# Patient Record
Sex: Female | Born: 1985 | Race: Black or African American | Hispanic: No | Marital: Single | Smoking: Current every day smoker
Health system: Southern US, Community
[De-identification: ages and names within clinical notes are randomized; demographics above are authoritative.]

## PROBLEM LIST (undated history)

## (undated) DIAGNOSIS — K8000 Calculus of gallbladder with acute cholecystitis without obstruction: Secondary | ICD-10-CM

## (undated) DIAGNOSIS — R002 Palpitations: Secondary | ICD-10-CM

## (undated) DIAGNOSIS — F329 Major depressive disorder, single episode, unspecified: Secondary | ICD-10-CM

## (undated) DIAGNOSIS — I10 Essential (primary) hypertension: Secondary | ICD-10-CM

## (undated) DIAGNOSIS — F32A Depression, unspecified: Secondary | ICD-10-CM

## (undated) HISTORY — DX: Calculus of gallbladder with acute cholecystitis without obstruction: K80.00

## (undated) HISTORY — DX: Depression, unspecified: F32.A

## (undated) HISTORY — DX: Major depressive disorder, single episode, unspecified: F32.9

## (undated) HISTORY — DX: Palpitations: R00.2

## (undated) HISTORY — PX: NO PAST SURGERIES: SHX2092

---
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
## 2006-01-29 ENCOUNTER — Ambulatory Visit (HOSPITAL_COMMUNITY): Admission: RE | Admit: 2006-01-29 | Discharge: 2006-01-29 | Payer: Self-pay | Admitting: Family Medicine

## 2006-05-04 ENCOUNTER — Emergency Department (HOSPITAL_COMMUNITY): Admission: EM | Admit: 2006-05-04 | Discharge: 2006-05-05 | Payer: Self-pay | Admitting: Emergency Medicine

## 2006-05-05 ENCOUNTER — Emergency Department (HOSPITAL_COMMUNITY): Admission: EM | Admit: 2006-05-05 | Discharge: 2006-05-06 | Payer: Self-pay | Admitting: Emergency Medicine

## 2006-08-26 ENCOUNTER — Inpatient Hospital Stay (HOSPITAL_COMMUNITY): Admission: AD | Admit: 2006-08-26 | Discharge: 2006-08-29 | Payer: Self-pay | Admitting: Obstetrics and Gynecology

## 2010-07-19 NOTE — H&P (Signed)
NAMEMORINE, KOHLMAN           ACCOUNT NO.:  [id number]   MEDICAL RECORD NO.:  [id number]          PATIENT TYPE:  INP   LOCATION:  A402                          FACILITY:  [facility]   PHYSICIAN:  Tilda Burrow, M.D. DATE OF BIRTH:  [birth year]   DATE OF ADMISSION:  08/26/2006  DATE OF DISCHARGE:  LH                              HISTORY & PHYSICAL   ADMITTING DIAGNOSES:  1. Pregnancy, 38 weeks 6 days.  2. Prodromal labor symptoms, admitted for induction of labor, elective      as per patient request.   HPI:  This is a 25 year old female gravida 1, para 0, LMP January 28, 2006, placing [facility] November 05, 2006, with corrected [facility] based on 9 week  ultrasound of September 03, 2006, at 9 weeks 2 days and confirmed with her 21  week ultrasound suggesting an [facility] of August 27, 2006.  She is 39 weeks by  best criteria, 40 weeks by secondary criteria.  She is admitted after  presenting complaining of contractions and pelvic pressure.  The cervix  has softened up over the past 3 days since last prenatal visit.  Patient  is desirous of delivering at this time and given the cervical changes  and the pressure symptoms that she is having we agreed to proceed with  induction of labor.  The patient is aware that the usual complications  of labor can occur with spontaneous or induced labors.   PAST MEDICAL HISTORY:  1. Positive for anemia.  2. History of abnormal Pap.   SURGICAL HISTORY:  Negative.   ALLERGIES:  None.   SOCIAL HISTORY:  1. Single.  2. Works at [employer] in [place].   Height 5 feet 7 inches, weight 308, which is a 10 pound weight gain  during the pregnancy.  Blood pressure 126/90, fundal height term sized  fetus, estimated fetal weight 7-1/2 to 8 pounds, cervix fingertip, long,  -2, vertex well applied and softer than last week.   PRENATAL LABS:  Blood type O positive, urine drug screen negative,  rubella immune.  Hemoglobin 12, hematocrit 37.  Hepatitis, HIV, RPR, GC  and  Chlamydia all negative.  Pap smear showed LSIL with negative  colposcopy.  She is Group B Strep positive.  Has a 130 mg/dL glucose  tolerance test and is Sickledex negative.   IMPRESSION:  1. Pregnancy, 38 weeks 6 days.  2. Elective induction of labor.   PLAN:  1. Foley bulb on the afternoon of August 26, 2006.  2. Will consider proceeding with Pitocin on the evening of June 22 or      early on August 27, 2006.   ADDENDUM:  Patient has a boy.  Plans to bottle feed.  Plans Depo-Provera  and __________ , her partner, is not currently with her.      Tilda Burrow, M.D.  Electronically Signed     JVF/MEDQ  D:  08/27/2006  T:  08/27/2006  Job:  [id number]   cc:   Francoise Schaumann. Milford Cage DO, FAAP  Fax: [phone]

## 2010-07-19 NOTE — Op Note (Signed)
Kimberly Patrick, SWIER           ACCOUNT NO.:  [id number]   MEDICAL RECORD NO.:  [id number]          PATIENT TYPE:  INP   LOCATION:  LDR4                          FACILITY:  [facility]   PHYSICIAN:  Tilda Burrow, M.D. DATE OF BIRTH:  17-Apr-1985   DATE OF PROCEDURE:  08/27/2006  DATE OF DISCHARGE:                               OPERATIVE REPORT   DELIVERY TIME:  1:15 p.m. approximately.   Iliani progressed through the labor without an epidural.  She had Foley  bulb through the night and Pitocin begun at 4 a.m.  The Foley bulb had  stretched the cervix 4 cm.  She made slow progressive.  At 7:30, she was  still 450, -2, mid position, vertex well applied, and the membranes were  ruptured.  She was beginning to give up on herself, but she progressed  nevertheless, made excellent progress after 8 cm, reached complete  dilated and coached for less than an hour, delivering over a small  second-degree episiotomy.  She had had a very firm posterior hymen  remnant that required transection.  She then was able to push the baby  out over a single contraction with a small second-degree extension of  the episiotomy.  There was a healthy female infant, Apgars 9 and 9.  The  placenta was delivered after cord blood samples obtained.  Three-vessel  cord was confirmed.  Delivery showed a Duncan presentation, but  membranes were intact.  The EBL was 450 mL.  The episiotomy and  extension were second-degree and were easily repaired under local  anesthesia using 2-0 Vicryl in a continuous running fashion with good  tissue at its approximation.  The patient tolerated the procedure well,  went to recovery room in good condition.  Sponge and needle counts  correct.      Tilda Burrow, M.D.  Electronically Signed     JVF/MEDQ  D:  08/27/2006  T:  08/27/2006  Job:  [id number]   cc:   Dr. Stephania Fragmin

## 2010-10-12 ENCOUNTER — Encounter: Payer: Self-pay | Admitting: *Deleted

## 2010-10-12 ENCOUNTER — Emergency Department (HOSPITAL_COMMUNITY)
Admission: EM | Admit: 2010-10-12 | Discharge: 2010-10-13 | Disposition: A | Discharge status: Home / Self Care | Payer: Medicaid Other | Attending: Emergency Medicine | Admitting: Emergency Medicine

## 2010-10-12 DIAGNOSIS — R110 Nausea: Secondary | ICD-10-CM | POA: Insufficient documentation

## 2010-10-12 DIAGNOSIS — F17200 Nicotine dependence, unspecified, uncomplicated: Secondary | ICD-10-CM | POA: Insufficient documentation

## 2010-10-12 DIAGNOSIS — R1084 Generalized abdominal pain: Secondary | ICD-10-CM | POA: Insufficient documentation

## 2010-10-12 DIAGNOSIS — M549 Dorsalgia, unspecified: Secondary | ICD-10-CM | POA: Insufficient documentation

## 2010-10-12 NOTE — ED Notes (Signed)
Patient with sudden lower abd. Pain "feels like knots" and c/o lower back pain, denies burning on urination, +vomiting x 5 in 2 hours

## 2010-10-13 ENCOUNTER — Emergency Department (HOSPITAL_COMMUNITY): Payer: Medicaid Other

## 2010-10-13 LAB — URINALYSIS, ROUTINE W REFLEX MICROSCOPIC
Nitrite: NEGATIVE
Protein, ur: NEGATIVE mg/dL
Urobilinogen, UA: 0.2 mg/dL (ref 0.0–1.0)
pH: 8.5 — ABNORMAL HIGH (ref 5.0–8.0)

## 2010-10-13 MED ORDER — ONDANSETRON HCL 4 MG/2ML IJ SOLN
4.0000 mg | Freq: Once | INTRAMUSCULAR | Status: AC
  Administered 2010-10-13: 4 mg via INTRAVENOUS
  Filled 2010-10-13: qty 2

## 2010-10-13 MED ORDER — SODIUM CHLORIDE 0.9 % IV BOLUS (SEPSIS)
1000.0000 mL | Freq: Once | INTRAVENOUS | Status: AC
  Administered 2010-10-13: 1000 mL via INTRAVENOUS

## 2010-10-13 NOTE — ED Provider Notes (Signed)
History     CSN: [id number] Arrival date & time: 10/12/2010 11:26 PM  Chief Complaint  Patient presents with  . Abdominal Pain  . Back Pain   Patient is a 25 y.o. female presenting with abdominal pain and back pain. The history is provided by the patient.  Abdominal Pain The primary symptoms of the illness include abdominal pain and nausea. The current episode started 3 to 5 hours ago. The onset of the illness was gradual.  The abdominal pain is located in the suprapubic region. The abdominal pain radiates to the back. The severity of the abdominal pain is 10/10 (currently painfree). The abdominal pain is relieved by nothing.  Nausea began today.  The patient states that she believes she is currently not pregnant. The patient has not had a change in bowel habit (goes every two days, last BM yesterday). Additional symptoms associated with the illness include back pain.  Back Pain  Associated symptoms include abdominal pain.    History reviewed. No pertinent past medical history.  History reviewed. No pertinent past surgical history.  History reviewed. No pertinent family history.  History  Substance Use Topics  . Smoking status: Current Everyday Smoker -- 0.5 packs/day    Types: Cigarettes  . Smokeless tobacco: Not on file  . Alcohol Use: Yes     occ. use    OB History    Grav Para Term Preterm Abortions TAB SAB Ect Mult Living                  Review of Systems  Gastrointestinal: Positive for nausea and abdominal pain.  Musculoskeletal: Positive for back pain.    Physical Exam  BP 134/84  Pulse 78  Temp(Src) 97.4 F (36.3 C) (Oral)  Resp 18  Ht 5' 6.5" (1.689 m)  Wt 240 lb (108.863 kg)  BMI 38.16 kg/m2  SpO2 100%  LMP 10/04/2010  Physical Exam  ED Course  Procedures  MDM Reviewed labs, xray results, nurse notes, vital signs. Reviewed results with patient. Gave her a paper copy of the films.      Nicoletta Dress. Colon Branch, MD 10/13/10 [phone]

## 2010-12-21 LAB — DIFFERENTIAL
Blasts: 0
Eosinophils Relative: 1
Promyelocytes Absolute: 0

## 2010-12-21 LAB — CORD BLOOD GAS (ARTERIAL)
Bicarbonate: 25.8 — ABNORMAL HIGH
TCO2: 24.2
pCO2 cord blood (arterial): 60.1 — ABNORMAL HIGH
pO2 cord blood: 13.4 — ABNORMAL LOW

## 2010-12-21 LAB — ABO/RH: ABO/RH(D): O POS

## 2010-12-21 LAB — CBC
HCT: 36.1
MCHC: 32.7
MCV: 85.1
RBC: 4.24
RDW: 15.1 — ABNORMAL HIGH
WBC: 12.1 — ABNORMAL HIGH

## 2011-11-08 ENCOUNTER — Encounter (HOSPITAL_COMMUNITY): Payer: Self-pay

## 2011-11-08 ENCOUNTER — Emergency Department (HOSPITAL_COMMUNITY)
Admission: EM | Admit: 2011-11-08 | Discharge: 2011-11-08 | Disposition: A | Discharge status: Home / Self Care | Payer: Self-pay | Attending: Emergency Medicine | Admitting: Emergency Medicine

## 2011-11-08 ENCOUNTER — Emergency Department (HOSPITAL_COMMUNITY): Payer: Self-pay

## 2011-11-08 DIAGNOSIS — R112 Nausea with vomiting, unspecified: Secondary | ICD-10-CM | POA: Insufficient documentation

## 2011-11-08 DIAGNOSIS — R197 Diarrhea, unspecified: Secondary | ICD-10-CM | POA: Insufficient documentation

## 2011-11-08 DIAGNOSIS — R109 Unspecified abdominal pain: Secondary | ICD-10-CM | POA: Insufficient documentation

## 2011-11-08 LAB — URINALYSIS, ROUTINE W REFLEX MICROSCOPIC
Leukocytes, UA: NEGATIVE
Nitrite: NEGATIVE
Specific Gravity, Urine: >1.03 — ABNORMAL HIGH (ref 1.005–1.030)

## 2011-11-08 LAB — COMPREHENSIVE METABOLIC PANEL
ALT: 10 U/L (ref 0–35)
Albumin: 3.4 g/dL — ABNORMAL LOW (ref 3.5–5.2)
Alkaline Phosphatase: 67 U/L (ref 39–117)
CO2: 29 mEq/L (ref 19–32)
Calcium: 9.5 mg/dL (ref 8.4–10.5)
Creatinine, Ser: 0.82 mg/dL (ref 0.50–1.10)
GFR calc Af Amer: >90 mL/min (ref >90)
Glucose, Bld: 94 mg/dL (ref 70–99)
Total Bilirubin: 0.2 mg/dL — ABNORMAL LOW (ref 0.3–1.2)

## 2011-11-08 LAB — URINE MICROSCOPIC-ADD ON

## 2011-11-08 LAB — CBC WITH DIFFERENTIAL/PLATELET
Basophils Relative: 0 % (ref 0–1)
Hemoglobin: 11.3 g/dL — ABNORMAL LOW (ref 12.0–15.0)
Lymphocytes Relative: 32 % (ref 12–46)
Lymphs Abs: 3.5 10*3/uL (ref 0.7–4.0)
MCHC: 31.9 g/dL (ref 30.0–36.0)
Monocytes Relative: 4 % (ref 3–12)
Neutrophils Relative %: 63 % (ref 43–77)
RBC: 4.26 MIL/uL (ref 3.87–5.11)
WBC: 10.9 10*3/uL — ABNORMAL HIGH (ref 4.0–10.5)

## 2011-11-08 LAB — PREGNANCY, URINE: Preg Test, Ur: NEGATIVE

## 2011-11-08 MED ORDER — SODIUM CHLORIDE 0.9 % IV SOLN
INTRAVENOUS | Status: DC
  Administered 2011-11-08: 150 mL/h via INTRAVENOUS

## 2011-11-08 MED ORDER — MORPHINE SULFATE 2 MG/ML IJ SOLN
2.0000 mg | INTRAMUSCULAR | Status: DC | PRN
  Administered 2011-11-08: 2 mg via INTRAVENOUS
  Filled 2011-11-08: qty 1

## 2011-11-08 MED ORDER — HYDROCODONE-ACETAMINOPHEN 5-325 MG PO TABS: ORAL_TABLET | ORAL | Status: AC

## 2011-11-08 MED ORDER — ONDANSETRON HCL 4 MG/2ML IJ SOLN
4.0000 mg | INTRAMUSCULAR | Status: DC | PRN
  Administered 2011-11-08: 4 mg via INTRAVENOUS
  Filled 2011-11-08: qty 2

## 2011-11-08 MED ORDER — IOHEXOL 300 MG/ML  SOLN
100.0000 mL | Freq: Once | INTRAMUSCULAR | Status: AC | PRN
  Administered 2011-11-08: 100 mL via INTRAVENOUS

## 2011-11-08 MED ORDER — ONDANSETRON HCL 4 MG PO TABS: 4.0000 mg | ORAL_TABLET | Freq: Three times a day (TID) | ORAL | Status: AC | PRN

## 2011-11-08 NOTE — ED Notes (Signed)
Has completed first bottle of contrast liquid - ready to start on second bottle

## 2011-11-08 NOTE — ED Notes (Signed)
C/o diffuse intermittent abd pain x 3 weeks; states pain is worse at night; reports her pain is presently 10/10; pt is alert, smiling, in no distress; states, "I haven't had pain this bad since I had my baby.".  Denies vomiting; c/o nausea and one episode of diarrhea today.  LMP 11/04/11.

## 2011-11-08 NOTE — ED Notes (Signed)
Pt report ab pain for 2 weeks,  +diarrhea today. Denies any urinary s/ s

## 2011-11-08 NOTE — ED Provider Notes (Signed)
History     CSN: [id number]  Arrival date & time 11/08/11  1801   First MD Initiated Contact with Patient 11/08/11 1809      Chief Complaint  Patient presents with  . Abdominal Pain    HPI Pt was seen at 1915.  Per pt, c/o gradual onset and persistence of constant generalized abd "pain" for the past 2 weeks.  Has been associated with multiple intermittent episodes of N/V/D.  Denies back pain, no CP/SOB, no cough, no black or blood in stools or emesis, no dysuria/hematuria, no vaginal bleeding/discharge.    History reviewed. No pertinent past medical history.  History reviewed. No pertinent past surgical history.   History  Substance Use Topics  . Smoking status: Current Everyday Smoker -- 0.5 packs/day    Types: Cigarettes  . Smokeless tobacco: Not on file  . Alcohol Use: Yes     occ. use      Review of Systems ROS: Statement: All systems negative except as marked or noted in the HPI; Constitutional: Negative for fever and chills. ; ; Eyes: Negative for eye pain, redness and discharge. ; ; ENMT: Negative for ear pain, hoarseness, nasal congestion, sinus pressure and sore throat. ; ; Cardiovascular: Negative for chest pain, palpitations, diaphoresis, dyspnea and peripheral edema. ; ; Respiratory: Negative for cough, wheezing and stridor. ; ; Gastrointestinal: +N/V/D, abd pain. Negative for blood in stool, hematemesis, jaundice and rectal bleeding. . ; ; Genitourinary: Negative for dysuria, flank pain and hematuria. ; ; Musculoskeletal: Negative for back pain and neck pain. Negative for swelling and trauma.; ; Skin: Negative for pruritus, rash, abrasions, blisters, bruising and skin lesion.; ; Neuro: Negative for headache, lightheadedness and neck stiffness. Negative for weakness, altered level of consciousness , altered mental status, extremity weakness, paresthesias, involuntary movement, seizure and syncope.       Allergies  Review of patient's allergies indicates no known  allergies.  Home Medications  No current outpatient prescriptions on file.  BP 141/100  Pulse 93  Temp 97.9 F (36.6 C) (Oral)  Resp 20  SpO2 100%  LMP 11/03/2011  Physical Exam 1920: Physical examination:  Nursing notes reviewed; Vital signs and O2 SAT reviewed;  Constitutional: Well developed, Well nourished, Well hydrated, In no acute distress; Head:  Normocephalic, atraumatic; Eyes: EOMI, PERRL, No scleral icterus; ENMT: Mouth and pharynx normal, Mucous membranes moist; Neck: Supple, Full range of motion, No lymphadenopathy; Cardiovascular: Regular rate and rhythm, No murmur, rub, or gallop; Respiratory: Breath sounds clear & equal bilaterally, No rales, rhonchi, wheezes.  Speaking full sentences with ease, Normal respiratory effort/excursion; Chest: Nontender, Movement normal; Abdomen: Soft, +mild diffuse tenderness to palp.  No rebound or guarding. Nondistended, Normal bowel sounds; Genitourinary: No CVA tenderness; Extremities: Pulses normal, No tenderness, No edema, No calf edema or asymmetry.; Neuro: AA&Ox3, Major CN grossly intact.  Speech clear. No gross focal motor or sensory deficits in extremities.; Skin: Color normal, Warm, Dry.   ED Course  Procedures   MDM  MDM Reviewed: nursing note and vitals Interpretation: labs and CT scan   Results for orders placed during the hospital encounter of 11/08/11  PREGNANCY, URINE      Component Value Range   Preg Test, Ur NEGATIVE  NEGATIVE  URINALYSIS, ROUTINE W REFLEX MICROSCOPIC      Component Value Range   Color, Urine YELLOW  YELLOW   APPearance CLEAR  CLEAR   Specific Gravity, Urine >1.030 (*) 1.005 - 1.030   pH 6.0  5.0 -  8.0   Glucose, UA NEGATIVE  NEGATIVE mg/dL   Hgb urine dipstick SMALL (*) NEGATIVE   Bilirubin Urine SMALL (*) NEGATIVE   Ketones, ur TRACE (*) NEGATIVE mg/dL   Protein, ur TRACE (*) NEGATIVE mg/dL   Urobilinogen, UA 0.2  0.0 - 1.0 mg/dL   Nitrite NEGATIVE  NEGATIVE   Leukocytes, UA NEGATIVE   NEGATIVE  URINE MICROSCOPIC-ADD ON      Component Value Range   Squamous Epithelial / LPF RARE  RARE   WBC, UA 0-2  <3 WBC/hpf   RBC / HPF 0-2  <3 RBC/hpf   Bacteria, UA FEW (*) RARE   Urine-Other MUCOUS PRESENT    CBC WITH DIFFERENTIAL      Component Value Range   WBC 10.9 (*) 4.0 - 10.5 K/uL   RBC 4.26  3.87 - 5.11 MIL/uL   Hemoglobin 11.3 (*) 12.0 - 15.0 g/dL   HCT 96.0 (*) 45.4 - 09.8 %   MCV 83.1  78.0 - 100.0 fL   MCH 26.5  26.0 - 34.0 pg   MCHC 31.9  30.0 - 36.0 g/dL   RDW 11.9 (*) 14.7 - 82.9 %   Platelets 292  150 - 400 K/uL   Neutrophils Relative 63  43 - 77 %   Neutro Abs 6.9  1.7 - 7.7 K/uL   Lymphocytes Relative 32  12 - 46 %   Lymphs Abs 3.5  0.7 - 4.0 K/uL   Monocytes Relative 4  3 - 12 %   Monocytes Absolute 0.4  0.1 - 1.0 K/uL   Eosinophils Relative 1  0 - 5 %   Eosinophils Absolute 0.1  0.0 - 0.7 K/uL   Basophils Relative 0  0 - 1 %   Basophils Absolute 0.0  0.0 - 0.1 K/uL  COMPREHENSIVE METABOLIC PANEL      Component Value Range   Sodium 138  135 - 145 mEq/L   Potassium 3.7  3.5 - 5.1 mEq/L   Chloride 100  96 - 112 mEq/L   CO2 29  19 - 32 mEq/L   Glucose, Bld 94  70 - 99 mg/dL   BUN 8  6 - 23 mg/dL   Creatinine, Ser 5.62  0.50 - 1.10 mg/dL   Calcium 9.5  8.4 - 13.0 mg/dL   Total Protein 7.5  6.0 - 8.3 g/dL   Albumin 3.4 (*) 3.5 - 5.2 g/dL   AST 14  0 - 37 U/L   ALT 10  0 - 35 U/L   Alkaline Phosphatase 67  39 - 117 U/L   Total Bilirubin 0.2 (*) 0.3 - 1.2 mg/dL   GFR calc non Af Amer >90  >90 mL/min   GFR calc Af Amer >90  >90 mL/min  LIPASE, BLOOD      Component Value Range   Lipase 24  11 - 59 U/L   Ct Abdomen Pelvis W Contrast 11/08/2011  *RADIOLOGY REPORT*  Clinical Data: All over abdominal pain especially periumbilical, nausea, vomiting, diarrhea  CT ABDOMEN AND PELVIS WITH CONTRAST  Technique:  Multidetector CT imaging of the abdomen and pelvis was performed following the standard protocol during bolus administration of intravenous contrast.  Sagittal and coronal MPR images reconstructed from axial data set.  Contrast: OMNIPAQUE IOHEXOL 300 MG/ML  SOLN Dilute oral contrast.  Comparison: None  Findings: Lung bases clear. Liver, spleen, pancreas, kidneys, and adrenal glands normal appearance. Normal appendix, uterus, adnexae, bladder. Questionable subtle wall thickening of the gallbladder versus underdistension.  Bilateral pelvic phleboliths. Stomach and bowel loops normal appearance. No mass, adenopathy, free fluid, or free air. No acute osseous findings.  IMPRESSION: Questionable subtle wall thickening of gallbladder versus a partially contracted state. If patient has symptoms suggesting gallbladder disease consider follow-up abdominal ultrasound imaging in A.M. No additional intra abdominal or intrapelvic abnormalities identified.   Original Report Authenticated By: Lollie Marrow, M.D.        2225:   VSS, NAD, resps easy.  No N/V or stooling while in the ED.  LFT's normal, afebrile.  No specific findings of acute cholecystitis at this time (or need for admission); will have pt return for US GB in the morning after remaining NPO after midnight.  Dx testing d/w pt and family.  Questions answered.  Verb understanding, agreeable to d/c home with outpt f/u tomorrow morning at 7:15am for a 7:30am appointment in Radiology to have an [place] to further evaluate her gallbladder.         Laray Anger, DO 11/10/11 Aldona Lento

## 2011-11-09 ENCOUNTER — Other Ambulatory Visit (HOSPITAL_COMMUNITY): Payer: Self-pay | Admitting: Emergency Medicine

## 2011-11-09 ENCOUNTER — Ambulatory Visit (HOSPITAL_COMMUNITY)
Admit: 2011-11-09 | Discharge: 2011-11-09 | Disposition: A | Discharge status: Home / Self Care | Payer: Self-pay | Source: Ambulatory Visit | Attending: Emergency Medicine | Admitting: Emergency Medicine

## 2011-11-09 DIAGNOSIS — R109 Unspecified abdominal pain: Secondary | ICD-10-CM | POA: Insufficient documentation

## 2011-11-09 DIAGNOSIS — R932 Abnormal findings on diagnostic imaging of liver and biliary tract: Secondary | ICD-10-CM | POA: Insufficient documentation

## 2011-11-09 NOTE — Progress Notes (Signed)
9:05 AM Pt had ultrasound of abdomen, which showed multiple gall stones.  Advised her she would need to see a general surgeon to discuss having cholecystectomy.  RN gave pt name of Dr. Franky Macho, surgeon on call today.

## 2012-03-06 NOTE — L&D Delivery Note (Signed)
Delivery Note Pt pushed well and at 11:41 PM a viable female was delivered via Vaginal, Spontaneous Delivery (Presentation: ;  ).  APGAR: 9, 9 ; weight: 6+12.6 .  Infant placed on pt's abd; dried. Cord clamped and cut by pt's mother. Hospital cord blood sample collected. Placenta status: Intact, Spontaneous.  Cord: 3 vessel.   Anesthesia: Epidural  Episiotomy: none Lacerations: none Est. Blood Loss (mL): 200  Mom to postpartum.  Baby to nursery-stable.  Nash Bolls 11/09/2012, 12:05 AM

## 2012-03-06 NOTE — L&D Delivery Note (Signed)
Attestation of Attending Supervision of Advanced Practitioner (PA/CNM/NP): Evaluation and management procedures were performed by the Advanced Practitioner under my supervision and collaboration.  I have reviewed the Advanced Practitioner's note and chart, and I agree with the management and plan.  Daulton Harbaugh, MD, FACOG Attending Obstetrician & Gynecologist Faculty Practice, [facility]  

## 2012-03-28 DIAGNOSIS — K8000 Calculus of gallbladder with acute cholecystitis without obstruction: Secondary | ICD-10-CM

## 2012-03-28 HISTORY — DX: Calculus of gallbladder with acute cholecystitis without obstruction: K80.00

## 2012-03-28 LAB — OB RESULTS CONSOLE HGB/HCT, BLOOD: Hemoglobin: 10.8 g/dL

## 2012-03-28 LAB — CYSTIC FIBROSIS DIAGNOSTIC STUDY: Interpretation-CFDNA:: NEGATIVE

## 2012-03-28 LAB — OB RESULTS CONSOLE VARICELLA ZOSTER ANTIBODY, IGG: Varicella: IMMUNE

## 2012-03-28 LAB — OB RESULTS CONSOLE HIV ANTIBODY (ROUTINE TESTING): HIV: NONREACTIVE

## 2012-03-28 LAB — OB RESULTS CONSOLE RUBELLA ANTIBODY, IGM: Rubella: IMMUNE

## 2012-04-23 ENCOUNTER — Encounter: Payer: Self-pay | Admitting: Advanced Practice Midwife

## 2012-04-23 DIAGNOSIS — K8000 Calculus of gallbladder with acute cholecystitis without obstruction: Secondary | ICD-10-CM | POA: Insufficient documentation

## 2012-05-23 ENCOUNTER — Encounter: Payer: Self-pay | Admitting: Advanced Practice Midwife

## 2012-05-23 ENCOUNTER — Other Ambulatory Visit: Payer: Self-pay | Admitting: Advanced Practice Midwife

## 2012-05-23 ENCOUNTER — Ambulatory Visit (INDEPENDENT_AMBULATORY_CARE_PROVIDER_SITE_OTHER): Payer: Medicaid Other | Admitting: Advanced Practice Midwife

## 2012-05-23 VITALS — BP 98/70 | Wt 267.0 lb

## 2012-05-23 DIAGNOSIS — O99340 Other mental disorders complicating pregnancy, unspecified trimester: Secondary | ICD-10-CM

## 2012-05-23 DIAGNOSIS — Z3482 Encounter for supervision of other normal pregnancy, second trimester: Secondary | ICD-10-CM

## 2012-05-23 DIAGNOSIS — Z331 Pregnant state, incidental: Secondary | ICD-10-CM

## 2012-05-23 DIAGNOSIS — O99019 Anemia complicating pregnancy, unspecified trimester: Secondary | ICD-10-CM

## 2012-05-23 DIAGNOSIS — Z1389 Encounter for screening for other disorder: Secondary | ICD-10-CM

## 2012-05-23 DIAGNOSIS — E669 Obesity, unspecified: Secondary | ICD-10-CM

## 2012-05-23 NOTE — Progress Notes (Signed)
No c/o at this time.  Routine questions about pregnancy andswered.  F/U in 4 weeks for anatomy scan.  2nd IT today.  No problem with gallstones.

## 2012-05-30 LAB — MATERNAL SCREEN, INTEGRATED #2
Age risk Down Syndrome: 1:940 {titer}
Crown Rump Length: 68.4 mm
Estriol Mom: 0.94
Estriol, Free: 0.36 ng/mL
Inhibin A Dimeric: 341 pg/mL
Inhibin A MoM: 2.61
MSS Down Syndrome: 1:970 {titer}
MSS Trisomy 18 Risk: <1:5000 {titer}
Maternal weight: 267 [lb_av]
Nuchal Translucency: 1.52 mm
Number of fetuses: 1
PAPP-A MoM: 1.25
PAPP-A: 1142 ng/mL
Referring Physician Phone: 3363426063
Rish for ONTD: <1:5000 {titer}

## 2012-06-20 ENCOUNTER — Ambulatory Visit (INDEPENDENT_AMBULATORY_CARE_PROVIDER_SITE_OTHER): Payer: Medicaid Other

## 2012-06-20 ENCOUNTER — Encounter: Payer: Self-pay | Admitting: Advanced Practice Midwife

## 2012-06-20 ENCOUNTER — Ambulatory Visit (INDEPENDENT_AMBULATORY_CARE_PROVIDER_SITE_OTHER): Payer: Medicaid Other | Admitting: Advanced Practice Midwife

## 2012-06-20 VITALS — BP 120/70 | Wt 263.0 lb

## 2012-06-20 DIAGNOSIS — Z3482 Encounter for supervision of other normal pregnancy, second trimester: Secondary | ICD-10-CM

## 2012-06-20 DIAGNOSIS — Z3480 Encounter for supervision of other normal pregnancy, unspecified trimester: Secondary | ICD-10-CM

## 2012-06-20 DIAGNOSIS — Z1389 Encounter for screening for other disorder: Secondary | ICD-10-CM

## 2012-06-20 DIAGNOSIS — Z331 Pregnant state, incidental: Secondary | ICD-10-CM

## 2012-06-20 DIAGNOSIS — O99340 Other mental disorders complicating pregnancy, unspecified trimester: Secondary | ICD-10-CM

## 2012-06-20 DIAGNOSIS — E669 Obesity, unspecified: Secondary | ICD-10-CM

## 2012-06-20 LAB — POCT URINALYSIS DIPSTICK
Blood, UA: NEGATIVE
Glucose, UA: NEGATIVE
Ketones, UA: 3
Leukocytes, UA: NEGATIVE
Nitrite, UA: NEGATIVE

## 2012-06-20 NOTE — Progress Notes (Signed)
U/S(19+4wks)-active fetus, meas c/w dates, cx long and closed, fluid wnl, post Gr 0 plac, no obvious abnl noted, female Fetus, bilateral adnexa wnl

## 2012-06-20 NOTE — Progress Notes (Signed)
Occ pain from gallbladder.  Routine questions about pregnancy answered.  F/U in 4 weeks for LROB.

## 2012-06-20 NOTE — Progress Notes (Signed)
Pain on right side

## 2012-06-26 LAB — US OB COMP + 14 WK

## 2012-07-18 ENCOUNTER — Encounter: Payer: Self-pay | Admitting: Advanced Practice Midwife

## 2012-07-18 ENCOUNTER — Ambulatory Visit (INDEPENDENT_AMBULATORY_CARE_PROVIDER_SITE_OTHER): Payer: Medicaid Other | Admitting: Advanced Practice Midwife

## 2012-07-18 VITALS — BP 110/78 | Wt 264.0 lb

## 2012-07-18 DIAGNOSIS — Z1389 Encounter for screening for other disorder: Secondary | ICD-10-CM

## 2012-07-18 DIAGNOSIS — O99210 Obesity complicating pregnancy, unspecified trimester: Secondary | ICD-10-CM

## 2012-07-18 DIAGNOSIS — O99340 Other mental disorders complicating pregnancy, unspecified trimester: Secondary | ICD-10-CM

## 2012-07-18 DIAGNOSIS — Z331 Pregnant state, incidental: Secondary | ICD-10-CM

## 2012-07-18 DIAGNOSIS — Z3482 Encounter for supervision of other normal pregnancy, second trimester: Secondary | ICD-10-CM

## 2012-07-18 LAB — POCT URINALYSIS DIPSTICK
Glucose, UA: NEGATIVE
Nitrite, UA: NEGATIVE

## 2012-07-18 NOTE — Progress Notes (Signed)
No c/o at this time.  Routine questions about pregnancy answered.  F/U in 4 weeks for PN2/LROB.  

## 2012-07-18 NOTE — Progress Notes (Signed)
Pt having seasonal allergies. Advised Zyrtec or Benadryl with drowsy precautions.

## 2012-07-18 NOTE — Patient Instructions (Signed)
Nothing to eat or drink after midnight before your next appointment & plan to be here for 2 hours (for your sugar test).  

## 2012-07-27 ENCOUNTER — Encounter (HOSPITAL_COMMUNITY): Payer: Self-pay | Admitting: *Deleted

## 2012-07-27 ENCOUNTER — Emergency Department (HOSPITAL_COMMUNITY)
Admission: EM | Admit: 2012-07-27 | Discharge: 2012-07-28 | Disposition: A | Discharge status: Home / Self Care | Payer: Medicaid Other | Attending: Emergency Medicine | Admitting: Emergency Medicine

## 2012-07-27 DIAGNOSIS — K8050 Calculus of bile duct without cholangitis or cholecystitis without obstruction: Secondary | ICD-10-CM

## 2012-07-27 DIAGNOSIS — O9989 Other specified diseases and conditions complicating pregnancy, childbirth and the puerperium: Secondary | ICD-10-CM | POA: Insufficient documentation

## 2012-07-27 DIAGNOSIS — R1013 Epigastric pain: Secondary | ICD-10-CM | POA: Insufficient documentation

## 2012-07-27 DIAGNOSIS — M549 Dorsalgia, unspecified: Secondary | ICD-10-CM | POA: Insufficient documentation

## 2012-07-27 DIAGNOSIS — K8020 Calculus of gallbladder without cholecystitis without obstruction: Secondary | ICD-10-CM | POA: Insufficient documentation

## 2012-07-27 DIAGNOSIS — Z8679 Personal history of other diseases of the circulatory system: Secondary | ICD-10-CM | POA: Insufficient documentation

## 2012-07-27 DIAGNOSIS — R300 Dysuria: Secondary | ICD-10-CM | POA: Insufficient documentation

## 2012-07-27 DIAGNOSIS — R1011 Right upper quadrant pain: Secondary | ICD-10-CM

## 2012-07-27 DIAGNOSIS — Z8659 Personal history of other mental and behavioral disorders: Secondary | ICD-10-CM | POA: Insufficient documentation

## 2012-07-27 DIAGNOSIS — O212 Late vomiting of pregnancy: Secondary | ICD-10-CM | POA: Insufficient documentation

## 2012-07-27 DIAGNOSIS — O99330 Smoking (tobacco) complicating pregnancy, unspecified trimester: Secondary | ICD-10-CM | POA: Insufficient documentation

## 2012-07-27 DIAGNOSIS — R3915 Urgency of urination: Secondary | ICD-10-CM | POA: Insufficient documentation

## 2012-07-27 NOTE — ED Notes (Signed)
Pt reporting back and lower abdominal pain, with difficulty of urination.  Pt reports being 5 months pregnant. Denies bleeding or vaginal discharge.

## 2012-07-28 ENCOUNTER — Ambulatory Visit (HOSPITAL_COMMUNITY)
Admit: 2012-07-28 | Discharge: 2012-07-28 | Disposition: A | Discharge status: Home / Self Care | Payer: Medicaid Other | Source: Ambulatory Visit | Attending: Emergency Medicine | Admitting: Emergency Medicine

## 2012-07-28 DIAGNOSIS — R1011 Right upper quadrant pain: Secondary | ICD-10-CM | POA: Insufficient documentation

## 2012-07-28 DIAGNOSIS — R112 Nausea with vomiting, unspecified: Secondary | ICD-10-CM | POA: Insufficient documentation

## 2012-07-28 DIAGNOSIS — K7689 Other specified diseases of liver: Secondary | ICD-10-CM | POA: Insufficient documentation

## 2012-07-28 DIAGNOSIS — O99891 Other specified diseases and conditions complicating pregnancy: Secondary | ICD-10-CM | POA: Insufficient documentation

## 2012-07-28 DIAGNOSIS — K8020 Calculus of gallbladder without cholecystitis without obstruction: Secondary | ICD-10-CM | POA: Insufficient documentation

## 2012-07-28 LAB — URINALYSIS, ROUTINE W REFLEX MICROSCOPIC
Leukocytes, UA: NEGATIVE
Nitrite: NEGATIVE
Protein, ur: 30 mg/dL — AB
Specific Gravity, Urine: >1.03 — ABNORMAL HIGH (ref 1.005–1.030)
Urobilinogen, UA: 1 mg/dL (ref 0.0–1.0)

## 2012-07-28 LAB — URINE MICROSCOPIC-ADD ON

## 2012-07-28 LAB — COMPREHENSIVE METABOLIC PANEL
ALT: 13 U/L (ref 0–35)
Alkaline Phosphatase: 121 U/L — ABNORMAL HIGH (ref 39–117)
CO2: 24 mEq/L (ref 19–32)
Chloride: 99 mEq/L (ref 96–112)
GFR calc Af Amer: >90 mL/min (ref >90)
GFR calc non Af Amer: >90 mL/min (ref >90)
Glucose, Bld: 105 mg/dL — ABNORMAL HIGH (ref 70–99)
Potassium: 3.1 mEq/L — ABNORMAL LOW (ref 3.5–5.1)
Sodium: 136 mEq/L (ref 135–145)

## 2012-07-28 LAB — CBC WITH DIFFERENTIAL/PLATELET
Basophils Absolute: 0 10*3/uL (ref 0.0–0.1)
Basophils Relative: 0 % (ref 0–1)
MCHC: 32.9 g/dL (ref 30.0–36.0)
Monocytes Absolute: 0.3 10*3/uL (ref 0.1–1.0)
Neutro Abs: 10.9 10*3/uL — ABNORMAL HIGH (ref 1.7–7.7)
Neutrophils Relative %: 85 % — ABNORMAL HIGH (ref 43–77)
RDW: 14.6 % (ref 11.5–15.5)

## 2012-07-28 MED ORDER — HYDROCODONE-ACETAMINOPHEN 5-325 MG PO TABS: 1.0000 | ORAL_TABLET | Freq: Three times a day (TID) | ORAL | Status: DC | PRN

## 2012-07-28 MED ORDER — MORPHINE SULFATE 2 MG/ML IJ SOLN
2.0000 mg | Freq: Once | INTRAMUSCULAR | Status: AC
  Administered 2012-07-28: 2 mg via INTRAMUSCULAR
  Filled 2012-07-28: qty 1

## 2012-07-28 MED ORDER — MORPHINE SULFATE 2 MG/ML IJ SOLN: 2.0000 mg | Freq: Once | INTRAMUSCULAR | Status: DC

## 2012-07-28 NOTE — Progress Notes (Signed)
FHR monitor strip reviewed.  Asher Muir, RN called from [facility].  Patient amy be removed from monitors and discharged soon.

## 2012-07-28 NOTE — Progress Notes (Signed)
Called concerning patient in [place] ED.  OBIX  Launched.  Monitors applied per ED staff. Please see ED notes for further information.

## 2012-07-28 NOTE — ED Notes (Signed)
Patient placed on fetal monitor and toco monitor at this time.

## 2012-07-28 NOTE — ED Provider Notes (Signed)
Patient is return for ultrasound which is positive for cholelithiasis without cholecystitis. She has already received a referral to Dr. Lovell Sheehan and she is advised to make a followup appointment. The meantime, she is to stay on a low-fat diet.  Results for orders placed during the hospital encounter of 07/27/12  URINALYSIS, ROUTINE W REFLEX MICROSCOPIC      Result Value Range   Color, Urine YELLOW  YELLOW   APPearance CLEAR  CLEAR   Specific Gravity, Urine >1.030 (*) 1.005 - 1.030   pH 6.0  5.0 - 8.0   Glucose, UA NEGATIVE  NEGATIVE mg/dL   Hgb urine dipstick NEGATIVE  NEGATIVE   Bilirubin Urine MODERATE (*) NEGATIVE   Ketones, ur >80 (*) NEGATIVE mg/dL   Protein, ur 30 (*) NEGATIVE mg/dL   Urobilinogen, UA 1.0  0.0 - 1.0 mg/dL   Nitrite NEGATIVE  NEGATIVE   Leukocytes, UA NEGATIVE  NEGATIVE  COMPREHENSIVE METABOLIC PANEL      Result Value Range   Sodium 136  135 - 145 mEq/L   Potassium 3.1 (*) 3.5 - 5.1 mEq/L   Chloride 99  96 - 112 mEq/L   CO2 24  19 - 32 mEq/L   Glucose, Bld 105 (*) 70 - 99 mg/dL   BUN 4 (*) 6 - 23 mg/dL   Creatinine, Ser 1.47  0.50 - 1.10 mg/dL   Calcium 9.0  8.4 - 82.9 mg/dL   Total Protein 7.3  6.0 - 8.3 g/dL   Albumin 2.7 (*) 3.5 - 5.2 g/dL   AST 22  0 - 37 U/L   ALT 13  0 - 35 U/L   Alkaline Phosphatase 121 (*) 39 - 117 U/L   Total Bilirubin 0.3  0.3 - 1.2 mg/dL   GFR calc non Af Amer >90  >90 mL/min   GFR calc Af Amer >90  >90 mL/min  CBC WITH DIFFERENTIAL      Result Value Range   WBC 12.8 (*) 4.0 - 10.5 K/uL   RBC 3.94  3.87 - 5.11 MIL/uL   Hemoglobin 11.1 (*) 12.0 - 15.0 g/dL   HCT 56.2 (*) 13.0 - 86.5 %   MCV 85.5  78.0 - 100.0 fL   MCH 28.2  26.0 - 34.0 pg   MCHC 32.9  30.0 - 36.0 g/dL   RDW 78.4  69.6 - 29.5 %   Platelets 307  150 - 400 K/uL   Neutrophils Relative % 85 (*) 43 - 77 %   Neutro Abs 10.9 (*) 1.7 - 7.7 K/uL   Lymphocytes Relative 12  12 - 46 %   Lymphs Abs 1.5  0.7 - 4.0 K/uL   Monocytes Relative 2 (*) 3 - 12 %   Monocytes  Absolute 0.3  0.1 - 1.0 K/uL   Eosinophils Relative 1  0 - 5 %   Eosinophils Absolute 0.1  0.0 - 0.7 K/uL   Basophils Relative 0  0 - 1 %   Basophils Absolute 0.0  0.0 - 0.1 K/uL  LIPASE, BLOOD      Result Value Range   Lipase 16  11 - 59 U/L  URINE MICROSCOPIC-ADD ON      Result Value Range   Squamous Epithelial / LPF MANY (*) RARE   WBC, UA 3-6  <3 WBC/hpf   RBC / HPF 0-2  <3 RBC/hpf   Bacteria, UA FEW (*) RARE   US Abdomen Limited Ruq  07/28/2012   *RADIOLOGY REPORT*  Clinical Data:  Right upper  quadrant abdominal pain, nausea/vomiting, 5 months pregnant.  LIMITED ABDOMINAL ULTRASOUND - RIGHT UPPER QUADRANT  Comparison:  11/09/2011  Findings:  Gallbladder:  Contracted gallbladder with numerous gallstones.  No gallbladder wall thickening or pericholecystic fluid.  Negative sonographic Murphy's sign.  Common bile duct:  Measures 5.5 mm.  Liver:  Hyperechoic hepatic parenchyma, suggesting hepatic steatosis.  IMPRESSION: Cholelithiasis, without associated findings to suggest acute cholecystitis.  Hepatic steatosis.                    Original Report Authenticated By: Charline Bills, M.D.    Marland Kitchen  Dione Booze, MD 07/28/12 [phone]

## 2012-07-28 NOTE — ED Provider Notes (Signed)
History     CSN: [id number]  Arrival date & time 07/27/12  2156   First MD Initiated Contact with Patient 07/28/12 0011      Chief Complaint  Patient presents with  . Abdominal Pain  . Back Pain     Patient is a 27 y.o. female presenting with abdominal pain. The history is provided by the patient.  Abdominal Pain This is a recurrent problem. Episode onset: unknown time ago. The problem occurs constantly. The problem has been gradually worsening. Associated symptoms include abdominal pain. Pertinent negatives include no chest pain and no shortness of breath. Nothing aggravates the symptoms. Nothing relieves the symptoms. She has tried rest for the symptoms. The treatment provided no relief.  pt is a G2P1 at 25 weeks who presents for epigastric abdominal pain and nausea/vomiting.  She reports she has abdominal pain "everyday" but it seemed worse today with 2 episodes of vomiting  No diarrhea No vag bleeding/discharge No contractions.  No lower abdominal pain She has had no complications with this pregnancy No falls reported No cp/sob She reports epigastric abdominal pain will radiate to her back at times No focal weakness is reported   Past Medical History  Diagnosis Date  . Gallstones and inflammation of gallbladder without obstruction 03/28/2012  . Depression   . Heart palpitations     History reviewed. No pertinent past surgical history.  Family History  Problem Relation Age of Onset  . Coronary artery disease Mother   . Hypertension Mother   . Breast cancer Cousin   . Diabetes Maternal Aunt   . Stroke Maternal Aunt     History  Substance Use Topics  . Smoking status: Current Every Day Smoker -- 0.50 packs/day    Types: Cigarettes  . Smokeless tobacco: Not on file  . Alcohol Use: Yes     Comment: occ. use. no alcohol since preg.    OB History   Grav Para Term Preterm Abortions TAB SAB Ect Mult Living   2 1 1       1       Review of Systems   Constitutional: Negative for fever.  Respiratory: Negative for shortness of breath.   Cardiovascular: Negative for chest pain.  Gastrointestinal: Positive for nausea, vomiting and abdominal pain. Negative for diarrhea.  Genitourinary: Positive for urgency and difficulty urinating. Negative for vaginal bleeding and vaginal discharge.  All other systems reviewed and are negative.    Allergies  Review of patient's allergies indicates no known allergies.  Home Medications   Current Outpatient Rx  Name  Route  Sig  Dispense  Refill  . prenatal vitamin w/FE, FA (PRENATAL 1 + 1) 27-1 MG TABS   Oral   Take 1 tablet by mouth daily.           BP 141/65  Pulse 81  Temp(Src) 97.9 F (36.6 C) (Oral)  Resp 20  Ht 5\' 6"  (1.676 m)  Wt 260 lb (117.935 kg)  BMI 41.99 kg/m2  SpO2 100%  LMP 11/03/2011  Physical Exam CONSTITUTIONAL: Well developed/well nourished HEAD: Normocephalic/atraumatic EYES: EOMI/PERRL, no icterus ENMT: Mucous membranes moist NECK: supple no meningeal signs SPINE:entire spine nontender CV: S1/S2 noted, no murmurs/rubs/gallops noted LUNGS: Lungs are clear to auscultation bilaterally, no apparent distress ABDOMEN: soft, epigastric abdominal pain, pain is moderate, no rebound or guarding No lower abdominal tenderness noted GU:no cva tenderness NEURO: Pt is awake/alert, moves all extremitiesx4 EXTREMITIES: pulses normal, full ROM SKIN: warm, color normal PSYCH: no abnormalities of mood  noted  ED Course  Procedures   Labs Reviewed  URINALYSIS, ROUTINE W REFLEX MICROSCOPIC  COMPREHENSIVE METABOLIC PANEL  CBC WITH DIFFERENTIAL  LIPASE, BLOOD  12:32 AM Pt reports h/o gallbladder disease with worsening pain and vomiting She denies low abd pain and denies contractions.  I doubt she is in labor but will place on fetal monitoring Labs have been ordered We discussed risk/benefit of receiving pain meds while pregnant, she would like to proceed with morphine for  pain   Pt is improved, well appearing and in no distress No issues per [facility] hospital with fetal monitoring.  Low suspicion for acute OB emergency I suspect she has worsening biliary colic I have arranged for outpatient ultrasound tomorrow I have also spoken to local surgeon (jenkins) and OB on call (arnold) They feel she needs repeat US and close f/u with OB next week after [place] is complete Clinically she does not appear to have acute cholecystitis  I have discussed strict return precautions and short course of pain meds given to patient   MDM  Nursing notes including past medical history and social history reviewed and considered in documentation Labs/vital reviewed and considered         Joya Gaskins, MD 07/28/12 [phone]

## 2012-07-30 LAB — URINE CULTURE: Colony Count: 9000

## 2012-08-15 ENCOUNTER — Encounter: Payer: Self-pay | Admitting: Advanced Practice Midwife

## 2012-08-15 ENCOUNTER — Other Ambulatory Visit: Payer: Medicaid Other

## 2012-08-15 ENCOUNTER — Ambulatory Visit (INDEPENDENT_AMBULATORY_CARE_PROVIDER_SITE_OTHER): Payer: Medicaid Other | Admitting: Advanced Practice Midwife

## 2012-08-15 VITALS — BP 110/80 | Wt 258.0 lb

## 2012-08-15 DIAGNOSIS — E669 Obesity, unspecified: Secondary | ICD-10-CM

## 2012-08-15 DIAGNOSIS — Z331 Pregnant state, incidental: Secondary | ICD-10-CM

## 2012-08-15 DIAGNOSIS — Z1389 Encounter for screening for other disorder: Secondary | ICD-10-CM

## 2012-08-15 DIAGNOSIS — Z3480 Encounter for supervision of other normal pregnancy, unspecified trimester: Secondary | ICD-10-CM

## 2012-08-15 DIAGNOSIS — O99340 Other mental disorders complicating pregnancy, unspecified trimester: Secondary | ICD-10-CM

## 2012-08-15 LAB — POCT URINALYSIS DIPSTICK
Glucose, UA: NEGATIVE
Ketones, UA: NEGATIVE
Leukocytes, UA: NEGATIVE

## 2012-08-15 LAB — CBC
Hemoglobin: 10.1 g/dL — ABNORMAL LOW (ref 12.0–15.0)
MCHC: 33.1 g/dL (ref 30.0–36.0)
Platelets: 364 10*3/uL (ref 150–400)

## 2012-08-15 NOTE — Progress Notes (Signed)
Doing PN2 today.  Eating "OK--3-4 meals a day, some snacks.  No taste for sweets" but stays away from fatty foods because it trigger GB symptoms. No c/o at this time.  Routine questions about pregnancy answered.  F/U in 4 weeks for LROB.

## 2012-08-16 LAB — RPR

## 2012-08-16 LAB — HIV ANTIBODY (ROUTINE TESTING W REFLEX): HIV: NONREACTIVE

## 2012-08-16 LAB — GLUCOSE TOLERANCE, 2 HOURS W/ 1HR: Glucose, 2 hour: 114 mg/dL (ref 70–139)

## 2012-09-12 ENCOUNTER — Encounter: Payer: Self-pay | Admitting: Obstetrics & Gynecology

## 2012-09-12 ENCOUNTER — Ambulatory Visit (INDEPENDENT_AMBULATORY_CARE_PROVIDER_SITE_OTHER): Payer: Medicaid Other | Admitting: Obstetrics & Gynecology

## 2012-09-12 VITALS — BP 120/80 | Wt 252.0 lb

## 2012-09-12 DIAGNOSIS — Z1389 Encounter for screening for other disorder: Secondary | ICD-10-CM

## 2012-09-12 DIAGNOSIS — O99210 Obesity complicating pregnancy, unspecified trimester: Secondary | ICD-10-CM

## 2012-09-12 DIAGNOSIS — O99340 Other mental disorders complicating pregnancy, unspecified trimester: Secondary | ICD-10-CM

## 2012-09-12 DIAGNOSIS — Z331 Pregnant state, incidental: Secondary | ICD-10-CM

## 2012-09-12 LAB — POCT URINALYSIS DIPSTICK
Glucose, UA: NEGATIVE
Nitrite, UA: NEGATIVE

## 2012-09-12 NOTE — Patient Instructions (Signed)
Epidural Risks and Benefits The continuous putting in (infusion) of local anesthetics through a long, narrow, hollow plastic tube (catheter)/needle into the lower (lumbar) area of your spine is commonly called an epidural. This means outside the covering of the spinal cord. The epidural catheter is placed in the space on the outside of the membrane that covers the spinal cord. The anesthetic medicine numbs the nerves of the spinal cord in the epidural space. There is also a spinal/epidural anesthetic using two needles and a catheter. The medication is first placed in the spinal canal. Then that needle is removed and a catheter is placed in the epidural space through the second needle for continuous anesthesia. This seems to be the most popular type of regional anesthesia used now. This is sometimes given for pain management to women who are giving birth. Spinal and epidural anesthesia are called regional anesthesia because they numb a certain region of the body. While it is an effective pain management tool, some reasons not to use this include:  Restricted mobility: The tubes and monitors connected to you do not allow for much moving around.  Increased likelihood of bladder catheterization, oxytocin administration, and internal monitoring. This means a tube (catheter) may have to be put into the bladder to drain the urine. Uterine contractions can become weaker and less frequent. They also may have a higher use of oxytocin than mothers not having regional anesthesia.  Increased likelihood of operative delivery: This includes the use of or need for forceps, vacuum extractor, episiotomy, or cesarean delivery. When the dose is too large, or when it sinks down into the "tailbone" (sacral) region of the body, the perineum and the birth canal (vagina) are anesthetized. Anesthetic is injected into this area late in labor to deaden all sensation. When it "accidentally" happens earlier in labor, the muscles of the  pelvic floor are relaxed too early. This interferes with the normal flexion and rotation of the baby's head as it passes through the birth canal. This interference can lead to abnormal presentations that are more dangerous for the baby.  Must use an automatic blood pressure cuff throughout labor. This is a cuff that automatically takes your blood pressure at regular intervals. SHORT TERM MATERNAL RISKS  Dural puncture - The dura is one of the membranes surrounding the spinal cord. If the anesthetic medication gets into the spinal canal through a dural puncture, it can result in a spinal anesthetic and spinal headache. Spinal headaches are treated with an epidural blood patch to cover the punctured area.  Low blood pressure (hypotension) - Nearly one third of women with an epidural will develop low blood pressure. The ways that patients must lay during the epidural can make this worse. Their position is limited because they will be unable to move their legs easily for the time of the anesthetic. Low blood pressure is also a risk for the baby. If the baby does not get enough oxygen from the mom's blood, it can result in an emergency Cesarean section. This means the baby is delivered by an operation through a cut by the surgeon (incision) on the belly of the mother.  Nausea, vomiting, and prolonged shivering.  Prolonged labor - With large doses of anesthetic medication, the patient loses the desire and the ability to bear down and push. This results in an increased use of forceps and vacuum extractions, compared to women having unmedicated deliveries.  Uneven, incomplete or non-existent pain relief. Sometimes the epidural does not work well and   additional medications may be needed for pain relief.  Difficulty breathing well or paralysis if the level of anesthesia goes too high in the spine.  Convulsions - If the anesthetic agent accidentally is injected into a blood vessel it can cause convulsions and  loss of consciousness.  Toxic drug reactions.  Septic meningitis - An abscess can form at the site where the epidural catheter is placed. If this spreads into the spinal canal it can cause meningitis.  Allergic reaction - This causes blood pressure to become too low and other medications and fluids must be given to bring the blood pressure up. Also rashes and difficulty breathing may develop.  Cardiac arrest - This is rare but real threat to the life of the mother and baby.  Fever is common.  Itching that is easily treated.  Spinal hematoma. LONG TERM MATERNAL RISKS  Neurological complications - A nerve problem called Horner's syndrome can develop with epidural anesthesia for vaginal delivery. It is impossible to predict which patients will develop a Horner's syndrome. Even the nerves to the face can be blocked, temporarily or permanently. Tremors and shakes can occur.  Paresthesia ("pins and needles"). This is a feeling that comes from inflammation of a nerve.  Dizziness and fainting can become a problem after epidurals. This is usually only for a couple of days. RISKS TO BABY  Direct drug toxicity.  Fetal distress, abnormal fetal heart rate (FHR) (can lead to emergency cesarean). This is especially true if the anesthetic gets into the mother's blood stream or too much medication is put into the epidural. REASONS NOT TO HAVE EPIDURAL ANESTHESIA  Increased costs.  The mother has a low blood pressure.  There are blood clotting problems.  A brain tumor is present.  There is an infection in the blood stream.  A skin infection at the needle site.  A tattoo at the needle site. BENEFITS  Regional anesthesia is the most effective pain relief for labor and delivery.  It is the best anesthetic for preeclampsia and eclampsia.  There is better pain control after delivery (vaginal or cesarean).  When done correctly, no medication gets to the baby.  Sooner ambulation after  delivery.  It can be left in place during all of labor.  You can be awake during a Cesarean delivery and see the baby immediately after delivery. AFTER THE PROCEDURE   You will be kept in bed for several hours to prevent headaches.  You will be kept in bed until your legs are no longer numb and it is safe to walk.  The length of time you spend in the hospital will depend on the type of surgery or procedure you have had.  The epidural catheter is removed after you no longer need it for pain. HOME CARE INSTRUCTIONS   Do not drive or operate any kind of machinery for at least 24 hours. Make sure there is someone to drive you home.  Do not drink alcohol for at least 24 hours after the anesthesia.  Do not make important decisions for at least 24 hours after the anesthesia.  Drink lots of fluids.  Return to your normal diet.  Keep all your postoperative appointments as scheduled. SEEK IMMEDIATE MEDICAL CARE IF:  You develop a fever or temperature over 98.6 F (37 C).  You have a persistent headache.  You develop dizziness, fainting or lightheadedness.  You develop weakness, numbness or tingling in your arms or legs.  You have a skin rash.  You   have difficulty breathing  You have a stiff neck with or without stiff back.  You develop chest pain. Document Released: 02/20/2005 Document Revised: 05/15/2011 Document Reviewed: 03/30/2008 [employer] Patient Information 2014 [employer].  

## 2012-09-12 NOTE — Progress Notes (Signed)
BP weight and urine results all reviewed and noted. Patient reports good fetal movement, denies any bleeding and no rupture of membranes symptoms or regular contractions. Patient is without complaints. All questions were answered.  

## 2012-09-26 ENCOUNTER — Ambulatory Visit (INDEPENDENT_AMBULATORY_CARE_PROVIDER_SITE_OTHER): Payer: Medicaid Other | Admitting: Obstetrics and Gynecology

## 2012-09-26 VITALS — BP 120/80 | Wt 252.0 lb

## 2012-09-26 DIAGNOSIS — K8000 Calculus of gallbladder with acute cholecystitis without obstruction: Secondary | ICD-10-CM

## 2012-09-26 DIAGNOSIS — E669 Obesity, unspecified: Secondary | ICD-10-CM

## 2012-09-26 DIAGNOSIS — Z331 Pregnant state, incidental: Secondary | ICD-10-CM

## 2012-09-26 DIAGNOSIS — O99340 Other mental disorders complicating pregnancy, unspecified trimester: Secondary | ICD-10-CM

## 2012-09-26 DIAGNOSIS — Z1389 Encounter for screening for other disorder: Secondary | ICD-10-CM

## 2012-09-26 DIAGNOSIS — Z3483 Encounter for supervision of other normal pregnancy, third trimester: Secondary | ICD-10-CM

## 2012-09-26 LAB — POCT URINALYSIS DIPSTICK
Glucose, UA: NEGATIVE
Leukocytes, UA: NEGATIVE
Nitrite, UA: NEGATIVE

## 2012-09-26 NOTE — Progress Notes (Signed)
No uti sx. No PIH sx. Denies vag dischg. Collected first portion of urine. Will send u/a.  FH= U+15 NO pt concerns.

## 2012-09-26 NOTE — Progress Notes (Signed)
No complaints at this time.

## 2012-09-26 NOTE — Patient Instructions (Addendum)
Fetal Movement Counts Patient Name: __________________________________________________ Patient Due Date: ____________________ Performing a fetal movement count is highly recommended in high-risk pregnancies, but it is good for every pregnant woman to do. Your caregiver may ask you to start counting fetal movements at 28 weeks of the pregnancy. Fetal movements often increase:  After eating a full meal.  After physical activity.  After eating or drinking something sweet or cold.  At rest. Pay attention to when you feel the baby is most active. This will help you notice a pattern of your baby's sleep and wake cycles and what factors contribute to an increase in fetal movement. It is important to perform a fetal movement count at the same time each day when your baby is normally most active.  HOW TO COUNT FETAL MOVEMENTS 1. Find a quiet and comfortable area to sit or lie down on your left side. Lying on your left side provides the best blood and oxygen circulation to your baby. 2. Write down the day and time on a sheet of paper or in a journal. 3. Start counting kicks, flutters, swishes, rolls, or jabs in a 2 hour period. You should feel at least 10 movements within 2 hours. 4. If you do not feel 10 movements in 2 hours, wait 2 3 hours and count again. Look for a change in the pattern or not enough counts in 2 hours. SEEK MEDICAL CARE IF:  You feel less than 10 counts in 2 hours, tried twice.  There is no movement in over an hour.  The pattern is changing or taking longer each day to reach 10 counts in 2 hours.  You feel the baby is not moving as he or she usually does. Date: ____________ Movements: ____________ Start time: ____________ Finish time: ____________  Date: ____________ Movements: ____________ Start time: ____________ Finish time: ____________ Date: ____________ Movements: ____________ Start time: ____________ Finish time: ____________ Date: ____________ Movements: ____________  Start time: ____________ Finish time: ____________ Date: ____________ Movements: ____________ Start time: ____________ Finish time: ____________ Date: ____________ Movements: ____________ Start time: ____________ Finish time: ____________ Date: ____________ Movements: ____________ Start time: ____________ Finish time: ____________ Date: ____________ Movements: ____________ Start time: ____________ Finish time: ____________  Date: ____________ Movements: ____________ Start time: ____________ Finish time: ____________ Date: ____________ Movements: ____________ Start time: ____________ Finish time: ____________ Date: ____________ Movements: ____________ Start time: ____________ Finish time: ____________ Date: ____________ Movements: ____________ Start time: ____________ Finish time: ____________ Date: ____________ Movements: ____________ Start time: ____________ Finish time: ____________ Date: ____________ Movements: ____________ Start time: ____________ Finish time: ____________ Date: ____________ Movements: ____________ Start time: ____________ Finish time: ____________  Date: ____________ Movements: ____________ Start time: ____________ Finish time: ____________ Date: ____________ Movements: ____________ Start time: ____________ Finish time: ____________ Date: ____________ Movements: ____________ Start time: ____________ Finish time: ____________ Date: ____________ Movements: ____________ Start time: ____________ Finish time: ____________ Date: ____________ Movements: ____________ Start time: ____________ Finish time: ____________ Date: ____________ Movements: ____________ Start time: ____________ Finish time: ____________ Date: ____________ Movements: ____________ Start time: ____________ Finish time: ____________  Date: ____________ Movements: ____________ Start time: ____________ Finish time: ____________ Date: ____________ Movements: ____________ Start time: ____________ Finish time:  ____________ Date: ____________ Movements: ____________ Start time: ____________ Finish time: ____________ Date: ____________ Movements: ____________ Start time: ____________ Finish time: ____________ Date: ____________ Movements: ____________ Start time: ____________ Finish time: ____________ Date: ____________ Movements: ____________ Start time: ____________ Finish time: ____________ Date: ____________ Movements: ____________ Start time: ____________ Finish time: ____________  Date: ____________ Movements: ____________ Start time: ____________ Finish   time: ____________ Date: ____________ Movements: ____________ Start time: ____________ Finish time: ____________ Date: ____________ Movements: ____________ Start time: ____________ Finish time: ____________ Date: ____________ Movements: ____________ Start time: ____________ Finish time: ____________ Date: ____________ Movements: ____________ Start time: ____________ Finish time: ____________ Date: ____________ Movements: ____________ Start time: ____________ Finish time: ____________ Date: ____________ Movements: ____________ Start time: ____________ Finish time: ____________  Date: ____________ Movements: ____________ Start time: ____________ Finish time: ____________ Date: ____________ Movements: ____________ Start time: ____________ Finish time: ____________ Date: ____________ Movements: ____________ Start time: ____________ Finish time: ____________ Date: ____________ Movements: ____________ Start time: ____________ Finish time: ____________ Date: ____________ Movements: ____________ Start time: ____________ Finish time: ____________ Date: ____________ Movements: ____________ Start time: ____________ Finish time: ____________ Date: ____________ Movements: ____________ Start time: ____________ Finish time: ____________  Date: ____________ Movements: ____________ Start time: ____________ Finish time: ____________ Date: ____________ Movements:  ____________ Start time: ____________ Finish time: ____________ Date: ____________ Movements: ____________ Start time: ____________ Finish time: ____________ Date: ____________ Movements: ____________ Start time: ____________ Finish time: ____________ Date: ____________ Movements: ____________ Start time: ____________ Finish time: ____________ Date: ____________ Movements: ____________ Start time: ____________ Finish time: ____________ Date: ____________ Movements: ____________ Start time: ____________ Finish time: ____________  Date: ____________ Movements: ____________ Start time: ____________ Finish time: ____________ Date: ____________ Movements: ____________ Start time: ____________ Finish time: ____________ Date: ____________ Movements: ____________ Start time: ____________ Finish time: ____________ Date: ____________ Movements: ____________ Start time: ____________ Finish time: ____________ Date: ____________ Movements: ____________ Start time: ____________ Finish time: ____________ Date: ____________ Movements: ____________ Start time: ____________ Finish time: ____________ Document Released: 03/22/2006 Document Revised: 02/07/2012 Document Reviewed: 12/18/2011 [employer] Patient Information 2014 [employer].  

## 2012-09-26 NOTE — Addendum Note (Signed)
Addended by: Tilda Burrow on: 09/26/2012 10:02 AM   Modules accepted: Level of Service

## 2012-10-11 ENCOUNTER — Ambulatory Visit (INDEPENDENT_AMBULATORY_CARE_PROVIDER_SITE_OTHER): Payer: Medicaid Other | Admitting: Obstetrics & Gynecology

## 2012-10-11 VITALS — BP 120/90 | Wt 248.5 lb

## 2012-10-11 DIAGNOSIS — Z1389 Encounter for screening for other disorder: Secondary | ICD-10-CM

## 2012-10-11 DIAGNOSIS — Z331 Pregnant state, incidental: Secondary | ICD-10-CM

## 2012-10-11 DIAGNOSIS — O99340 Other mental disorders complicating pregnancy, unspecified trimester: Secondary | ICD-10-CM

## 2012-10-11 DIAGNOSIS — E669 Obesity, unspecified: Secondary | ICD-10-CM

## 2012-10-11 DIAGNOSIS — O99019 Anemia complicating pregnancy, unspecified trimester: Secondary | ICD-10-CM

## 2012-10-11 LAB — POCT URINALYSIS DIPSTICK
Blood, UA: NEGATIVE
Glucose, UA: NEGATIVE
Nitrite, UA: NEGATIVE
Protein, UA: 1

## 2012-10-11 NOTE — Progress Notes (Signed)
BP weight and urine results all reviewed and noted. Patient reports good fetal movement, denies any bleeding and no rupture of membranes symptoms or regular contractions. Patient is without complaints. All questions were answered.  

## 2012-10-11 NOTE — Progress Notes (Signed)
No complaints at this time.

## 2012-10-18 ENCOUNTER — Encounter: Payer: Self-pay | Admitting: Obstetrics & Gynecology

## 2012-10-18 ENCOUNTER — Ambulatory Visit (INDEPENDENT_AMBULATORY_CARE_PROVIDER_SITE_OTHER): Payer: Medicaid Other | Admitting: Obstetrics & Gynecology

## 2012-10-18 VITALS — BP 118/80 | Wt 249.0 lb

## 2012-10-18 DIAGNOSIS — Z331 Pregnant state, incidental: Secondary | ICD-10-CM

## 2012-10-18 DIAGNOSIS — Z3483 Encounter for supervision of other normal pregnancy, third trimester: Secondary | ICD-10-CM

## 2012-10-18 DIAGNOSIS — O1213 Gestational proteinuria, third trimester: Secondary | ICD-10-CM

## 2012-10-18 DIAGNOSIS — O99340 Other mental disorders complicating pregnancy, unspecified trimester: Secondary | ICD-10-CM

## 2012-10-18 DIAGNOSIS — Z1389 Encounter for screening for other disorder: Secondary | ICD-10-CM

## 2012-10-18 LAB — POCT URINALYSIS DIPSTICK: Protein, UA: 2

## 2012-10-18 NOTE — Progress Notes (Signed)
BP weight and urine results all reviewed and noted. Patient reports good fetal movement, denies any bleeding and no rupture of membranes symptoms or regular contractions. Patient is without complaints. All questions were answered. Do Pr/Cr ratio, no evidence of pre eclampsia

## 2012-10-18 NOTE — Progress Notes (Signed)
FOR GBS/GC/CHL TODAY. 

## 2012-10-18 NOTE — Addendum Note (Signed)
Addended by: Colen Darling on: 10/18/2012 12:46 PM   Modules accepted: Orders

## 2012-10-19 LAB — GC/CHLAMYDIA PROBE AMP
CT Probe RNA: NEGATIVE
GC Probe RNA: NEGATIVE

## 2012-10-19 LAB — PROTEIN / CREATININE RATIO, URINE
Creatinine, Urine: 620.4 mg/dL
Total Protein, Urine: 65 mg/dL

## 2012-10-20 LAB — STREP B DNA PROBE: GBSP: NEGATIVE

## 2012-10-29 ENCOUNTER — Ambulatory Visit (INDEPENDENT_AMBULATORY_CARE_PROVIDER_SITE_OTHER): Payer: Medicaid Other | Admitting: Women's Health

## 2012-10-29 ENCOUNTER — Encounter: Payer: Self-pay | Admitting: Women's Health

## 2012-10-29 VITALS — BP 140/82 | Wt 251.2 lb

## 2012-10-29 DIAGNOSIS — O99340 Other mental disorders complicating pregnancy, unspecified trimester: Secondary | ICD-10-CM

## 2012-10-29 DIAGNOSIS — Z1389 Encounter for screening for other disorder: Secondary | ICD-10-CM

## 2012-10-29 DIAGNOSIS — Z3483 Encounter for supervision of other normal pregnancy, third trimester: Secondary | ICD-10-CM

## 2012-10-29 DIAGNOSIS — O99013 Anemia complicating pregnancy, third trimester: Secondary | ICD-10-CM

## 2012-10-29 DIAGNOSIS — Z331 Pregnant state, incidental: Secondary | ICD-10-CM

## 2012-10-29 DIAGNOSIS — E669 Obesity, unspecified: Secondary | ICD-10-CM

## 2012-10-29 DIAGNOSIS — O99019 Anemia complicating pregnancy, unspecified trimester: Secondary | ICD-10-CM

## 2012-10-29 LAB — POCT URINALYSIS DIPSTICK
Glucose, UA: NEGATIVE
Nitrite, UA: NEGATIVE

## 2012-10-29 MED ORDER — FERROUS SULFATE 325 (65 FE) MG PO TABS: 325.0000 mg | ORAL_TABLET | Freq: Two times a day (BID) | ORAL | Status: DC

## 2012-10-29 NOTE — Patient Instructions (Signed)
Call the office or go to [facility] if:  You begin to have strong, frequent contractions  Your water breaks.  Sometimes it is a big gush of fluid, sometimes it is just a trickle that keeps getting your panties wet or running down your legs  You have vaginal bleeding.  It is normal to have a small amount of spotting if your cervix was checked.   You don't feel your baby moving like normal.  If you don't, get you something to eat and drink and lay down and focus on feeling your baby move.  You should feel at least 10 movements in 2 hours.  If you don't, you should call the office or go to [facility].   Braxton Hicks Contractions Pregnancy is commonly associated with contractions of the uterus throughout the pregnancy. Towards the end of pregnancy (32 to 34 weeks), these contractions (Braxton Hicks) can develop more often and may become more forceful. This is not true labor because these contractions do not result in opening (dilatation) and thinning of the cervix. They are sometimes difficult to tell apart from true labor because these contractions can be forceful and people have different pain tolerances. You should not feel embarrassed if you go to the hospital with false labor. Sometimes, the only way to tell if you are in true labor is for your caregiver to follow the changes in the cervix. How to tell the difference between true and false labor:  False labor.  The contractions of false labor are usually shorter, irregular and not as hard as those of true labor.  They are often felt in the front of the lower abdomen and in the groin.  They may leave with walking around or changing positions while lying down.  They get weaker and are shorter lasting as time goes on.  These contractions are usually irregular.  They do not usually become progressively stronger, regular and closer together as with true labor.  True labor.  Contractions in true labor last 30 to 70 seconds,  become very regular, usually become more intense, and increase in frequency.  They do not go away with walking.  The discomfort is usually felt in the top of the uterus and spreads to the lower abdomen and low back.  True labor can be determined by your caregiver with an exam. This will show that the cervix is dilating and getting thinner. If there are no prenatal problems or other health problems associated with the pregnancy, it is completely safe to be sent home with false labor and await the onset of true labor. HOME CARE INSTRUCTIONS   Keep up with your usual exercises and instructions.  Take medications as directed.  Keep your regular prenatal appointment.  Eat and drink lightly if you think you are going into labor.  If BH contractions are making you uncomfortable:  Change your activity position from lying down or resting to walking/walking to resting.  Sit and rest in a tub of warm water.  Drink 2 to 3 glasses of water. Dehydration may cause B-H contractions.  Do slow and deep breathing several times an hour. SEEK IMMEDIATE MEDICAL CARE IF:   Your contractions continue to become stronger, more regular, and closer together.  You have a gushing, burst or leaking of fluid from the vagina.  An oral temperature above 102 F (38.9 C) develops.  You have passage of blood-tinged mucus.  You develop vaginal bleeding.  You develop continuous belly (abdominal) pain.  You have   low back pain that you never had before.  You feel the baby's head pushing down causing pelvic pressure.  The baby is not moving as much as it used to. Document Released: 02/20/2005 Document Revised: 05/15/2011 Document Reviewed: 08/14/2008 [employer] Patient Information 2014 [employer].  

## 2012-10-29 NOTE — Progress Notes (Signed)
Reports good fm. Denies uc's, lof, vb, urinary frequency, urgency, hesitancy, or dysuria. Pressure, irregular uc's.  Denies ha, scotomata, ruq/epigastric pain, n/v.  No h/o pre-e last preg, this is a new FOB. Reviewed labor s/s, pre-e s/s, fkc.  All questions answered. F/U in 3d for bp check/visit

## 2012-10-31 ENCOUNTER — Telehealth: Payer: Self-pay | Admitting: Obstetrics and Gynecology

## 2012-10-31 NOTE — Telephone Encounter (Signed)
Pt states blood pressure yesterday 140/92, today 146/84. Pt states no blurred vision, dizziness or headaches, only cramping this am. Pt has an appt tomorrow. Per Cyril Mourning, NP pt to keep her appt tomorrow. Pt verbalized understanding.

## 2012-11-01 ENCOUNTER — Encounter: Payer: Self-pay | Admitting: Obstetrics & Gynecology

## 2012-11-01 ENCOUNTER — Ambulatory Visit (INDEPENDENT_AMBULATORY_CARE_PROVIDER_SITE_OTHER): Payer: Medicaid Other | Admitting: Obstetrics & Gynecology

## 2012-11-01 VITALS — BP 128/80 | Wt 252.0 lb

## 2012-11-01 DIAGNOSIS — E669 Obesity, unspecified: Secondary | ICD-10-CM

## 2012-11-01 DIAGNOSIS — O99019 Anemia complicating pregnancy, unspecified trimester: Secondary | ICD-10-CM

## 2012-11-01 DIAGNOSIS — Z1389 Encounter for screening for other disorder: Secondary | ICD-10-CM

## 2012-11-01 DIAGNOSIS — Z331 Pregnant state, incidental: Secondary | ICD-10-CM

## 2012-11-01 DIAGNOSIS — O99340 Other mental disorders complicating pregnancy, unspecified trimester: Secondary | ICD-10-CM

## 2012-11-01 LAB — POCT URINALYSIS DIPSTICK
Leukocytes, UA: NEGATIVE
Nitrite, UA: NEGATIVE

## 2012-11-01 NOTE — Progress Notes (Signed)
BP good today BP weight and urine results all reviewed and noted. Patient reports good fetal movement, denies any bleeding and no rupture of membranes symptoms or regular contractions. Patient is without complaints. All questions were answered.

## 2012-11-08 ENCOUNTER — Encounter: Payer: Self-pay | Admitting: Women's Health

## 2012-11-08 ENCOUNTER — Inpatient Hospital Stay (HOSPITAL_COMMUNITY)
Admission: EM | Admit: 2012-11-08 | Discharge: 2012-11-10 | DRG: 775 | Disposition: A | Discharge status: Home / Self Care | Payer: Medicaid Other | Attending: Obstetrics & Gynecology | Admitting: Obstetrics & Gynecology

## 2012-11-08 ENCOUNTER — Encounter (HOSPITAL_COMMUNITY): Payer: Self-pay | Admitting: *Deleted

## 2012-11-08 ENCOUNTER — Ambulatory Visit (INDEPENDENT_AMBULATORY_CARE_PROVIDER_SITE_OTHER): Payer: Medicaid Other | Admitting: Women's Health

## 2012-11-08 ENCOUNTER — Inpatient Hospital Stay (HOSPITAL_COMMUNITY): Payer: Medicaid Other | Admitting: Anesthesiology

## 2012-11-08 ENCOUNTER — Telehealth (HOSPITAL_COMMUNITY): Payer: Self-pay | Admitting: *Deleted

## 2012-11-08 ENCOUNTER — Encounter (HOSPITAL_COMMUNITY): Payer: Self-pay | Admitting: Anesthesiology

## 2012-11-08 VITALS — BP 120/80 | Wt 248.5 lb

## 2012-11-08 DIAGNOSIS — Z331 Pregnant state, incidental: Secondary | ICD-10-CM

## 2012-11-08 DIAGNOSIS — O99019 Anemia complicating pregnancy, unspecified trimester: Secondary | ICD-10-CM

## 2012-11-08 DIAGNOSIS — Z3483 Encounter for supervision of other normal pregnancy, third trimester: Secondary | ICD-10-CM

## 2012-11-08 DIAGNOSIS — O99340 Other mental disorders complicating pregnancy, unspecified trimester: Secondary | ICD-10-CM

## 2012-11-08 DIAGNOSIS — Z1389 Encounter for screening for other disorder: Secondary | ICD-10-CM

## 2012-11-08 DIAGNOSIS — Z3490 Encounter for supervision of normal pregnancy, unspecified, unspecified trimester: Secondary | ICD-10-CM

## 2012-11-08 DIAGNOSIS — E669 Obesity, unspecified: Secondary | ICD-10-CM

## 2012-11-08 LAB — POCT URINALYSIS DIPSTICK
Blood, UA: NEGATIVE
Nitrite, UA: NEGATIVE

## 2012-11-08 LAB — CBC
Hemoglobin: 10.9 g/dL — ABNORMAL LOW (ref 12.0–15.0)
MCHC: 32.8 g/dL (ref 30.0–36.0)
RDW: 13.9 % (ref 11.5–15.5)
WBC: 13.6 10*3/uL — ABNORMAL HIGH (ref 4.0–10.5)

## 2012-11-08 LAB — TYPE AND SCREEN
ABO/RH(D): O POS
Antibody Screen: NEGATIVE

## 2012-11-08 LAB — ABO/RH: ABO/RH(D): O POS

## 2012-11-08 MED ORDER — EPHEDRINE 5 MG/ML INJ
10.0000 mg | INTRAVENOUS | Status: DC | PRN
  Filled 2012-11-08: qty 2

## 2012-11-08 MED ORDER — PHENYLEPHRINE 40 MCG/ML (10ML) SYRINGE FOR IV PUSH (FOR BLOOD PRESSURE SUPPORT)
80.0000 ug | PREFILLED_SYRINGE | INTRAVENOUS | Status: DC | PRN
  Filled 2012-11-08: qty 5
  Filled 2012-11-08: qty 2

## 2012-11-08 MED ORDER — DIPHENHYDRAMINE HCL 50 MG/ML IJ SOLN: 12.5000 mg | INTRAMUSCULAR | Status: DC | PRN

## 2012-11-08 MED ORDER — PHENYLEPHRINE 40 MCG/ML (10ML) SYRINGE FOR IV PUSH (FOR BLOOD PRESSURE SUPPORT)
80.0000 ug | PREFILLED_SYRINGE | INTRAVENOUS | Status: DC | PRN
  Filled 2012-11-08: qty 2

## 2012-11-08 MED ORDER — OXYTOCIN 40 UNITS IN LACTATED RINGERS INFUSION - SIMPLE MED
62.5000 mL/h | INTRAVENOUS | Status: DC
  Filled 2012-11-08: qty 1000

## 2012-11-08 MED ORDER — OXYTOCIN BOLUS FROM INFUSION: 500.0000 mL | INTRAVENOUS | Status: DC

## 2012-11-08 MED ORDER — LIDOCAINE HCL (PF) 1 % IJ SOLN
INTRAMUSCULAR | Status: DC | PRN
  Administered 2012-11-08 (×2): 9 mL

## 2012-11-08 MED ORDER — LIDOCAINE HCL (PF) 1 % IJ SOLN
30.0000 mL | INTRAMUSCULAR | Status: DC | PRN
  Filled 2012-11-08 (×2): qty 30

## 2012-11-08 MED ORDER — EPHEDRINE 5 MG/ML INJ
10.0000 mg | INTRAVENOUS | Status: DC | PRN
  Filled 2012-11-08: qty 4
  Filled 2012-11-08: qty 2

## 2012-11-08 MED ORDER — LACTATED RINGERS IV SOLN: 500.0000 mL | Freq: Once | INTRAVENOUS | Status: DC

## 2012-11-08 MED ORDER — LACTATED RINGERS IV SOLN: 500.0000 mL | INTRAVENOUS | Status: DC | PRN

## 2012-11-08 MED ORDER — LACTATED RINGERS IV SOLN: INTRAVENOUS | Status: DC

## 2012-11-08 MED ORDER — FENTANYL 2.5 MCG/ML BUPIVACAINE 1/10 % EPIDURAL INFUSION (WH - ANES)
14.0000 mL/h | INTRAMUSCULAR | Status: DC | PRN
  Filled 2012-11-08: qty 125

## 2012-11-08 MED ORDER — FLEET ENEMA 7-19 GM/118ML RE ENEM: 1.0000 | ENEMA | RECTAL | Status: DC | PRN

## 2012-11-08 MED ORDER — BUTORPHANOL TARTRATE 1 MG/ML IJ SOLN
1.0000 mg | Freq: Once | INTRAMUSCULAR | Status: AC
  Administered 2012-11-08: 1 mg via INTRAVENOUS
  Filled 2012-11-08: qty 1

## 2012-11-08 MED ORDER — FENTANYL 2.5 MCG/ML BUPIVACAINE 1/10 % EPIDURAL INFUSION (WH - ANES)
INTRAMUSCULAR | Status: DC | PRN
  Administered 2012-11-08: 14 mL/h via EPIDURAL

## 2012-11-08 MED ORDER — ACETAMINOPHEN 325 MG PO TABS: 650.0000 mg | ORAL_TABLET | ORAL | Status: DC | PRN

## 2012-11-08 MED ORDER — IBUPROFEN 600 MG PO TABS
600.0000 mg | ORAL_TABLET | Freq: Four times a day (QID) | ORAL | Status: DC | PRN
  Administered 2012-11-09: 600 mg via ORAL
  Filled 2012-11-08: qty 1

## 2012-11-08 MED ORDER — CITRIC ACID-SODIUM CITRATE 334-500 MG/5ML PO SOLN
30.0000 mL | ORAL | Status: DC | PRN
  Filled 2012-11-08 (×2): qty 15

## 2012-11-08 MED ORDER — ONDANSETRON HCL 4 MG/2ML IJ SOLN: 4.0000 mg | Freq: Four times a day (QID) | INTRAMUSCULAR | Status: DC | PRN

## 2012-11-08 MED ORDER — OXYCODONE-ACETAMINOPHEN 5-325 MG PO TABS
1.0000 | ORAL_TABLET | ORAL | Status: DC | PRN
Start: 2012-11-08 — End: 2012-11-09

## 2012-11-08 NOTE — Progress Notes (Signed)
[facility] called regarding pt with c/o of labor. EFM applied, surveillance begun. Pt was apparently 4.5cm in office today.

## 2012-11-08 NOTE — Patient Instructions (Addendum)
Your induction is scheduled for 9/14 @ 8am. Go to [facility] hospital, Maternity Admissions Unit (Emergency) entrance and let them know you are there to be induced. They will send someone from Labor & Delivery to come get you.    Call the office or go to [facility] if:  You begin to have strong, frequent contractions  Your water breaks.  Sometimes it is a big gush of fluid, sometimes it is just a trickle that keeps getting your panties wet or running down your legs  You have vaginal bleeding.  It is normal to have a small amount of spotting if your cervix was checked.   You don't feel your baby moving like normal.  If you don't, get you something to eat and drink and lay down and focus on feeling your baby move.  You should feel at least 10 movements in 2 hours.  If you don't, you should call the office or go to [facility].   [facility] Contractions Pregnancy is commonly associated with contractions of the uterus throughout the pregnancy. Towards the end of pregnancy (32 to 34 weeks), these contractions [facility] Willa Rough) can develop more often and may become more forceful. This is not true labor because these contractions do not result in opening (dilatation) and thinning of the cervix. They are sometimes difficult to tell apart from true labor because these contractions can be forceful and people have different pain tolerances. You should not feel embarrassed if you go to the hospital with false labor. Sometimes, the only way to tell if you are in true labor is for your caregiver to follow the changes in the cervix. How to tell the difference between true and false labor:  False labor.  The contractions of false labor are usually shorter, irregular and not as hard as those of true labor.  They are often felt in the front of the lower abdomen and in the groin.  They may leave with walking around or changing positions while lying down.  They get weaker and are shorter lasting as time  goes on.  These contractions are usually irregular.  They do not usually become progressively stronger, regular and closer together as with true labor.  True labor.  Contractions in true labor last 30 to 70 seconds, become very regular, usually become more intense, and increase in frequency.  They do not go away with walking.  The discomfort is usually felt in the top of the uterus and spreads to the lower abdomen and low back.  True labor can be determined by your caregiver with an exam. This will show that the cervix is dilating and getting thinner. If there are no prenatal problems or other health problems associated with the pregnancy, it is completely safe to be sent home with false labor and await the onset of true labor. HOME CARE INSTRUCTIONS   Keep up with your usual exercises and instructions.  Take medications as directed.  Keep your regular prenatal appointment.  Eat and drink lightly if you think you are going into labor.  If BH contractions are making you uncomfortable:  Change your activity position from lying down or resting to walking/walking to resting.  Sit and rest in a tub of warm water.  Drink 2 to 3 glasses of water. Dehydration may cause B-H contractions.  Do slow and deep breathing several times an hour. SEEK IMMEDIATE MEDICAL CARE IF:   Your contractions continue to become stronger, more regular, and closer together.  You have  a gushing, burst or leaking of fluid from the vagina.  An oral temperature above 102 F (38.9 C) develops.  You have passage of blood-tinged mucus.  You develop vaginal bleeding.  You develop continuous belly (abdominal) pain.  You have low back pain that you never had before.  You feel the baby's head pushing down causing pelvic pressure.  The baby is not moving as much as it used to. Document Released: 02/20/2005 Document Revised: 05/15/2011 Document Reviewed: 08/14/2008 [facility] Patient Information 2014  [place], [place].

## 2012-11-08 NOTE — Progress Notes (Signed)
Patient requesting pain med, discussed with Dr. Macon Large, labor too advanced.

## 2012-11-08 NOTE — Anesthesia Preprocedure Evaluation (Signed)
Anesthesia Evaluation  Patient identified by MRN, date of birth, ID band Patient awake    Reviewed: Allergy & Precautions, H&P , NPO status , Patient's Chart, lab work & pertinent test results  Airway Mallampati: III TM Distance: >3 FB Neck ROM: full    Dental no notable dental hx.    Pulmonary neg pulmonary ROS,    Pulmonary exam normal       Cardiovascular negative cardio ROS      Neuro/Psych negative neurological ROS  negative psych ROS   GI/Hepatic negative GI ROS, Neg liver ROS,   Endo/Other  Morbid obesity  Renal/GU negative Renal ROS  negative genitourinary   Musculoskeletal   Abdominal (+) + obese,   Peds  Hematology negative hematology ROS (+)   Anesthesia Other Findings   Reproductive/Obstetrics (+) Pregnancy                           Anesthesia Physical Anesthesia Plan  ASA: III  Anesthesia Plan: Epidural   Post-op Pain Management:    Induction:   Airway Management Planned:   Additional Equipment:   Intra-op Plan:   Post-operative Plan:   Informed Consent: I have reviewed the patients History and Physical, chart, labs and discussed the procedure including the risks, benefits and alternatives for the proposed anesthesia with the patient or authorized representative who has indicated his/her understanding and acceptance.     Plan Discussed with:   Anesthesia Plan Comments:         Anesthesia Quick Evaluation

## 2012-11-08 NOTE — ED Notes (Signed)
Report called to Ginger, MAU RN

## 2012-11-08 NOTE — Anesthesia Procedure Notes (Addendum)
Epidural Patient location during procedure: OB Start time: 11/08/2012 9:39 PM End time: 11/08/2012 9:43 PM  Staffing Anesthesiologist: Sandrea Hughs Performed by: anesthesiologist   Preanesthetic Checklist Completed: patient identified, surgical consent, pre-op evaluation, timeout performed, IV checked, risks and benefits discussed and monitors and equipment checked  Epidural Patient position: sitting Prep: site prepped and draped and DuraPrep Patient monitoring: continuous pulse ox and blood pressure Approach: midline Injection technique: LOR air  Needle:  Needle type: Tuohy  Needle gauge: 17 G Needle length: 9 cm and 9 Needle insertion depth: 9 cm Catheter type: closed end flexible Catheter size: 19 Gauge Catheter at skin depth: 14 cm Test dose: negative and Other  Assessment Sensory level: T9 Events: blood not aspirated, injection not painful, no injection resistance, negative IV test and no paresthesia  Additional Notes Reason for block:procedure for pain

## 2012-11-08 NOTE — Progress Notes (Signed)
Reports good fm. Denies uc's, lof, vb, urinary frequency, urgency, hesitancy, or dysuria. Offered membrane sweeping, discussed r/b- pt decided to proceed, so membranes swept.  Reviewed labor s/s, fkc.  All questions answered. F/U in 1wk for visit.  IOL scheduled 9/14 @ 0800 for postdates if needed.

## 2012-11-08 NOTE — Telephone Encounter (Signed)
Preadmission screen  

## 2012-11-08 NOTE — ED Provider Notes (Signed)
CSN: [id number]     Arrival date & time 11/08/12  1718 History   First MD Initiated Contact with Patient 11/08/12 1733     Chief Complaint  Patient presents with  . Laboring   (Consider location/radiation/quality/duration/timing/severity/associated sxs/prior Treatment) HPI Kimberly Patrick is a 27 y.o. G2P1001 @ [redacted]w[redacted]d gestation who presents to the ED with contractions. She states that she went to her OB this morning and they striped her membranes. After lunch she started contracting and has continued. She came to [facility] ED because if it was not true labor she didn't want to go all the way to [place].   Past Medical History  Diagnosis Date  . Gallstones and inflammation of gallbladder without obstruction 03/28/2012  . Depression   . Heart palpitations    No past surgical history on file. Family History  Problem Relation Age of Onset  . Coronary artery disease Mother   . Hypertension Mother   . Breast cancer Cousin   . Diabetes Maternal Aunt   . Stroke Maternal Aunt    History  Substance Use Topics  . Smoking status: Current Every Day Smoker -- 0.50 packs/day    Types: Cigarettes  . Smokeless tobacco: Never Used  . Alcohol Use: Yes     Comment: occ. use. no alcohol since preg.   OB History   Grav Para Term Preterm Abortions TAB SAB Ect Mult Living   2 1 1       1      Review of Systems  Constitutional: Negative for fever.  Respiratory: Negative for shortness of breath.   Gastrointestinal: Positive for abdominal pain. Negative for nausea and vomiting.  Genitourinary: Positive for frequency. Negative for dysuria.  Musculoskeletal: Positive for back pain.  Skin: Negative for rash.  Psychiatric/Behavioral: The patient is not nervous/anxious.     Allergies  Review of patient's allergies indicates no known allergies.  Home Medications   Current Outpatient Rx  Name  Route  Sig  Dispense  Refill  . ferrous sulfate 325 (65 FE) MG tablet   Oral   Take 1 tablet (325 mg  total) by mouth 2 (two) times daily with a meal.   60 tablet   3   . HYDROcodone-acetaminophen (NORCO/VICODIN) 5-325 MG per tablet   Oral   Take 1 tablet by mouth every 8 (eight) hours as needed for pain.   10 tablet   0   . prenatal vitamin w/FE, FA (PRENATAL 1 + 1) 27-1 MG TABS   Oral   Take 1 tablet by mouth daily.          LMP 11/03/2011 Physical Exam  Nursing note and vitals reviewed. Constitutional: She is oriented to person, place, and time. She appears well-developed and well-nourished.  HENT:  Head: Normocephalic.  Eyes: EOM are normal.  Neck: Neck supple.  Pulmonary/Chest: Effort normal.  Abdominal:  Gravid consistent with dates.   Genitourinary:  External genitalia without lesions, cervix 5-6 cm, 60 % effaced, vertex with suture lines palpable.   Musculoskeletal: Normal range of motion.  Neurological: She is alert and oriented to person, place, and time. No cranial nerve deficit.  Skin: Skin is warm and dry.  Psychiatric: She has a normal mood and affect. Her behavior is normal.   Monitor tracing: Baseline Fetal heart rate 144, one contraction noted since arrival to the ED, reactive tracing.  ED Course: Discussed with Dr. Macon Large and will transport patient to [facility] via EMS.   Procedures  Monitor tracing transmitted to  [facility].  MDM  27 y.o. female with early possible early labor. Stable for transport to [facility] hospital for further evaluation.  Discussed with the patient and all questioned fully answered. She agrees with plan of care.   Tibes, NP 11/08/12 [address] Orlene Och, NP 11/08/12 1840

## 2012-11-08 NOTE — ED Notes (Addendum)
Patient seen by [facility] today was 4 cm dilated.  Membranes stripped.  Began having contractions around 1200, nonregular.  Now coming 5 min apart.  6 cm dilated per Kerrie Buffalo, NP. Gravida 2, Para 1.  First child was induced labor, but vaginal delivery, full-term, no complications.  Hypertensive during this pregnancy

## 2012-11-08 NOTE — H&P (Signed)
Obstetric History and Physical  BERDENA CISEK is a 27 y.o. G2P1001 with IUP at [redacted]w[redacted]d presenting with active labor.  Patient was seen at [facility] earlier today and her membranes were swept, and her cervix was 4/80/-1.  She then presented to [facility] ER with frequent contractions. On examination, she was found to be about 6 cm dilated.  The plan was made to transfer her to [facility] L&D.  Patient  Denies vaginal bleeding,LOF but endorses fetal movement.   Prenatal Course Source of Care: [facility]  with onset of care at 15 weeks Pregnancy complications or risks: Patient Active Problem List   Diagnosis Date Noted  . Supervision of other normal pregnancy 06/20/2012  . Gallstones and inflammation of gallbladder without obstruction   She plans to bottle feed She desires Depo-Provera for postpartum contraception.   Prenatal labs and studies: ABO, Rh:  O POS Antibody: NEG (06/12 0923) Rubella: Immune (01/23 0000) RPR: NON REAC (06/12 0923)  HBsAg: Negative (01/23 0000)  HIV: NON REACTIVE (06/12 0923)  WUJ:WJXBJYNW (08/15 1236) 2 hr Glucola normal: 82/115/114  Genetic screening normal Anatomy US normal  Prenatal Transfer Tool  Maternal Diabetes: No Genetic Screening: Normal Maternal Ultrasounds/Referrals: Normal Fetal Ultrasounds or other Referrals:  None Maternal Substance Abuse:  No Significant Maternal Medications:  None Significant Maternal Lab Results: Lab values include: Group B Strep negative  Past Medical History  Diagnosis Date  . Gallstones and inflammation of gallbladder without obstruction 03/28/2012  . Depression   . Heart palpitations     History reviewed. No pertinent past surgical history.  OB History  Gravida Para Term Preterm AB SAB TAB Ectopic Multiple Living  2 1 1       1     # Outcome Date GA Lbr Len/2nd Weight Sex Delivery Anes PTL Lv  2 CUR           1 TRM 08/27/06 [redacted]w[redacted]d 24:00 7 lb 2 oz (3.232 kg) M SVD None N Y      History   Social  History  . Marital Status: Single    Spouse Name: N/A    Number of Children: N/A  . Years of Education: N/A   Social History Main Topics  . Smoking status: Current Every Day Smoker -- 0.50 packs/day    Types: Cigarettes  . Smokeless tobacco: Never Used  . Alcohol Use: Yes     Comment: occ. use. no alcohol since preg.  . Drug Use: No  . Sexual Activity: Not Currently    Birth Control/ Protection: None   Other Topics Concern  . None   Social History Narrative  . None    Family History  Problem Relation Age of Onset  . Coronary artery disease Mother   . Hypertension Mother   . Breast cancer Cousin   . Diabetes Maternal Aunt   . Stroke Maternal Aunt     Prescriptions prior to admission  Medication Sig Dispense Refill  . ferrous sulfate 325 (65 FE) MG tablet Take 1 tablet (325 mg total) by mouth 2 (two) times daily with a meal.  60 tablet  3  . HYDROcodone-acetaminophen (NORCO/VICODIN) 5-325 MG per tablet Take 1 tablet by mouth every 8 (eight) hours as needed for pain.  10 tablet  0  . prenatal vitamin w/FE, FA (PRENATAL 1 + 1) 27-1 MG TABS Take 1 tablet by mouth daily.        No Known Allergies  Review of Systems: Negative except for what is  mentioned in HPI.  Physical Exam: BP 155/92  Pulse 89  Temp(Src) 98.1 F (36.7 C) (Oral)  Resp 20  Ht 5\' 6"  (1.676 m)  SpO2 99%  LMP 11/03/2011 GENERAL: Well-developed, well-nourished female in no acute distress.  LUNGS: Clear to auscultation bilaterally.  HEART: Regular rate and rhythm. ABDOMEN: Soft, nontender, nondistended, gravid. EXTREMITIES: Nontender, no edema, 2+ distal pulses. Cervical Exam: Dilatation 7cm   Effacement 90% Station +1 by Candise Che, RN Presentation: cephalic FHT:  Baseline rate 135 bpm   Variability moderate  Accelerations present   Decelerations none Contractions:q 2-3 mins   Pertinent Labs/Studies:   Results for orders placed in visit on 11/08/12 (from the past 24 hour(s))  POCT URINALYSIS  DIPSTICK     Status: None   Collection Time    11/08/12 10:17 AM      Result Value Range   Color, UA       Clarity, UA       Glucose, UA neg     Bilirubin, UA       Ketones, UA neg     Spec Grav, UA       Blood, UA neg     pH, UA       Protein, UA trace     Urobilinogen, UA       Nitrite, UA neg     Leukocytes, UA small (1+)      Assessment : MERYLE PUGMIRE is a 27 y.o. G2P1001 at [redacted]w[redacted]d being admitted for active labor.  Plan: Labor: Expectant management. Augmentation as needed, per protocol. Analgesia as needed. FWB: Reassuring fetal heart tracing.  GBS negative Delivery plan: Hopeful for vaginal delivery  Jaynie Collins, MD, FACOG Attending Obstetrician & Gynecologist Faculty Practice, [facility]

## 2012-11-08 NOTE — Progress Notes (Signed)
Received call from [facility] Carl, NP) about patient being in active labor, about 6 cm dilated.  This is her second pregnancy.  Membranes were swept earlier today in [facility].  She is GBS negative.  She is being sent here to [facility] via [facility], she will be a direct admit to L&D.  L&D nurse in charge and Admitting aware.  Tereso Newcomer, MD 11/08/2012 5:45 PM

## 2012-11-08 NOTE — ED Provider Notes (Signed)
Medical screening examination/treatment/procedure(s) were conducted as a shared visit with non-physician practitioner(s) and myself.  I personally evaluated the patient during the encounter Pt [redacted] wks pregnant.  Vaginal exam shows pt 90% effaced and 6 cm dialated  Benny Lennert, MD 11/08/12 2120

## 2012-11-08 NOTE — Progress Notes (Signed)
Kimberly Patrick is a 27 y.o. G2P1001 at [redacted]w[redacted]d   Subjective: Comfortable with epidural  Objective: BP 140/85  Pulse 86  Temp(Src) 98.1 F (36.7 C) (Oral)  Resp 18  Ht 5\' 6"  (1.676 m)  SpO2 99%  LMP 11/03/2011      FHT:  FHR: 120-130 bpm, variability: moderate,  accelerations:  Present,  decelerations:  Absent +scalp stim UC:   regular, every 2-3 minutes, spontaneous SVE:   Dilation: 9 Effacement (%): 90 Station: -1 Exam by:: Pincus Badder CNM SROM with exam, sm clear fluid  Labs: Lab Results  Component Value Date   WBC 13.6* 11/08/2012   HGB 10.9* 11/08/2012   HCT 33.2* 11/08/2012   MCV 85.3 11/08/2012   PLT 225 11/08/2012    Assessment / Plan: Active labor  Anticipate SVD Await urge to push, or check cx in 1-2 hrs  SHAW, KIMBERLY 11/08/2012, 11:03 PM

## 2012-11-09 ENCOUNTER — Encounter (HOSPITAL_COMMUNITY): Payer: Self-pay | Admitting: *Deleted

## 2012-11-09 LAB — RPR: RPR Ser Ql: NONREACTIVE

## 2012-11-09 MED ORDER — IBUPROFEN 600 MG PO TABS
600.0000 mg | ORAL_TABLET | Freq: Four times a day (QID) | ORAL | Status: DC
  Administered 2012-11-09 – 2012-11-10 (×6): 600 mg via ORAL
  Filled 2012-11-09 (×6): qty 1

## 2012-11-09 MED ORDER — BENZOCAINE-MENTHOL 20-0.5 % EX AERO: 1.0000 "application " | INHALATION_SPRAY | CUTANEOUS | Status: DC | PRN

## 2012-11-09 MED ORDER — OXYCODONE-ACETAMINOPHEN 5-325 MG PO TABS: 1.0000 | ORAL_TABLET | ORAL | Status: DC | PRN

## 2012-11-09 MED ORDER — ONDANSETRON HCL 4 MG PO TABS: 4.0000 mg | ORAL_TABLET | ORAL | Status: DC | PRN

## 2012-11-09 MED ORDER — LANOLIN HYDROUS EX OINT: TOPICAL_OINTMENT | CUTANEOUS | Status: DC | PRN

## 2012-11-09 MED ORDER — TETANUS-DIPHTH-ACELL PERTUSSIS 5-2.5-18.5 LF-MCG/0.5 IM SUSP
0.5000 mL | Freq: Once | INTRAMUSCULAR | Status: AC
  Administered 2012-11-09: 0.5 mL via INTRAMUSCULAR

## 2012-11-09 MED ORDER — ONDANSETRON HCL 4 MG/2ML IJ SOLN: 4.0000 mg | INTRAMUSCULAR | Status: DC | PRN

## 2012-11-09 MED ORDER — MENTHOL 3 MG MT LOZG
1.0000 | LOZENGE | OROMUCOSAL | Status: DC | PRN
  Filled 2012-11-09: qty 9

## 2012-11-09 MED ORDER — DIPHENHYDRAMINE HCL 25 MG PO CAPS: 25.0000 mg | ORAL_CAPSULE | Freq: Four times a day (QID) | ORAL | Status: DC | PRN

## 2012-11-09 MED ORDER — WITCH HAZEL-GLYCERIN EX PADS: 1.0000 "application " | MEDICATED_PAD | CUTANEOUS | Status: DC | PRN

## 2012-11-09 MED ORDER — DIBUCAINE 1 % RE OINT: 1.0000 "application " | TOPICAL_OINTMENT | RECTAL | Status: DC | PRN

## 2012-11-09 MED ORDER — ZOLPIDEM TARTRATE 5 MG PO TABS: 5.0000 mg | ORAL_TABLET | Freq: Every evening | ORAL | Status: DC | PRN

## 2012-11-09 MED ORDER — SENNOSIDES-DOCUSATE SODIUM 8.6-50 MG PO TABS: 2.0000 | ORAL_TABLET | Freq: Every day | ORAL | Status: DC

## 2012-11-09 MED ORDER — PRENATAL MULTIVITAMIN CH
1.0000 | ORAL_TABLET | Freq: Every day | ORAL | Status: DC
  Administered 2012-11-09 – 2012-11-10 (×2): 1 via ORAL
  Filled 2012-11-09: qty 1

## 2012-11-09 MED ORDER — SIMETHICONE 80 MG PO CHEW: 80.0000 mg | CHEWABLE_TABLET | ORAL | Status: DC | PRN

## 2012-11-09 NOTE — Progress Notes (Signed)
Post Partum Day 1 Subjective: no complaints, up ad lib, voiding, tolerating PO and + flatus  Objective: Blood pressure 129/70, pulse 76, temperature 97.4 F (36.3 C), temperature source Oral, resp. rate 16, height 5\' 6"  (1.676 m), weight 248 lb (112.492 kg), last menstrual period 11/03/2011, SpO2 100.00%, unknown if currently breastfeeding.  Physical Exam:  General: alert and no distress Lochia: appropriate Uterine Fundus: firm DVT Evaluation: No evidence of DVT seen on physical exam.   Recent Labs  11/08/12 2040  HGB 10.9*  HCT 33.2*    Assessment/Plan: Plan for discharge tomorrow and Contraception undecided.  Bottlefeeding   LOS: 1 day   Tereso Newcomer, M.D. 11/09/2012, 8:28 AM

## 2012-11-09 NOTE — Anesthesia Postprocedure Evaluation (Signed)
Anesthesia Post Note  Patient: Kimberly Patrick  Procedure(s) Performed: * No procedures listed *  Anesthesia type: Epidural  Patient location: Mother/Baby  Post pain: Pain level controlled  Post assessment: Post-op Vital signs reviewed  Last Vitals:  Filed Vitals:   11/09/12 0345  BP: 129/70  Pulse: 76  Temp: 36.3 C  Resp: 16    Post vital signs: Reviewed  Level of consciousness:alert  Complications: No apparent anesthesia complications

## 2012-11-10 MED ORDER — IBUPROFEN 600 MG PO TABS: 600.0000 mg | ORAL_TABLET | Freq: Four times a day (QID) | ORAL | Status: DC | PRN

## 2012-11-10 NOTE — Discharge Summary (Signed)
Obstetric Discharge Summary Reason for Admission: onset of labor Prenatal Procedures: ultrasound Intrapartum Procedures: spontaneous vaginal delivery Postpartum Procedures: none Complications-Operative and Postpartum: none Eating, drinking, voiding, ambulating well.  +flatus.  Lochia and pain wnl.  Denies dizziness, lightheadedness, or sob. No complaints.  Hemoglobin  Date Value Range Status  11/08/2012 10.9* 12.0 - 15.0 g/dL Final  1/61/0960 45.4   Final     HCT  Date Value Range Status  11/08/2012 33.2* 36.0 - 46.0 % Final  03/28/2012 33   Final    Physical Exam:  General: alert, cooperative and no distress Lochia: appropriate Uterine Fundus: firm Incision: n/a DVT Evaluation: No evidence of DVT seen on physical exam. Negative Homan's sign. No cords or calf tenderness. No significant calf/ankle edema.  Discharge Diagnoses: Term Pregnancy-delivered  Discharge Information: Date: 11/10/2012 Activity: pelvic rest Diet: routine Medications: PNV, Ibuprofen and Iron Condition: stable Instructions: refer to practice specific booklet Discharge to: home Follow-up Information   Follow up with [facility]. Schedule an appointment as soon as possible for a visit in 6 weeks. (for postpartum visit)    Specialty:  Obstetrics and Gynecology   Contact information:   [address] [place] [address] [phone]      Newborn Data: Live born female  Birth Weight: 6 lb 12.6 oz (3079 g) APGAR: 9, 9  Home with mother. Bottlefeeding, depo for contraception, circumcision at FT  Marge Duncans 11/10/2012, 6:25 AM

## 2012-11-11 NOTE — Progress Notes (Signed)
Post discharge chart review completed.  

## 2012-11-15 ENCOUNTER — Encounter: Payer: Medicaid Other | Admitting: Obstetrics & Gynecology

## 2012-11-17 ENCOUNTER — Inpatient Hospital Stay (HOSPITAL_COMMUNITY): Admission: RE | Admit: 2012-11-17 | Payer: Medicaid Other | Source: Ambulatory Visit

## 2012-12-20 ENCOUNTER — Ambulatory Visit: Payer: Medicaid Other | Admitting: Obstetrics & Gynecology

## 2012-12-20 ENCOUNTER — Encounter: Payer: Self-pay | Admitting: Obstetrics & Gynecology

## 2012-12-20 ENCOUNTER — Encounter (INDEPENDENT_AMBULATORY_CARE_PROVIDER_SITE_OTHER): Payer: Self-pay

## 2012-12-20 ENCOUNTER — Ambulatory Visit (INDEPENDENT_AMBULATORY_CARE_PROVIDER_SITE_OTHER): Payer: Medicaid Other | Admitting: Obstetrics & Gynecology

## 2012-12-20 MED ORDER — MEDROXYPROGESTERONE ACETATE 150 MG/ML IM SUSP: 150.0000 mg | INTRAMUSCULAR | Status: DC

## 2012-12-20 NOTE — Progress Notes (Signed)
Patient ID: Kimberly Patrick, female   DOB: 06/17/1985, 27 y.o.   MRN: [id number] Delivery Note  Pt pushed well and at 11:41 PM a viable female was delivered via Vaginal, Spontaneous Delivery (Presentation: ; ). APGAR: 9, 9 ; weight: 6+12.6 . Infant placed on pt's abd; dried. Cord clamped and cut by pt's mother. Hospital cord blood sample collected. Placenta status: Intact, Spontaneous. Cord: 3 vessel.  Anesthesia: Epidural  Episiotomy: none  Lacerations: none  Est. Blood Loss (mL): 200  Mom to postpartum. Baby to nursery-stable.  Cam Hai  11/09/2012, 12:05 AM   Has had menses No complaints No stitches  Wants depo No sex at all  Exam Normal involution  Depo provera prescribed  Follow up Monday for injection

## 2012-12-23 ENCOUNTER — Ambulatory Visit: Payer: Medicaid Other

## 2013-11-29 ENCOUNTER — Emergency Department (HOSPITAL_COMMUNITY)
Admission: EM | Admit: 2013-11-29 | Discharge: 2013-11-29 | Disposition: A | Discharge status: Home / Self Care | Payer: Medicaid Other | Attending: Emergency Medicine | Admitting: Emergency Medicine

## 2013-11-29 ENCOUNTER — Encounter (HOSPITAL_COMMUNITY): Payer: Self-pay | Admitting: Emergency Medicine

## 2013-11-29 DIAGNOSIS — F17200 Nicotine dependence, unspecified, uncomplicated: Secondary | ICD-10-CM | POA: Diagnosis not present

## 2013-11-29 DIAGNOSIS — Z8659 Personal history of other mental and behavioral disorders: Secondary | ICD-10-CM | POA: Insufficient documentation

## 2013-11-29 DIAGNOSIS — J029 Acute pharyngitis, unspecified: Secondary | ICD-10-CM | POA: Diagnosis not present

## 2013-11-29 DIAGNOSIS — Z8719 Personal history of other diseases of the digestive system: Secondary | ICD-10-CM | POA: Diagnosis not present

## 2013-11-29 LAB — RAPID STREP SCREEN (MED CTR MEBANE ONLY): Streptococcus, Group A Screen (Direct): NEGATIVE

## 2013-11-29 LAB — CBG MONITORING, ED: GLUCOSE-CAPILLARY: 81 mg/dL (ref 70–99)

## 2013-11-29 MED ORDER — MAGIC MOUTHWASH W/LIDOCAINE: 10.0000 mL | Freq: Four times a day (QID) | ORAL | Status: DC | PRN

## 2013-11-29 MED ORDER — IBUPROFEN 600 MG PO TABS: 600.0000 mg | ORAL_TABLET | Freq: Four times a day (QID) | ORAL | Status: DC | PRN

## 2013-11-29 MED ORDER — IBUPROFEN 800 MG PO TABS
800.0000 mg | ORAL_TABLET | Freq: Once | ORAL | Status: AC
  Administered 2013-11-29: 800 mg via ORAL
  Filled 2013-11-29: qty 1

## 2013-11-29 NOTE — ED Provider Notes (Signed)
CSN: [id number]     Arrival date & time 11/29/13  1253 History   First MD Initiated Contact with Patient 11/29/13 1326     Chief Complaint  Patient presents with  . Sore Throat     (Consider location/radiation/quality/duration/timing/severity/associated sxs/prior Treatment) The history is provided by the patient.   Kimberly Patrick is a 28 y.o. female presenting with sore throat which started this morning with waking.  Pain is worsened with swallowing. She reports subjective fever. She denies nasal congestion, headache, drainage, ear pain, cough, shortness of breath, chest pain.  She has been able to swallow liquids but has had no solid intake today.  Additionally, reports having a pins a needles sensation intermittently for the past month in her feet which is worse today.  She denies pain in her feet, also denies back pain or injury. She is not diabetic.  She has not taken any medicines for her throat pain today.    Past Medical History  Diagnosis Date  . Gallstones and inflammation of gallbladder without obstruction 03/28/2012  . Depression   . Heart palpitations    History reviewed. No pertinent past surgical history. Family History  Problem Relation Age of Onset  . Coronary artery disease Mother   . Hypertension Mother   . Breast cancer Cousin   . Diabetes Maternal Aunt   . Stroke Maternal Aunt    History  Substance Use Topics  . Smoking status: Current Every Day Smoker -- 0.50 packs/day    Types: Cigarettes  . Smokeless tobacco: Never Used  . Alcohol Use: Yes     Comment: occ. use. no alcohol since preg.   OB History   Grav Para Term Preterm Abortions TAB SAB Ect Mult Living   Review of Systems  Constitutional: Positive for fever. Negative for chills.  HENT: Positive for sore throat. Negative for congestion, ear pain, rhinorrhea, sinus pressure, trouble swallowing and voice change.   Eyes: Negative for discharge.  Respiratory: Negative for  cough, shortness of breath, wheezing and stridor.   Cardiovascular: Negative for chest pain.  Gastrointestinal: Negative for abdominal pain.  Genitourinary: Negative.   Neurological: Negative for speech difficulty.      Allergies  Review of patient's allergies indicates no known allergies.  Home Medications   Prior to Admission medications   Medication Sig Start Date End Date Taking? Authorizing Provider  Alum & Mag Hydroxide-Simeth (MAGIC MOUTHWASH W/LIDOCAINE) SOLN Take 10 mLs by mouth 4 (four) times daily as needed (throat pain). 11/29/13   Burgess Amor, PA-C  ibuprofen (ADVIL,MOTRIN) 600 MG tablet Take 1 tablet (600 mg total) by mouth every 6 (six) hours as needed. 11/29/13   Burgess Amor, PA-C  medroxyPROGESTERone (DEPO-PROVERA) 150 MG/ML injection Inject 1 mL (150 mg total) into the muscle every 3 (three) months. 12/20/12   Lazaro Arms, MD   BP 137/75  Pulse 89  Temp(Src) 100.2 F (37.9 C) (Oral)  Resp 16  Ht  (1.676 m)  Wt 233 lb (105.688 kg)  BMI 37.63 kg/m2  SpO2 100%  LMP 11/27/2013 Physical Exam  Constitutional: She is oriented to person, place, and time. She appears well-developed and well-nourished.  HENT:  Head: Normocephalic and atraumatic.  Right Ear: Tympanic membrane and ear canal normal.  Left Ear: Tympanic membrane and ear canal normal.  Nose: No mucosal edema or rhinorrhea.  Mouth/Throat: Uvula is midline and mucous membranes are normal. Posterior oropharyngeal  erythema present. No oropharyngeal exudate, posterior oropharyngeal edema or tonsillar abscesses.  Eyes: Conjunctivae are normal.  Neck: No edema and normal range of motion present.  Cardiovascular: Normal rate and normal heart sounds.   Pulses:      Dorsalis pedis pulses are 2+ on the right side, and 2+ on the left side.  Pulmonary/Chest: Effort normal and breath sounds normal. No respiratory distress. She has no decreased breath sounds. She has no wheezes. She has no rhonchi. She has no rales.   Abdominal: Soft. There is no tenderness.  Musculoskeletal: Normal range of motion.  Lymphadenopathy:    She has no cervical adenopathy.  Neurological: She is alert and oriented to person, place, and time. She has normal strength. No sensory deficit.  Normal and equal strength in lower extremities.  Less than 2 cap refill in toes.  Skin: Skin is warm and dry. No rash noted.  Psychiatric: She has a normal mood and affect.    ED Course  Procedures (including critical care time) Labs Review Labs Reviewed  RAPID STREP SCREEN  CULTURE, GROUP A STREP  CBG MONITORING, ED    Imaging Review No results found.   EKG Interpretation None      MDM   Final diagnoses:  Viral pharyngitis    Pt with viral pharyngitis, rapid strep negative, culture pending.  Intermittent tingling intoes, chronic of unclear etiology.  cbg normal.  Good cap refill, no vascular compromise, no sx currently .  Encouraged rest, increased fluid intake, prescribed magic mouthwash, ibuprofen. F/u with pcp if tingling persists or worsens.   Burgess Amor, PA-C 11/29/13 2146

## 2013-11-29 NOTE — Discharge Instructions (Signed)
Salt Water Gargle °This solution will help make your mouth and throat feel better. °HOME CARE INSTRUCTIONS  °· Mix 1 teaspoon of salt in 8 ounces of warm water. °· Gargle with this solution as much or often as you need or as directed. Swish and gargle gently if you have any sores or wounds in your mouth. °· Do not swallow this mixture. °Document Released: 11/25/2003 Document Revised: 05/15/2011 Document Reviewed: 04/17/2008 °ExitCare® Patient Information ©2015 [employer]. This information is not intended to replace advice given to you by your health care provider. Make sure you discuss any questions you have with your health care provider. °Strep Throat °Strep throat is an infection of the throat caused by a bacteria named Streptococcus pyogenes. Your health care provider may call the infection streptococcal "tonsillitis" or "pharyngitis" depending on whether there are signs of inflammation in the tonsils or back of the throat. Strep throat is most common in children aged 5-15 years during the cold months of the year, but it can occur in people of any age during any season. This infection is spread from person to person (contagious) through coughing, sneezing, or other close contact. °SIGNS AND SYMPTOMS  °· Fever or chills. °· Painful, swollen, red tonsils or throat. °· Pain or difficulty when swallowing. °· White or yellow spots on the tonsils or throat. °· Swollen, tender lymph nodes or "glands" of the neck or under the jaw. °· Red rash all over the body (rare). °DIAGNOSIS  °Many different infections can cause the same symptoms. A test must be done to confirm the diagnosis so the right treatment can be given. A "rapid strep test" can help your health care provider make the diagnosis in a few minutes. If this test is not available, a light swab of the infected area can be used for a throat culture test. If a throat culture test is done, results are usually available in a day or two. °TREATMENT  °Strep throat is  treated with antibiotic medicine. °HOME CARE INSTRUCTIONS  °· Gargle with 1 tsp of salt in 1 cup of warm water, 3-4 times per day or as needed for comfort. °· Family members who also have a sore throat or fever should be tested for strep throat and treated with antibiotics if they have the strep infection. °· Make sure everyone in your household washes their hands well. °· Do not share food, drinking cups, or personal items that could cause the infection to spread to others. °· You may need to eat a soft food diet until your sore throat gets better. °· Drink enough water and fluids to keep your urine clear or pale yellow. This will help prevent dehydration. °· Get plenty of rest. °· Stay home from school, day care, or work until you have been on antibiotics for 24 hours. °· Take medicines only as directed by your health care provider. °· Take your antibiotic medicine as directed by your health care provider. Finish it even if you start to feel better. °SEEK MEDICAL CARE IF:  °· The glands in your neck continue to enlarge. °· You develop a rash, cough, or earache. °· You cough up green, yellow-brown, or bloody sputum. °· You have pain or discomfort not controlled by medicines. °· Your problems seem to be getting worse rather than better. °· You have a fever. °SEEK IMMEDIATE MEDICAL CARE IF:  °· You develop any new symptoms such as vomiting, severe headache, stiff or painful neck, chest pain, shortness of breath, or trouble   swallowing. °· You develop severe throat pain, drooling, or changes in your voice. °· You develop swelling of the neck, or the skin on the neck becomes red and tender. °· You develop signs of dehydration, such as fatigue, dry mouth, and decreased urination. °· You become increasingly sleepy, or you cannot wake up completely. °MAKE SURE YOU: °· Understand these instructions. °· Will watch your condition. °· Will get help right away if you are not doing well or get worse. °Document Released:  02/18/2000 Document Revised: 07/07/2013 Document Reviewed: 04/21/2010 °ExitCare® Patient Information ©2015 [employer]. This information is not intended to replace advice given to you by your health care provider. Make sure you discuss any questions you have with your health care provider. ° °

## 2013-11-29 NOTE — ED Notes (Signed)
Sore throat w/talking or swallowing began this morning. Denies sinus congestion.  Reports "pins and needles" sensation in feet, intermittently for months. No known LE or back injuries.  Plantar and dorsiflexion intact and able to feel touch to feet.

## 2013-11-29 NOTE — ED Notes (Signed)
Patient with no complaints at this time. Respirations even and unlabored. Skin warm/dry. Discharge instructions reviewed with patient at this time. Patient given opportunity to voice concerns/ask questions. Patient discharged at this time and left Emergency Department with steady gait.   

## 2013-11-29 NOTE — ED Notes (Signed)
Pt states that she woke up this morning and felt like it was hard for her to swallow and that her feet have been tingling over the past month but gotten worse today. Upon inspection, no posterior throat swelling. Denies any nasal drainage.

## 2013-12-01 LAB — CULTURE, GROUP A STREP

## 2013-12-04 NOTE — ED Provider Notes (Signed)
Medical screening examination/treatment/procedure(s) were performed by non-physician practitioner and as supervising physician I was immediately available for consultation/collaboration.   EKG Interpretation None      Deshundra Waller, MD, FACEP   Garth Diffley L Jeanpierre Thebeau, MD 12/04/13 1505 

## 2014-01-05 ENCOUNTER — Encounter (HOSPITAL_COMMUNITY): Payer: Self-pay | Admitting: Emergency Medicine

## 2014-06-07 ENCOUNTER — Encounter (HOSPITAL_COMMUNITY): Payer: Self-pay | Admitting: Cardiology

## 2014-06-07 ENCOUNTER — Emergency Department (HOSPITAL_COMMUNITY)
Admission: EM | Admit: 2014-06-07 | Discharge: 2014-06-07 | Disposition: A | Discharge status: Home / Self Care | Payer: Medicaid Other | Attending: Emergency Medicine | Admitting: Emergency Medicine

## 2014-06-07 DIAGNOSIS — Z720 Tobacco use: Secondary | ICD-10-CM | POA: Diagnosis not present

## 2014-06-07 DIAGNOSIS — H5462 Unqualified visual loss, left eye, normal vision right eye: Secondary | ICD-10-CM | POA: Insufficient documentation

## 2014-06-07 DIAGNOSIS — E119 Type 2 diabetes mellitus without complications: Secondary | ICD-10-CM | POA: Diagnosis not present

## 2014-06-07 DIAGNOSIS — I10 Essential (primary) hypertension: Secondary | ICD-10-CM | POA: Diagnosis not present

## 2014-06-07 DIAGNOSIS — H538 Other visual disturbances: Secondary | ICD-10-CM | POA: Diagnosis present

## 2014-06-07 DIAGNOSIS — Z793 Long term (current) use of hormonal contraceptives: Secondary | ICD-10-CM | POA: Diagnosis not present

## 2014-06-07 DIAGNOSIS — Z79899 Other long term (current) drug therapy: Secondary | ICD-10-CM | POA: Diagnosis not present

## 2014-06-07 DIAGNOSIS — Z8719 Personal history of other diseases of the digestive system: Secondary | ICD-10-CM | POA: Diagnosis not present

## 2014-06-07 DIAGNOSIS — Z8659 Personal history of other mental and behavioral disorders: Secondary | ICD-10-CM | POA: Insufficient documentation

## 2014-06-07 HISTORY — DX: Essential (primary) hypertension: I10

## 2014-06-07 MED ORDER — TETRACAINE HCL 0.5 % OP SOLN
2.0000 [drp] | Freq: Once | OPHTHALMIC | Status: AC
  Administered 2014-06-07: 2 [drp] via OPHTHALMIC
  Filled 2014-06-07: qty 2

## 2014-06-07 MED ORDER — HOMATROPINE HBR 5 % OP SOLN
1.0000 [drp] | Freq: Once | OPHTHALMIC | Status: AC
  Administered 2014-06-07: 1 [drp] via OPHTHALMIC
  Filled 2014-06-07: qty 5

## 2014-06-07 NOTE — ED Notes (Signed)
Blurred vision in left eye times 3 days.  Vision has been progressively getting worse.  States that now her vision is almost gone in the left eye.

## 2014-06-07 NOTE — ED Provider Notes (Signed)
CSN: [id number]     Arrival date & time 06/07/14  1439 History   First MD Initiated Contact with Patient 06/07/14 1550     Chief Complaint  Patient presents with  . Blurred Vision     (Consider location/radiation/quality/duration/timing/severity/associated sxs/prior Treatment) HPI   Kimberly Patrick is a 29 y.o. female who presents for evaluation of gradually worse. Patient in the left eye over 4 days time. For 2 days. She has had complete vision loss, and only sees darkness when she looks out of her left eye. She feels like this is a central process, and that she is able to see a tiny bit, on the edges of the visual field of the left eye. There's been no known trauma. She denies drainage from the eye. She denies eye pain, headache, weakness, dizziness, nausea, vomiting, cough, shortness of breath or chest pain. There had this previously. There is a family history of glaucoma. There are no other known modifying factors.  Past Medical History  Diagnosis Date  . Gallstones and inflammation of gallbladder without obstruction 03/28/2012  . Depression   . Heart palpitations   . Hypertension   . Diabetes mellitus without complication    History reviewed. No pertinent past surgical history. Family History  Problem Relation Age of Onset  . Coronary artery disease Mother   . Hypertension Mother   . Breast cancer Cousin   . Diabetes Maternal Aunt   . Stroke Maternal Aunt    History  Substance Use Topics  . Smoking status: Current Every Day Smoker -- 0.50 packs/day    Types: Cigarettes  . Smokeless tobacco: Never Used  . Alcohol Use: Yes     Comment: occ. use. no alcohol since preg.   OB History    Gravida Para Term Preterm AB TAB SAB Ectopic Multiple Living   2 2 2       2      Review of Systems  All other systems reviewed and are negative.     Allergies  Review of patient's allergies indicates no known allergies.  Home Medications   Prior to Admission medications    Medication Sig Start Date End Date Taking? Authorizing Provider  Hyprom-Naphaz-Polysorb-Zn Sulf (CLEAR EYES COMPLETE OP) Apply 1 drop to eye as needed (FOR EYE IRRITATION).   Yes Historical Provider, MD  lisinopril-hydrochlorothiazide (PRINZIDE,ZESTORETIC) 10-12.5 MG per tablet Take 1 tablet by mouth daily.   Yes Historical Provider, MD  medroxyPROGESTERone (DEPO-PROVERA) 150 MG/ML injection Inject 1 mL (150 mg total) into the muscle every 3 (three) months. 12/20/12  Yes Lazaro ArmsLuther H Eure, MD  Alum & Mag Hydroxide-Simeth (MAGIC MOUTHWASH W/LIDOCAINE) SOLN Take 10 mLs by mouth 4 (four) times daily as needed (throat pain). Patient not taking: Reported on 06/07/2014 11/29/13   Burgess AmorJulie Idol, PA-C  ibuprofen (ADVIL,MOTRIN) 600 MG tablet Take 1 tablet (600 mg total) by mouth every 6 (six) hours as needed. Patient not taking: Reported on 06/07/2014 11/29/13   Burgess AmorJulie Idol, PA-C   BP 125/71 mmHg  Pulse 82  Temp(Src) 98.1 F (36.7 C) (Oral)  Resp 17  Ht 5' 7.5" (1.715 m)  Wt 235 lb (106.595 kg)  BMI 36.24 kg/m2  SpO2 100%  LMP  Physical Exam  Constitutional: She is oriented to person, place, and time. She appears well-developed and well-nourished.  HENT:  Head: Normocephalic and atraumatic.  Right Ear: External ear normal.  Left Ear: External ear normal.  Eyes: Conjunctivae and EOM are normal. Pupils are equal, round, and reactive to  light.  Pupils are equal and reactive bilaterally at 4 mm. No opacity or hyphema of the left eye. No conjunctival irritation of the left eye. External ocular muscles are intact bilaterally. Ocular light reflex is symmetric bilaterally.  Neck: Normal range of motion and phonation normal. Neck supple.  Cardiovascular: Normal rate, regular rhythm and normal heart sounds.   Pulmonary/Chest: Effort normal and breath sounds normal. She exhibits no bony tenderness.  Abdominal: Soft. There is no tenderness.  Musculoskeletal: Normal range of motion.  Neurological: She is alert and  oriented to person, place, and time. No cranial nerve deficit or sensory deficit. She exhibits normal muscle tone. Coordination normal.  Skin: Skin is warm, dry and intact.  Psychiatric: She has a normal mood and affect. Her behavior is normal. Judgment and thought content normal.  Nursing note and vitals reviewed.   ED Course  Procedures (including critical care time)  Medications  tetracaine (PONTOCAINE) 0.5 % ophthalmic solution 2 drop (2 drops Both Eyes Given by Other 06/07/14 1600)  homatropine 5 % ophthalmic solution 1 drop (1 drop Left Eye Given 06/07/14 1801)    Patient Vitals for the past 24 hrs:  BP Temp Temp src Pulse Resp SpO2 Height Weight  06/07/14 1746 125/71 mmHg - - 82 17 100 % - -  06/07/14 1644 140/84 mmHg - - 75 17 99 % - -  06/07/14 1532 137/94 mmHg - - 80 17 100 % - -  06/07/14 1450 159/89 mmHg 98.1 F (36.7 C) Oral 84 18 100 % 5' 7.5" (1.715 m) 235 lb (106.595 kg)   After dilatation of the left side, with homatropine- attempt at endoscopy with indirect ophthalmoscope was done. Seems to be an opacity at the level of the lens, brown in color with some dark striations, in a horizontal fashion. I am unable to see the optic disc or any of the retina.  Consultation ophthalmology- case discussed with physician on-call for ophthalmology. She recommends outpatient follow-up with ophthalmology as an outpatient in one or 2 days. She suspects that the patient has vitreous bleeding of unknown source.   At discharge- Reevaluation with update and discussion. After initial assessment and treatment, an updated evaluation reveals patient remains comfortable , findings discussed with patient and mother, all questions answered. Kimberly Patrick L   Labs Review Labs Reviewed - No data to display  Imaging Review No results found.   EKG Interpretation None      MDM   Final diagnoses:  Vision loss, left eye    Subacute vision loss left eye, most likely due to intracranial  bleeding from unknown source. Patient is stable for discharge without further intervention at this time.  Nursing Notes Reviewed/ Care Coordinated Applicable Imaging Reviewed Interpretation of Laboratory Data incorporated into ED treatment  The patient appears reasonably screened and/or stabilized for discharge and I doubt any other medical condition or other [facility] requiring further screening, evaluation, or treatment in the ED at this time prior to discharge.  Plan: Home Medications- none; Home Treatments- rest; return here if the recommended treatment, does not improve the symptoms; Recommended follow up- Ophtho. Tomorrow for check up   Mancel Bale, MD 06/07/14 [phone]

## 2014-06-07 NOTE — Discharge Instructions (Signed)
Visual Disturbances °You have had a disturbance in your vision. This may be caused by various conditions, such as: °· Migraines. Migraine headaches are often preceded by a disturbance in vision. Blind spots or light flashes are followed by a headache. This type of visual disturbance is temporary. It does not damage the eye. °· Glaucoma. This is caused by increased pressure in the eye. Symptoms include haziness, blurred vision, or seeing rainbow colored circles when looking at bright lights. Partial or complete visual loss can occur. You may or may not experience eye pain. Visual loss may be gradual or sudden and is irreversible. Glaucoma is the leading cause of blindness. °· Retina problems. Vision will be reduced if the retina becomes detached or if there is a circulation problem as with diabetes, high blood pressure, or a mini-stroke. Symptoms include seeing "floaters," flashes of light, or shadows, as if a curtain has fallen over your eye. °· Optic nerve problems. The main nerve in your eye can be damaged by redness, soreness, and swelling (inflammation), poor circulation, drugs, and toxins. °It is very important to have a complete exam done by a specialist to determine the exact cause of your eye problem. The specialist may recommend medicines or surgery, depending on the cause of the problem. This can help prevent further loss of vision or reduce the risk of having a stroke. Contact the caregiver to whom you have been referred and arrange for follow-up care right away. °SEEK IMMEDIATE MEDICAL CARE IF:  °· Your vision gets worse. °· You develop severe headaches. °· You have any weakness or numbness in the face, arms, or legs. °· You have any trouble speaking or walking. °Document Released: 03/30/2004 Document Revised: 05/15/2011 Document Reviewed: 07/21/2009 °Exit[employer]® Patient Information ©2015 [employer]. This information is not intended to replace advice given to you by your health care provider. Make sure  you discuss any questions you have with your health care provider. ° °

## 2014-06-10 DIAGNOSIS — T508X5A Adverse effect of diagnostic agents, initial encounter: Secondary | ICD-10-CM | POA: Insufficient documentation

## 2014-06-10 DIAGNOSIS — R8281 Pyuria: Secondary | ICD-10-CM | POA: Insufficient documentation

## 2014-06-10 DIAGNOSIS — H5462 Unqualified visual loss, left eye, normal vision right eye: Secondary | ICD-10-CM | POA: Insufficient documentation

## 2014-06-11 DIAGNOSIS — H469 Unspecified optic neuritis: Secondary | ICD-10-CM | POA: Insufficient documentation

## 2014-12-29 IMAGING — US US ABDOMEN LIMITED
1 series · 14 of 25 positions shown · non-contrast
Comparison: 11/09/2011

CLINICAL DATA: Right upper quadrant abdominal pain,
nausea/vomiting, 5 months pregnant.

LIMITED ABDOMINAL ULTRASOUND - RIGHT UPPER QUADRANT

[Series 1: us abdomen limited · 0.30mm/px · 14 of 62 slices shown]
[im 1/62]
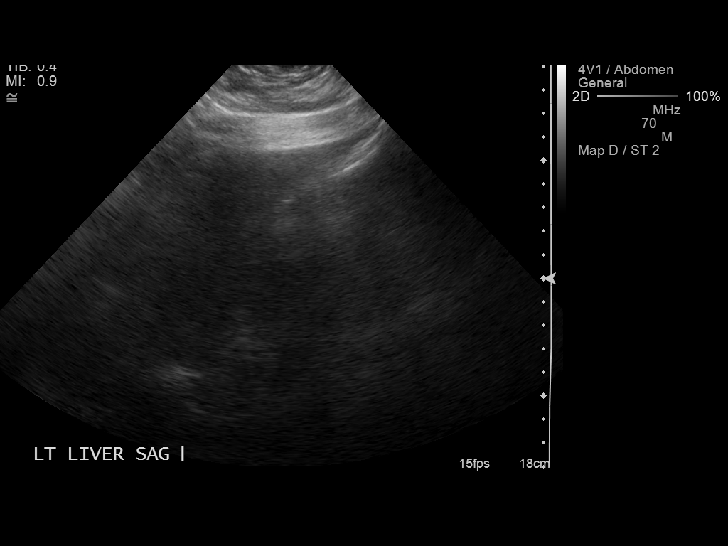
[im 6/62]
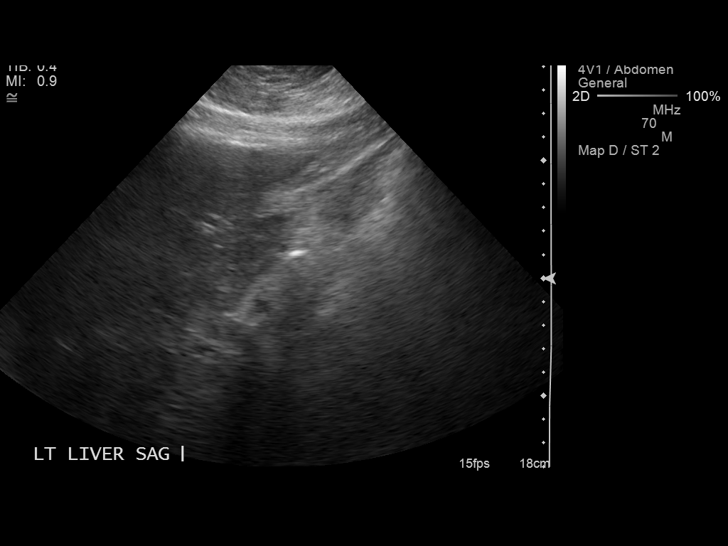
[im 11/62]
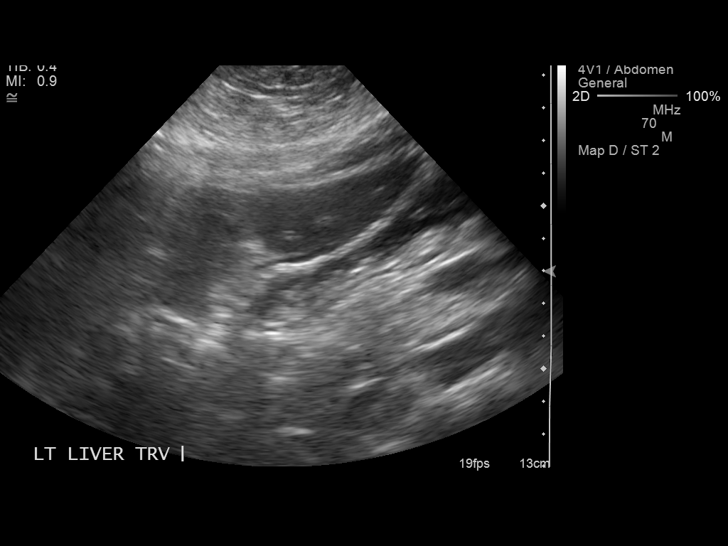
[im 16/62]
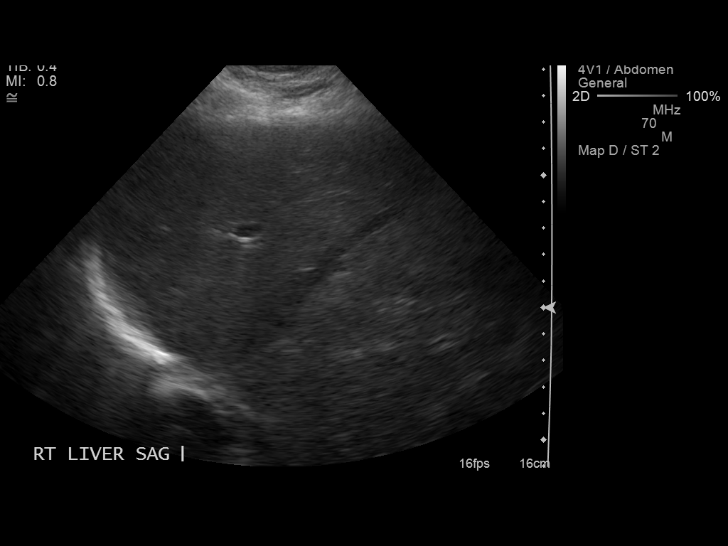
[im 21/62]
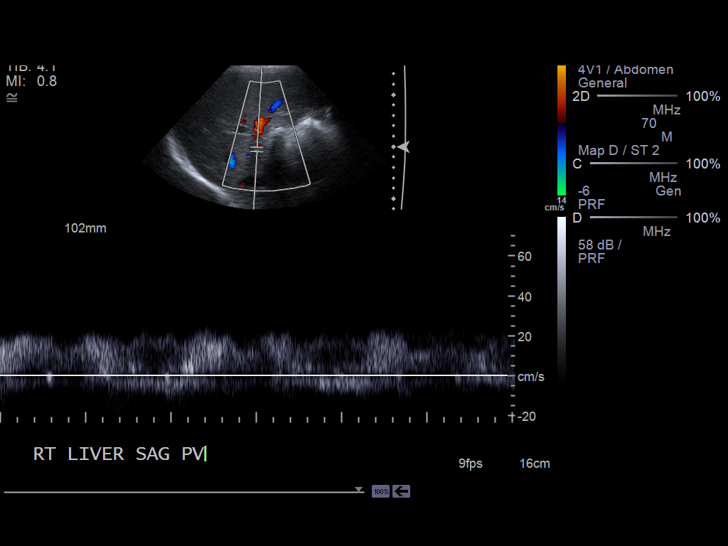
[im 23/62]
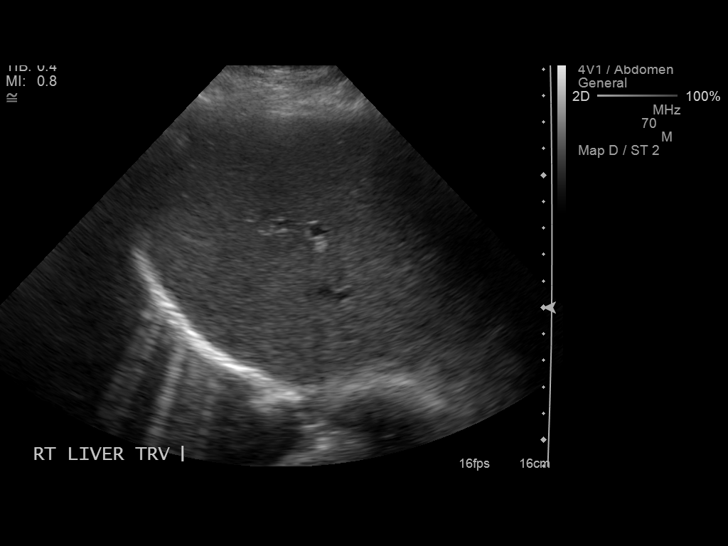
[im 28/62]
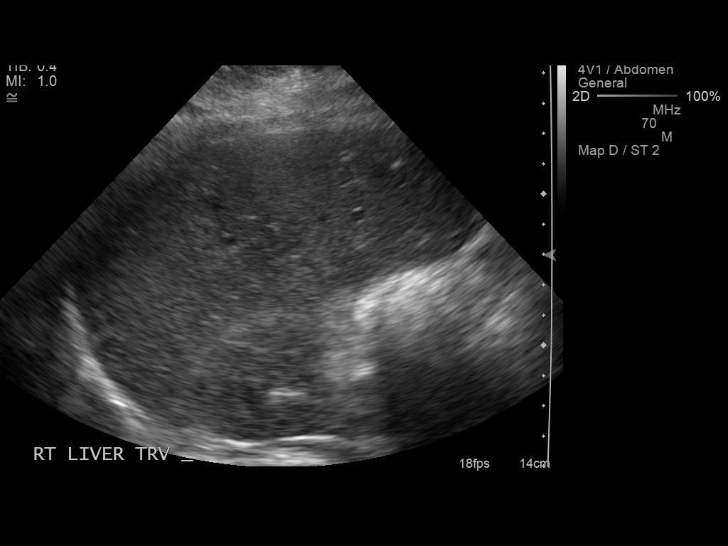
[im 34/62]
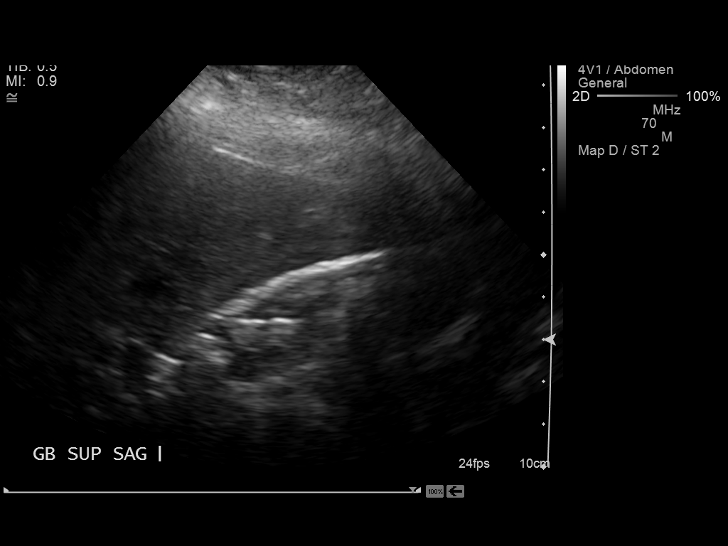
[im 39/62]
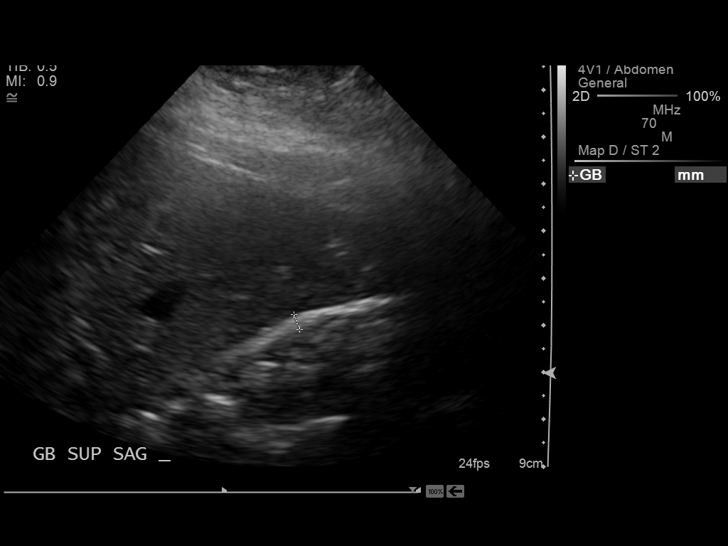
[im 41/62]
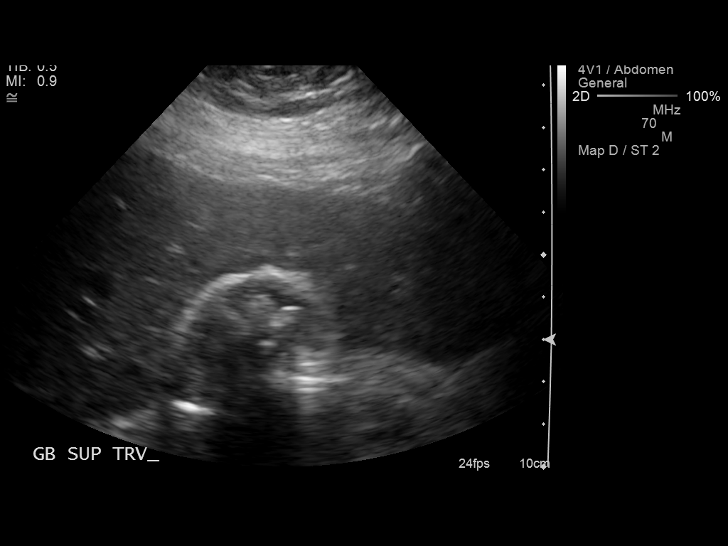
[im 46/62]
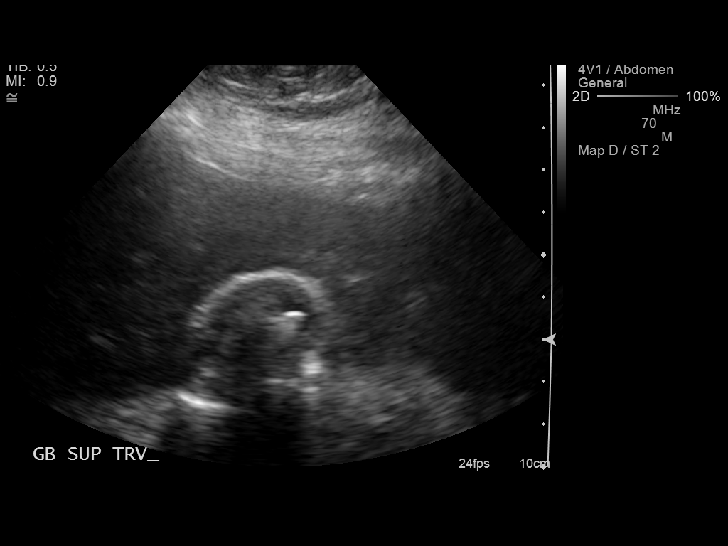
[im 51/62]
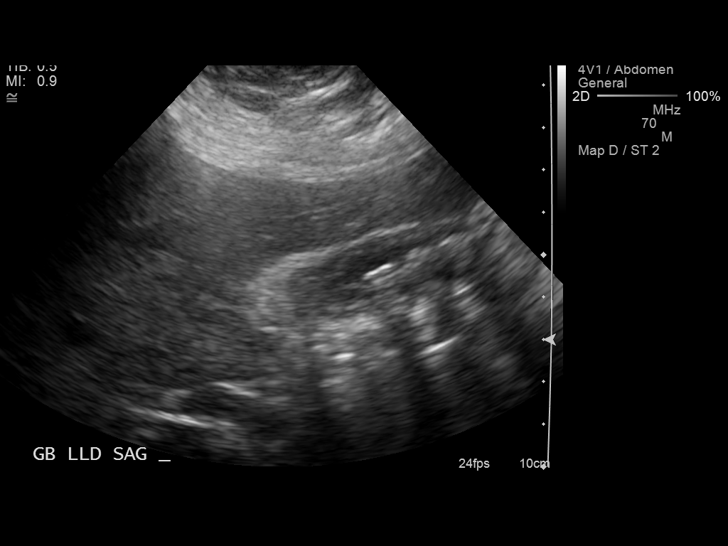
[im 56/62]
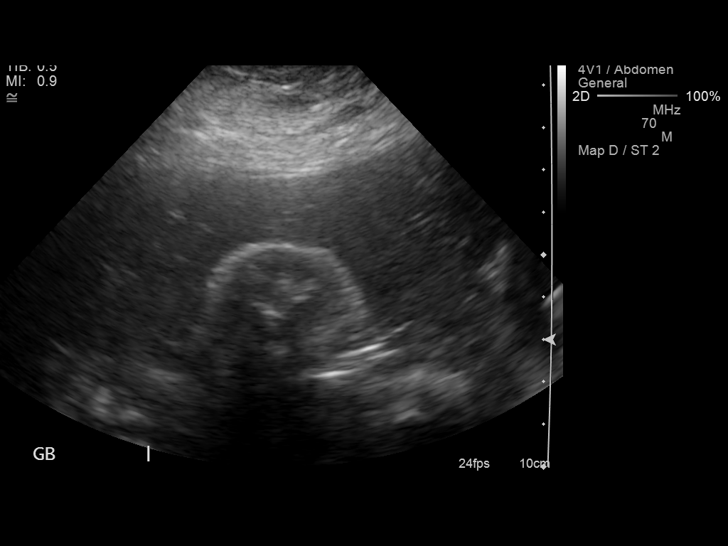
[im 62/62]
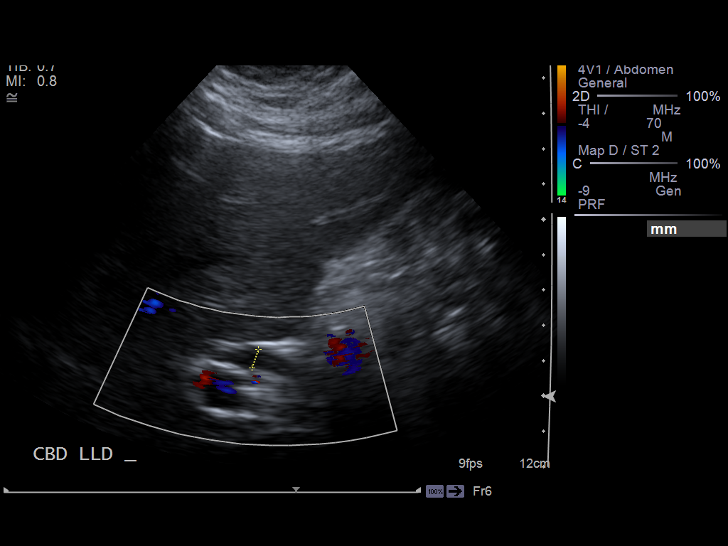

[14 of 25 positions shown; findings below may reference images not displayed]

FINDINGS: Gallbladder:  Contracted gallbladder with numerous gallstones.  No
gallbladder wall thickening or pericholecystic fluid.  Negative
sonographic Murphy's sign.

Common bile duct:  Measures 5.5 mm.

Liver:  Hyperechoic hepatic parenchyma, suggesting hepatic
steatosis.
IMPRESSION: Cholelithiasis, without associated findings to suggest acute
cholecystitis.

Hepatic steatosis.

## 2015-05-05 ENCOUNTER — Encounter (HOSPITAL_COMMUNITY): Payer: Self-pay | Admitting: Emergency Medicine

## 2015-05-05 ENCOUNTER — Emergency Department (HOSPITAL_COMMUNITY)
Admission: EM | Admit: 2015-05-05 | Discharge: 2015-05-05 | Disposition: A | Discharge status: Home / Self Care | Payer: Medicaid Other | Attending: Emergency Medicine | Admitting: Emergency Medicine

## 2015-05-05 DIAGNOSIS — Z531 Procedure and treatment not carried out because of patient's decision for reasons of belief and group pressure: Secondary | ICD-10-CM | POA: Insufficient documentation

## 2015-05-05 DIAGNOSIS — Z5321 Procedure and treatment not carried out due to patient leaving prior to being seen by health care provider: Secondary | ICD-10-CM

## 2015-05-05 DIAGNOSIS — R197 Diarrhea, unspecified: Secondary | ICD-10-CM | POA: Insufficient documentation

## 2015-05-05 DIAGNOSIS — I10 Essential (primary) hypertension: Secondary | ICD-10-CM | POA: Insufficient documentation

## 2015-05-05 DIAGNOSIS — F17210 Nicotine dependence, cigarettes, uncomplicated: Secondary | ICD-10-CM | POA: Insufficient documentation

## 2015-05-05 DIAGNOSIS — R1110 Vomiting, unspecified: Secondary | ICD-10-CM | POA: Insufficient documentation

## 2015-05-05 NOTE — ED Provider Notes (Signed)
After reviewing nursing notes appears the patient left without being seen.  Marily Memos, MD 05/05/15 [phone]

## 2015-05-05 NOTE — ED Notes (Signed)
Called back to room 4 no answer.

## 2015-05-05 NOTE — ED Notes (Signed)
Having vomiting and diarrhea since 2300 last night.  Vomited about 10 times in last 24 hours and same for diarrhea.  Denies any  fever or chills.

## 2015-05-05 NOTE — ED Notes (Signed)
2nd call, no answer.

## 2015-05-06 ENCOUNTER — Emergency Department (HOSPITAL_COMMUNITY)
Admission: EM | Admit: 2015-05-06 | Discharge: 2015-05-06 | Disposition: A | Discharge status: Home / Self Care | Payer: Medicaid Other | Attending: Emergency Medicine | Admitting: Emergency Medicine

## 2015-05-06 ENCOUNTER — Encounter (HOSPITAL_COMMUNITY): Payer: Self-pay | Admitting: *Deleted

## 2015-05-06 DIAGNOSIS — F17210 Nicotine dependence, cigarettes, uncomplicated: Secondary | ICD-10-CM | POA: Insufficient documentation

## 2015-05-06 DIAGNOSIS — I10 Essential (primary) hypertension: Secondary | ICD-10-CM | POA: Insufficient documentation

## 2015-05-06 DIAGNOSIS — F329 Major depressive disorder, single episode, unspecified: Secondary | ICD-10-CM | POA: Insufficient documentation

## 2015-05-06 DIAGNOSIS — Z79899 Other long term (current) drug therapy: Secondary | ICD-10-CM | POA: Insufficient documentation

## 2015-05-06 DIAGNOSIS — R197 Diarrhea, unspecified: Secondary | ICD-10-CM

## 2015-05-06 DIAGNOSIS — R112 Nausea with vomiting, unspecified: Secondary | ICD-10-CM

## 2015-05-06 LAB — URINALYSIS, ROUTINE W REFLEX MICROSCOPIC
GLUCOSE, UA: NEGATIVE mg/dL
Nitrite: NEGATIVE
PROTEIN: 30 mg/dL — AB
Specific Gravity, Urine: 1.025 (ref 1.005–1.030)
pH: 6 (ref 5.0–8.0)

## 2015-05-06 LAB — URINE MICROSCOPIC-ADD ON

## 2015-05-06 MED ORDER — ONDANSETRON 8 MG PO TBDP: 8.0000 mg | ORAL_TABLET | Freq: Three times a day (TID) | ORAL | Status: DC | PRN

## 2015-05-06 NOTE — ED Provider Notes (Signed)
CSN: [id number]     Arrival date & time 05/06/15  1057 History   By signing my name below, I, Kimberly Patrick, attest that this documentation has been prepared under the direction and in the presence of Margarita Grizzle, MD Electronically Signed: Charlean Merl, ED Scribe 05/06/2015 at 11:19 AM.    Chief Complaint  Patient presents with  . Diarrhea   HPI  HPI Comments: Kimberly Patrick is a 30 y.o. female who presents to the Emergency Department complaining of vomiting and diarrhea that began 2 days ago and continued all day yesterday. Pt has not vomited today, but had several episodes throughout the day yesterday. Pt had some diarrhea last night, and one episode this morning. BM are green in color. Pt admits to having some associated chills. Pt is urinating normally, and denies any fever, increased frequency or dysuria. Pt works at a [employer] but has not gone to work since [employer] began.   Past Medical History  Diagnosis Date  . Gallstones and inflammation of gallbladder without obstruction 03/28/2012  . Depression   . Heart palpitations   . Hypertension    History reviewed. No pertinent past surgical history. Family History  Problem Relation Age of Onset  . Coronary artery disease Mother   . Hypertension Mother   . Breast cancer Cousin   . Diabetes Maternal Aunt   . Stroke Maternal Aunt    Social History  Substance Use Topics  . Smoking status: Current Every Day Smoker -- 0.50 packs/day    Types: Cigarettes  . Smokeless tobacco: Never Used  . Alcohol Use: Yes     Comment: occ. use. no alcohol since preg.   OB History    Gravida Para Term Preterm AB TAB SAB Ectopic Multiple Living   Review of Systems  Constitutional: Positive for chills. Negative for fever.  Gastrointestinal: Positive for nausea, vomiting and diarrhea.  Genitourinary: Negative for dysuria and frequency.  All other systems reviewed and are negative.  Allergies   Gadopentetate  Home Medications   Prior to Admission medications   Medication Sig Start Date End Date Taking? Authorizing Provider  Hyprom-Naphaz-Polysorb-Zn Sulf (CLEAR EYES COMPLETE OP) Apply 1 drop to eye as needed (FOR EYE IRRITATION).   Yes Historical Provider, MD  medroxyPROGESTERone (DEPO-PROVERA) 150 MG/ML injection Inject 1 mL (150 mg total) into the muscle every 3 (three) months. 12/20/12  Yes Lazaro Arms, MD  ondansetron (ZOFRAN ODT) 8 MG disintegrating tablet Take 1 tablet (8 mg total) by mouth every 8 (eight) hours as needed for nausea or vomiting. 05/06/15   Margarita Grizzle, MD   BP 127/93 mmHg  Pulse 85  Temp(Src) 98.7 F (37.1 C) (Oral)  Resp 14  Ht  (1.702 m)  Wt 250 lb (113.399 kg)  BMI 39.15 kg/m2  SpO2 99% Physical Exam  Constitutional: She is oriented to person, place, and time. She appears well-developed and well-nourished. No distress.  HENT:  Head: Normocephalic and atraumatic.  Right Ear: External ear normal.  Left Ear: External ear normal.  Nose: Nose normal.  Eyes: Conjunctivae and EOM are normal. Pupils are equal, round, and reactive to light.  Neck: Normal range of motion. Neck supple.  Pulmonary/Chest: Effort normal.  Musculoskeletal: Normal range of motion.  Neurological: She is alert and oriented to person, place, and time. She exhibits normal muscle tone. Coordination normal.  Skin: Skin is warm and dry.  Psychiatric:  She has a normal mood and affect. Her behavior is normal. Thought content normal.  Nursing note and vitals reviewed.   ED Course  Procedures  DIAGNOSTIC STUDIES: Oxygen Saturation is 99% on RA, normal by my interpretation.    COORDINATION OF CARE: 11:19 AM-Discussed treatment plan which includes urinalysis and administration of zofran, with pt at bedside and pt agreed to plan.    Labs Review Labs Reviewed  URINALYSIS, ROUTINE W REFLEX MICROSCOPIC (NOT AT [facility]) - Abnormal; Notable for the following:    Hgb urine  dipstick MODERATE (*)    Bilirubin Urine MODERATE (*)    Ketones, ur TRACE (*)    Protein, ur 30 (*)    Leukocytes, UA TRACE (*)    All other components within normal limits  URINE MICROSCOPIC-ADD ON - Abnormal; Notable for the following:    Squamous Epithelial / LPF TOO NUMEROUS TO COUNT (*)    Bacteria, UA MANY (*)    All other components within normal limits    Imaging Review No results found. I have personally reviewed and evaluated these images and lab results as part of my medical decision-making.   EKG Interpretation None      MDM   Final diagnoses:  Nausea vomiting and diarrhea    I personally performed the services described in this documentation, which was scribed in my presence. The recorded information has been reviewed and considered.   Margarita Grizzle, MD 05/18/15 [phone]

## 2015-05-06 NOTE — Discharge Instructions (Signed)

## 2015-05-06 NOTE — ED Notes (Signed)
Pt states vomiting and diarrhea began 2 days ago. Last vomited yesterday morning. One loose stool this morning. Pt states she did have abdominal pain but the pain went away after BM this morning.

## 2016-03-15 ENCOUNTER — Encounter (HOSPITAL_COMMUNITY): Payer: Self-pay | Admitting: *Deleted

## 2016-03-15 ENCOUNTER — Emergency Department (HOSPITAL_COMMUNITY)
Admission: EM | Admit: 2016-03-15 | Discharge: 2016-03-15 | Disposition: A | Discharge status: Home / Self Care | Payer: Medicaid Other | Attending: Emergency Medicine | Admitting: Emergency Medicine

## 2016-03-15 DIAGNOSIS — R112 Nausea with vomiting, unspecified: Secondary | ICD-10-CM | POA: Insufficient documentation

## 2016-03-15 DIAGNOSIS — R61 Generalized hyperhidrosis: Secondary | ICD-10-CM | POA: Insufficient documentation

## 2016-03-15 DIAGNOSIS — Z79899 Other long term (current) drug therapy: Secondary | ICD-10-CM | POA: Insufficient documentation

## 2016-03-15 DIAGNOSIS — R109 Unspecified abdominal pain: Secondary | ICD-10-CM | POA: Insufficient documentation

## 2016-03-15 DIAGNOSIS — F17210 Nicotine dependence, cigarettes, uncomplicated: Secondary | ICD-10-CM | POA: Insufficient documentation

## 2016-03-15 DIAGNOSIS — Z791 Long term (current) use of non-steroidal anti-inflammatories (NSAID): Secondary | ICD-10-CM | POA: Insufficient documentation

## 2016-03-15 DIAGNOSIS — R197 Diarrhea, unspecified: Secondary | ICD-10-CM | POA: Insufficient documentation

## 2016-03-15 DIAGNOSIS — I10 Essential (primary) hypertension: Secondary | ICD-10-CM | POA: Insufficient documentation

## 2016-03-15 LAB — URINALYSIS, ROUTINE W REFLEX MICROSCOPIC
Bilirubin Urine: NEGATIVE
Glucose, UA: NEGATIVE mg/dL
LEUKOCYTES UA: NEGATIVE
NITRITE: NEGATIVE
SPECIFIC GRAVITY, URINE: 1.025 (ref 1.005–1.030)
pH: 6 (ref 5.0–8.0)

## 2016-03-15 LAB — BASIC METABOLIC PANEL
ANION GAP: 6 (ref 5–15)
BUN: 8 mg/dL (ref 6–20)
CALCIUM: 8.6 mg/dL — AB (ref 8.9–10.3)
CO2: 23 mmol/L (ref 22–32)
Chloride: 107 mmol/L (ref 101–111)
Creatinine, Ser: 0.81 mg/dL (ref 0.44–1.00)
Glucose, Bld: 105 mg/dL — ABNORMAL HIGH (ref 65–99)
Potassium: 3.3 mmol/L — ABNORMAL LOW (ref 3.5–5.1)
SODIUM: 136 mmol/L (ref 135–145)

## 2016-03-15 LAB — URINALYSIS, MICROSCOPIC (REFLEX)

## 2016-03-15 LAB — PREGNANCY, URINE: PREG TEST UR: NEGATIVE

## 2016-03-15 MED ORDER — ONDANSETRON HCL 4 MG/2ML IJ SOLN
4.0000 mg | Freq: Once | INTRAMUSCULAR | Status: AC
  Administered 2016-03-15: 4 mg via INTRAVENOUS
  Filled 2016-03-15: qty 2

## 2016-03-15 MED ORDER — SODIUM CHLORIDE 0.9 % IV BOLUS (SEPSIS)
1000.0000 mL | Freq: Once | INTRAVENOUS | Status: AC
  Administered 2016-03-15: 1000 mL via INTRAVENOUS

## 2016-03-15 MED ORDER — ONDANSETRON 4 MG PO TBDP: 4.0000 mg | ORAL_TABLET | Freq: Three times a day (TID) | ORAL | 0 refills | Status: DC | PRN

## 2016-03-15 NOTE — ED Triage Notes (Signed)
Pt c/o n/v/d/ that started today around 2pm, pt states her hands and feet have been going numb

## 2016-03-15 NOTE — ED Notes (Addendum)
Pt given ginger ale to drink. Pt complaining of chest tightness states it occurred when fingers went numb. Dr Rubin PayorPickering aware

## 2016-03-15 NOTE — ED Provider Notes (Signed)
[facility]-EMERGENCY DEPT Provider Note   CSN: [id number] Arrival date & time: 03/15/16  2033 By signing my name below, I, Levon Hedger, attest that this documentation has been prepared under the direction and in the presence of Benjiman Core, MD . Electronically Signed: Levon Hedger, Scribe. 03/15/2016. 9:15 PM.   History   Chief Complaint Chief Complaint  Patient presents with  . Emesis   HPI Kimberly Patrick is a 31 y.o. female who presents to the Emergency Department complaining of intermittent vomiting onset this afternoon. Vomiting began after pt woke up this afternoon and has continued throughout the day. She notes associated diaphoresis, diarrhea, nausea, abdominal pain and intermittent, moderate bilateral hand pain. Pt works third shift and states while at work last night she was intermittently diaphoretic, but otherwise felt normal. She describes the hand pain as cramping. Pt states she had a similar hand cramping episode one year ago when she was experiencing loss of vision to her left eye. No alleviating or modifying factors noted.  No recent travel or antibiotics use. She denies any hematochezia and has no other complaints at this time.   The history is provided by the patient. No language interpreter was used.    Past Medical History:  Diagnosis Date  . Depression   . Gallstones and inflammation of gallbladder without obstruction 03/28/2012  . Heart palpitations   . Hypertension     Patient Active Problem List   Diagnosis Date Noted  . Supervision of other normal pregnancy 06/20/2012  . Gallstones and inflammation of gallbladder without obstruction     History reviewed. No pertinent surgical history.  OB History    Gravida Para Term Preterm AB Living   2 2 2     2    SAB TAB Ectopic Multiple Live Births           2       Home Medications    Prior to Admission medications   Medication Sig Start Date End Date Taking? Authorizing Provider  acetaminophen  (TYLENOL) 500 MG tablet Take 500 mg by mouth every 6 (six) hours as needed for moderate pain.   Yes Historical Provider, MD  acetaminophen (TYLENOL) 650 MG CR tablet Take 650 mg by mouth every 8 (eight) hours as needed for pain.   Yes Historical Provider, MD  Hyprom-Naphaz-Polysorb-Zn Sulf (CLEAR EYES COMPLETE OP) Apply 1 drop to eye as needed (FOR EYE IRRITATION).   Yes Historical Provider, MD  ibuprofen (ADVIL,MOTRIN) 200 MG tablet Take 200 mg by mouth every 6 (six) hours as needed for moderate pain.   Yes Historical Provider, MD  medroxyPROGESTERone (DEPO-PROVERA) 150 MG/ML injection Inject 1 mL (150 mg total) into the muscle every 3 (three) months. 12/20/12  Yes Lazaro Arms, MD  ondansetron (ZOFRAN-ODT) 4 MG disintegrating tablet Take 1 tablet (4 mg total) by mouth every 8 (eight) hours as needed for nausea or vomiting. 03/15/16   Benjiman Core, MD    Family History Family History  Problem Relation Age of Onset  . Coronary artery disease Mother   . Hypertension Mother   . Breast cancer Cousin   . Diabetes Maternal Aunt   . Stroke Maternal Aunt     Social History Social History  Substance Use Topics  . Smoking status: Current Every Day Smoker    Packs/day: 0.50    Types: Cigarettes  . Smokeless tobacco: Never Used  . Alcohol use Yes     Comment: occ. use. no alcohol since preg.  Allergies   Gadopentetate and Ivp dye [iodinated diagnostic agents]   Review of Systems Review of Systems  Constitutional: Positive for diaphoresis.  Gastrointestinal: Positive for abdominal pain, diarrhea, nausea and vomiting. Negative for blood in stool.  Musculoskeletal: Positive for arthralgias.  All other systems reviewed and are negative.  Physical Exam Updated Vital Signs BP 147/86 (BP Location: Left Arm)   Pulse 80   Temp 98.9 F (37.2 C) (Oral)   Resp 16   Ht 5\' 6"  (1.676 m)   Wt 200 lb (90.7 kg)   SpO2 100%   BMI 32.28 kg/m   Physical Exam  Constitutional: She is  oriented to person, place, and time. She appears well-developed and well-nourished. No distress.  HENT:  Head: Normocephalic and atraumatic.  Eyes: Conjunctivae are normal.  Cardiovascular: Normal rate.   Pulmonary/Chest: Effort normal and breath sounds normal. No respiratory distress. She has no wheezes. She has no rales.  Abdominal: Soft. She exhibits no distension. There is no tenderness.  Neurological: She is alert and oriented to person, place, and time.  Good grip strengths bilaterally. Normal sensation over radian, median and ulnar distributions.  Skin: Skin is warm and dry.  Good cap refill.   Psychiatric: She has a normal mood and affect.  Nursing note and vitals reviewed.  ED Treatments / Results  DIAGNOSTIC STUDIES:  Oxygen Saturation is 100% on RA, normal by my interpretation.    COORDINATION OF CARE:  9:09 PM Discussed treatment plan with pt at bedside and pt agreed to plan.  Labs (all labs ordered are listed, but only abnormal results are displayed) Labs Reviewed  URINALYSIS, ROUTINE W REFLEX MICROSCOPIC - Abnormal; Notable for the following:       Result Value   Hgb urine dipstick LARGE (*)    Ketones, ur TRACE (*)    Protein, ur TRACE (*)    All other components within normal limits  BASIC METABOLIC PANEL - Abnormal; Notable for the following:    Potassium 3.3 (*)    Glucose, Bld 105 (*)    Calcium 8.6 (*)    All other components within normal limits  URINALYSIS, MICROSCOPIC (REFLEX) - Abnormal; Notable for the following:    Bacteria, UA MANY (*)    Squamous Epithelial / LPF 6-30 (*)    All other components within normal limits  PREGNANCY, URINE    EKG  EKG Interpretation None       Radiology No results found.  Procedures Procedures (including critical care time)  Medications Ordered in ED Medications  sodium chloride 0.9 % bolus 1,000 mL (0 mLs Intravenous Stopped 03/15/16 2250)  ondansetron (ZOFRAN) injection 4 mg (4 mg Intravenous Given  03/15/16 2129)     Initial Impression / Assessment and Plan / ED Course  I have reviewed the triage vital signs and the nursing notes.  Pertinent labs & imaging results that were available during my care of the patient were reviewed by me and considered in my medical decision making (see chart for details).  Clinical Course     Patient with nausea vomiting diarrhea. Also had some cramping in her hands. Felt a little numb of the time. Lab work overall reassuring. Has some mild hypokalemia. Feels better after IV fluids. Has tolerated orals will be discharged home.  Final Clinical Impressions(s) / ED Diagnoses   Final diagnoses:  Nausea vomiting and diarrhea    New Prescriptions New Prescriptions   ONDANSETRON (ZOFRAN-ODT) 4 MG DISINTEGRATING TABLET    Take 1 tablet (  4 mg total) by mouth every 8 (eight) hours as needed for nausea or vomiting.  I personally performed the services described in this documentation, which was scribed in my presence. The recorded information has been reviewed and is accurate.      Benjiman Core, MD 03/15/16 [phone]

## 2020-08-10 ENCOUNTER — Encounter (HOSPITAL_COMMUNITY): Payer: Self-pay | Admitting: Emergency Medicine

## 2020-08-10 ENCOUNTER — Other Ambulatory Visit: Payer: Self-pay

## 2020-08-10 ENCOUNTER — Emergency Department (HOSPITAL_COMMUNITY): Payer: Medicaid Other

## 2020-08-10 ENCOUNTER — Inpatient Hospital Stay (HOSPITAL_COMMUNITY)
Admission: EM | Admit: 2020-08-10 | Discharge: 2020-08-12 | DRG: 640 | Disposition: A | Discharge status: Home / Self Care | Payer: Medicaid Other | Attending: Family Medicine | Admitting: Family Medicine

## 2020-08-10 DIAGNOSIS — I959 Hypotension, unspecified: Secondary | ICD-10-CM | POA: Diagnosis present

## 2020-08-10 DIAGNOSIS — E871 Hypo-osmolality and hyponatremia: Secondary | ICD-10-CM | POA: Diagnosis present

## 2020-08-10 DIAGNOSIS — E860 Dehydration: Secondary | ICD-10-CM | POA: Diagnosis present

## 2020-08-10 DIAGNOSIS — K859 Acute pancreatitis without necrosis or infection, unspecified: Secondary | ICD-10-CM | POA: Diagnosis present

## 2020-08-10 DIAGNOSIS — E876 Hypokalemia: Secondary | ICD-10-CM | POA: Diagnosis not present

## 2020-08-10 DIAGNOSIS — N179 Acute kidney failure, unspecified: Secondary | ICD-10-CM | POA: Diagnosis present

## 2020-08-10 DIAGNOSIS — Z20822 Contact with and (suspected) exposure to covid-19: Secondary | ICD-10-CM | POA: Diagnosis present

## 2020-08-10 DIAGNOSIS — Z8249 Family history of ischemic heart disease and other diseases of the circulatory system: Secondary | ICD-10-CM

## 2020-08-10 DIAGNOSIS — F17210 Nicotine dependence, cigarettes, uncomplicated: Secondary | ICD-10-CM | POA: Diagnosis present

## 2020-08-10 DIAGNOSIS — F17200 Nicotine dependence, unspecified, uncomplicated: Secondary | ICD-10-CM | POA: Diagnosis present

## 2020-08-10 DIAGNOSIS — F32A Depression, unspecified: Secondary | ICD-10-CM | POA: Diagnosis present

## 2020-08-10 DIAGNOSIS — Z91041 Radiographic dye allergy status: Secondary | ICD-10-CM | POA: Diagnosis not present

## 2020-08-10 DIAGNOSIS — Z888 Allergy status to other drugs, medicaments and biological substances status: Secondary | ICD-10-CM | POA: Diagnosis not present

## 2020-08-10 DIAGNOSIS — E878 Other disorders of electrolyte and fluid balance, not elsewhere classified: Secondary | ICD-10-CM | POA: Diagnosis present

## 2020-08-10 DIAGNOSIS — I10 Essential (primary) hypertension: Secondary | ICD-10-CM | POA: Diagnosis present

## 2020-08-10 DIAGNOSIS — E669 Obesity, unspecified: Secondary | ICD-10-CM | POA: Diagnosis present

## 2020-08-10 DIAGNOSIS — T502X5A Adverse effect of carbonic-anhydrase inhibitors, benzothiadiazides and other diuretics, initial encounter: Secondary | ICD-10-CM | POA: Diagnosis present

## 2020-08-10 DIAGNOSIS — Z79899 Other long term (current) drug therapy: Secondary | ICD-10-CM

## 2020-08-10 DIAGNOSIS — Z6835 Body mass index (BMI) 35.0-35.9, adult: Secondary | ICD-10-CM

## 2020-08-10 LAB — CBC WITH DIFFERENTIAL/PLATELET
Abs Immature Granulocytes: 0.45 10*3/uL — ABNORMAL HIGH (ref 0.00–0.07)
Basophils Absolute: 0.1 10*3/uL (ref 0.0–0.1)
Basophils Relative: 1 %
Eosinophils Absolute: 0.3 10*3/uL (ref 0.0–0.5)
Eosinophils Relative: 2 %
HCT: 33.9 % — ABNORMAL LOW (ref 36.0–46.0)
Hemoglobin: 11.6 g/dL — ABNORMAL LOW (ref 12.0–15.0)
Immature Granulocytes: 3 %
Lymphocytes Relative: 7 %
Lymphs Abs: 1.1 10*3/uL (ref 0.7–4.0)
MCH: 31.6 pg (ref 26.0–34.0)
MCHC: 34.2 g/dL (ref 30.0–36.0)
MCV: 92.4 fL (ref 80.0–100.0)
Monocytes Absolute: 0.9 10*3/uL (ref 0.1–1.0)
Monocytes Relative: 6 %
Neutro Abs: 12.1 10*3/uL — ABNORMAL HIGH (ref 1.7–7.7)
Neutrophils Relative %: 81 %
Platelets: 458 10*3/uL — ABNORMAL HIGH (ref 150–400)
RBC: 3.67 MIL/uL — ABNORMAL LOW (ref 3.87–5.11)
RDW: 13.2 % (ref 11.5–15.5)
WBC: 14.9 10*3/uL — ABNORMAL HIGH (ref 4.0–10.5)
nRBC: 0 % (ref 0.0–0.2)

## 2020-08-10 LAB — URINALYSIS, ROUTINE W REFLEX MICROSCOPIC
Bilirubin Urine: NEGATIVE
Glucose, UA: NEGATIVE mg/dL
Ketones, ur: NEGATIVE mg/dL
Leukocytes,Ua: NEGATIVE
Nitrite: NEGATIVE
Protein, ur: 30 mg/dL — AB
Specific Gravity, Urine: 1.014 (ref 1.005–1.030)
pH: 6 (ref 5.0–8.0)

## 2020-08-10 LAB — COMPREHENSIVE METABOLIC PANEL
ALT: 18 U/L (ref 0–44)
AST: 27 U/L (ref 15–41)
Albumin: 2.5 g/dL — ABNORMAL LOW (ref 3.5–5.0)
Alkaline Phosphatase: 87 U/L (ref 38–126)
Anion gap: 15 (ref 5–15)
BUN: 12 mg/dL (ref 6–20)
CO2: 31 mmol/L (ref 22–32)
Calcium: 7.1 mg/dL — ABNORMAL LOW (ref 8.9–10.3)
Chloride: 86 mmol/L — ABNORMAL LOW (ref 98–111)
Creatinine, Ser: 1.23 mg/dL — ABNORMAL HIGH (ref 0.44–1.00)
GFR, Estimated: 59 mL/min — ABNORMAL LOW (ref >60)
Glucose, Bld: 106 mg/dL — ABNORMAL HIGH (ref 70–99)
Potassium: <2 mmol/L — CL (ref 3.5–5.1)
Sodium: 132 mmol/L — ABNORMAL LOW (ref 135–145)
Total Bilirubin: 0.5 mg/dL (ref 0.3–1.2)
Total Protein: 7.8 g/dL (ref 6.5–8.1)

## 2020-08-10 LAB — RESP PANEL BY RT-PCR (FLU A&B, COVID) ARPGX2
Influenza A by PCR: NEGATIVE
Influenza B by PCR: NEGATIVE
SARS Coronavirus 2 by RT PCR: NEGATIVE

## 2020-08-10 LAB — LIPASE, BLOOD: Lipase: 109 U/L — ABNORMAL HIGH (ref 11–51)

## 2020-08-10 LAB — MAGNESIUM: Magnesium: 1.2 mg/dL — ABNORMAL LOW (ref 1.7–2.4)

## 2020-08-10 LAB — PREGNANCY, URINE: Preg Test, Ur: NEGATIVE

## 2020-08-10 MED ORDER — PANTOPRAZOLE SODIUM 40 MG PO TBEC
40.0000 mg | DELAYED_RELEASE_TABLET | Freq: Every day | ORAL | Status: DC
  Administered 2020-08-10 – 2020-08-12 (×3): 40 mg via ORAL
  Filled 2020-08-10 (×3): qty 1

## 2020-08-10 MED ORDER — POTASSIUM CHLORIDE IN NACL 40-0.9 MEQ/L-% IV SOLN
INTRAVENOUS | Status: DC
  Filled 2020-08-10 (×3): qty 1000

## 2020-08-10 MED ORDER — ACETAMINOPHEN 325 MG PO TABS: 650.0000 mg | ORAL_TABLET | Freq: Four times a day (QID) | ORAL | Status: DC | PRN

## 2020-08-10 MED ORDER — ENOXAPARIN SODIUM 40 MG/0.4ML IJ SOSY
40.0000 mg | PREFILLED_SYRINGE | INTRAMUSCULAR | Status: DC
  Administered 2020-08-10 – 2020-08-11 (×2): 40 mg via SUBCUTANEOUS
  Filled 2020-08-10 (×2): qty 0.4

## 2020-08-10 MED ORDER — POTASSIUM CHLORIDE CRYS ER 20 MEQ PO TBCR
40.0000 meq | EXTENDED_RELEASE_TABLET | Freq: Once | ORAL | Status: AC
Start: 2020-08-10 — End: 2020-08-10
  Administered 2020-08-10: 40 meq via ORAL
  Filled 2020-08-10: qty 2

## 2020-08-10 MED ORDER — ONDANSETRON HCL 4 MG PO TABS: 4.0000 mg | ORAL_TABLET | Freq: Four times a day (QID) | ORAL | Status: DC | PRN

## 2020-08-10 MED ORDER — POTASSIUM CHLORIDE CRYS ER 20 MEQ PO TBCR
40.0000 meq | EXTENDED_RELEASE_TABLET | ORAL | Status: AC
  Administered 2020-08-10 – 2020-08-11 (×3): 40 meq via ORAL
  Filled 2020-08-10 (×3): qty 2

## 2020-08-10 MED ORDER — POTASSIUM CHLORIDE 10 MEQ/100ML IV SOLN
INTRAVENOUS | Status: AC
  Administered 2020-08-10: 10 meq via INTRAVENOUS
  Filled 2020-08-10: qty 100

## 2020-08-10 MED ORDER — ONDANSETRON HCL 4 MG/2ML IJ SOLN: 4.0000 mg | Freq: Four times a day (QID) | INTRAMUSCULAR | Status: DC | PRN

## 2020-08-10 MED ORDER — ACETAMINOPHEN 650 MG RE SUPP: 650.0000 mg | Freq: Four times a day (QID) | RECTAL | Status: DC | PRN

## 2020-08-10 MED ORDER — SODIUM CHLORIDE 0.9 % IV BOLUS
1000.0000 mL | Freq: Once | INTRAVENOUS | Status: AC
  Administered 2020-08-10: 1000 mL via INTRAVENOUS

## 2020-08-10 MED ORDER — POTASSIUM CHLORIDE 10 MEQ/100ML IV SOLN
10.0000 meq | INTRAVENOUS | Status: DC
  Administered 2020-08-10: 10 meq via INTRAVENOUS
  Filled 2020-08-10: qty 100

## 2020-08-10 MED ORDER — MAGNESIUM SULFATE 4 GM/100ML IV SOLN
4.0000 g | Freq: Once | INTRAVENOUS | Status: AC
  Administered 2020-08-10: 4 g via INTRAVENOUS
  Filled 2020-08-10: qty 100

## 2020-08-10 MED ORDER — VITAMIN D (ERGOCALCIFEROL) 1.25 MG (50000 UNIT) PO CAPS: 50000.0000 [IU] | ORAL_CAPSULE | ORAL | Status: DC

## 2020-08-10 NOTE — ED Provider Notes (Signed)
[facility] EMERGENCY DEPARTMENT Provider Note   CSN: [id number] Arrival date & time: 08/10/20  1143     History Chief Complaint  Patient presents with  . Abnormal Lab    Kimberly Patrick is a 35 y.o. female.  The history is provided by the patient. No language interpreter was used.  Abnormal Lab Time since result:  Yesterday Patient referred by:  PCP Resulting agency:  External Resulting agency details:  [facility] Result type: chemistry    Pt sent here by her MD because her potassium was less than 2.  Pt reports she has had some left back pain and left sided abdominal pain.  Pt also had an elevated amalase.  Pt has had a history of gallbladder disease.  Pt reports no right upper abdominal pain    Pt's primary stopped atenolol-chlorthalidone yesterday because pt's blood pressure was low. Past Medical History:  Diagnosis Date  . Depression   . Gallstones and inflammation of gallbladder without obstruction 03/28/2012  . Heart palpitations   . Hypertension     Patient Active Problem List   Diagnosis Date Noted  . Supervision of other normal pregnancy 06/20/2012  . Gallstones and inflammation of gallbladder without obstruction     History reviewed. No pertinent surgical history.   OB History    Gravida  2   Para  2   Term  2   Preterm      AB      Living  2     SAB      IAB      Ectopic      Multiple      Live Births  2           Family History  Problem Relation Age of Onset  . Coronary artery disease Mother   . Hypertension Mother   . Breast cancer Cousin   . Diabetes Maternal Aunt   . Stroke Maternal Aunt     Social History   Tobacco Use  . Smoking status: Current Every Day Smoker    Packs/day: 0.50    Types: Cigarettes  . Smokeless tobacco: Never Used  Vaping Use  . Vaping Use: Never used  Substance Use Topics  . Alcohol use: Yes    Comment: occ. use. no alcohol since preg.  . Drug use: Yes    Types: Marijuana    Comment:  occasionally    Home Medications Prior to Admission medications   Medication Sig Start Date End Date Taking? Authorizing Provider  atenolol-chlorthalidone (TENORETIC) 50-25 MG tablet Take 1 tablet by mouth daily. 04/06/20  Yes [provider]  ibuprofen (ADVIL,MOTRIN) 200 MG tablet Take 200 mg by mouth every 6 (six) hours as needed for moderate pain.   Yes [provider]  nystatin (MYCOSTATIN) 100000 UNIT/ML suspension Take 5 mLs by mouth 4 (four) times daily. 08/09/20  Yes [provider]  Vitamin D, Ergocalciferol, (DRISDOL) 1.25 MG (50000 UNIT) CAPS capsule Take 50,000 Units by mouth every 7 (seven) days. 05/06/20  Yes [provider]  esomeprazole (NEXIUM) 40 MG capsule Take 40 mg by mouth daily. 08/09/20   [provider]  medroxyPROGESTERone (DEPO-PROVERA) 150 MG/ML injection Inject 1 mL (150 mg total) into the muscle every 3 (three) months. Patient not taking: Reported on 08/10/2020 12/20/12   Lazaro Arms, MD  ondansetron (ZOFRAN-ODT) 4 MG disintegrating tablet Take 1 tablet (4 mg total) by mouth every 8 (eight) hours as needed for nausea or vomiting. Patient not  taking: Reported on 08/10/2020 03/15/16   Benjiman Core, MD    Allergies    Gadopentetate and Ivp dye [iodinated diagnostic agents]  Review of Systems   Review of Systems  All other systems reviewed and are negative.   Physical Exam Updated Vital Signs BP 101/66   Pulse 96   Temp 98.9 F (37.2 C) (Oral)   Resp 18   Ht 5\' 6"  (1.676 m)   Wt 100.2 kg   SpO2 100%   BMI 35.67 kg/m   Physical Exam Vitals and nursing note reviewed.  Constitutional:      Appearance: She is well-developed.  HENT:     Head: Normocephalic.  Cardiovascular:     Rate and Rhythm: Normal rate.  Pulmonary:     Effort: Pulmonary effort is normal.  Abdominal:     General: There is no distension.  Musculoskeletal:        General: Normal range of motion.     Cervical back: Normal range of motion.   Skin:    General: Skin is warm.  Neurological:     General: No focal deficit present.     Mental Status: She is alert and oriented to person, place, and time.  Psychiatric:        Mood and Affect: Mood normal.     ED Results / Procedures / Treatments   Labs (all labs ordered are listed, but only abnormal results are displayed) Labs Reviewed  CBC WITH DIFFERENTIAL/PLATELET - Abnormal; Notable for the following components:      Result Value   WBC 14.9 (*)    RBC 3.67 (*)    Hemoglobin 11.6 (*)    HCT 33.9 (*)    Platelets 458 (*)    Neutro Abs 12.1 (*)    Abs Immature Granulocytes 0.45 (*)    All other components within normal limits  COMPREHENSIVE METABOLIC PANEL - Abnormal; Notable for the following components:   Sodium 132 (*)    Potassium <2.0 (*)    Chloride 86 (*)    Glucose, Bld 106 (*)    Creatinine, Ser 1.23 (*)    Calcium 7.1 (*)    Albumin 2.5 (*)    GFR, Estimated 59 (*)    All other components within normal limits  LIPASE, BLOOD - Abnormal; Notable for the following components:   Lipase 109 (*)    All other components within normal limits  URINALYSIS, ROUTINE W REFLEX MICROSCOPIC - Abnormal; Notable for the following components:   APPearance HAZY (*)    Hgb urine dipstick MODERATE (*)    Protein, ur 30 (*)    Bacteria, UA RARE (*)    All other components within normal limits  MAGNESIUM - Abnormal; Notable for the following components:   Magnesium 1.2 (*)    All other components within normal limits  RESP PANEL BY RT-PCR (FLU A&B, COVID) ARPGX2  PREGNANCY, URINE    EKG None  Radiology Abdomen Complete  Result Date: 08/10/2020 CLINICAL DATA:  Abdominal pain and back pain for 2 days, elevated LFTs EXAM: ABDOMEN ULTRASOUND COMPLETE COMPARISON:  07/28/2012 Correlation: CT abdomen and pelvis 11/08/2011 FINDINGS: Gallbladder: Contracted. Wall thickness inadequately assessed due to degree of contraction. Question minimal sludge within gallbladder. No  shadowing calculi or wall thickening evident. No pericholecystic fluid or sonographic Murphy sign. Common bile duct: Diameter: 6 mm. Contains scattered internal echoes question debris. No shadowing calcifications to suggest choledocholithiasis. Liver: Upper normal echogenicity. No mass or nodularity. No definite intrahepatic biliary  dilatation. Portal vein is patent on color Doppler imaging with normal direction of blood flow towards the liver. IVC: Normal appearance Pancreas: Distal tail obscured by bowel gas. Visualized portion normal appearance. Spleen: Normal appearance, 5.9 cm length Right Kidney: Length: 13.3 cm. Normal morphology without mass or hydronephrosis. Left Kidney: Length: 10.3 cm. Normal morphology without mass or hydronephrosis. Abdominal aorta: Mid to distal portions obscured by bowel gas. Visualized portion normal caliber Other findings: No free fluid IMPRESSION: Contracted gallbladder, limiting evaluation, though appearing to contain a small amount of sludge; gallbladder calculi seen on prior ultrasound not identified on current limited gallbladder assessment. Common bile duct measures 6 mm diameter, containing scattered internal echoes question debris but no shadowing calculi. Correlation with LFTs recommended. If there is clinical concern for choledocholithiasis, recommend MRCP. Incomplete pancreatic and aortic visualization. Electronically Signed   By: Ulyses Southward M.D.   On: 08/10/2020 15:44    Procedures Procedures   Medications Ordered in ED Medications  potassium chloride SA (KLOR-CON) CR tablet 40 mEq (has no administration in time range)  potassium chloride 10 mEq in 100 mL IVPB (has no administration in time range)  sodium chloride 0.9 % bolus 1,000 mL (has no administration in time range)    ED Course  I have reviewed the triage vital signs and the nursing notes.  Pertinent labs & imaging results that were available during my care of the patient were reviewed by me and  considered in my medical decision making (see chart for details).    MDM Rules/Calculators/A&P                          MDM:  K is less than 2.  Pt has elevated lipase, decreased magnesium and elevated wbc count.  Ultrasound shows no acute gallbladder  Final Clinical Impression(s) / ED Diagnoses Final diagnoses:  Hypokalemia  Acute pancreatitis, unspecified complication status, unspecified pancreatitis type    Rx / DC Orders ED Discharge Orders    None       Osie Cheeks 08/10/20 1722    Cathren Laine, MD 08/13/20 1524

## 2020-08-10 NOTE — H&P (Signed)
History and Physical    Kimberly Cadetatrice T Dehn ZOX:[id number] DOB: 02/12/1986 DOA: 08/10/2020  [facility]   Patient coming from: Home  I have personally briefly reviewed patient's old medical [facility]  Chief Complaint: Back pain HPI: Kimberly Patrick is a 35 y.o. female with medical history significant for hypertension, nicotine dependence, gallbladder disease who was referred to the emergency room by her primary care provider for treatment of hypokalemia. Patient was seen at her primary care provider's office 1 day prior to her admission and was said to be hypotensive.  She had lab work drawn and her primary care provider advised her to discontinue her blood pressure medication, patient was on (atenolol/chlorthalidone). She was called on the day of admission and advised to go to the ER because her potassium level was less than 2. She complains of pain in her left upper quadrant and back but denies having any nausea, no vomiting, no changes in her bowel habits, no fever, no chills, no urinary symptoms, no dizziness, no lightheadedness, no palpitations, no diaphoresis, no focal deficits, no blurred vision. Labs show sodium 132, potassium less than 2, chloride 86, bicarb 31, glucose 106, BUN 12, creatinine 1.23 over a baseline of 0.81, calcium 7.1, anion gap 15, magnesium 1.2, alkaline phosphatase 87, albumin 2.5, lipase 109, AST 27, ALT 18, total protein 7.8, white count 14.9, hemoglobin 11.6, hematocrit 33.9, MCV 92.4, RDW 13.2, platelet count 458 Urine pregnancy test is negative Urinalysis is sterile Abdominal ultrasound shows contracted gallbladder, limiting evaluation, though appearing to contain a small amount of sludge; gallbladder calculi seen on prior ultrasound not identified on current limited gallbladder assessment. Common bile duct measures 6 mm diameter, containing scattered internal echoes question debris but no shadowing  calculi. Correlation with LFTs recommended.    ED Course: Patient is a 35 year old African-American female who was sent to the emergency room by her primary care provider for evaluation of electrolyte abnormalities which showed hypokalemia as well as hypomagnesemia. Patient had been on atenolol/chlorthalidone for hypertension and this was recently discontinued because her blood pressure was running low. She will be admitted to the hospital for further evaluation.  Review of Systems: As per HPI otherwise all other systems reviewed and negative.    Past Medical History:  Diagnosis Date  . Depression   . Gallstones and inflammation of gallbladder without obstruction 03/28/2012  . Heart palpitations   . Hypertension     History reviewed. No pertinent surgical history.   reports that she has been smoking cigarettes. She has been smoking about 0.50 packs per day. She has never used smokeless tobacco. She reports current alcohol use. She reports current drug use. Drug: Marijuana.  Allergies  Allergen Reactions  . Gadopentetate Hives, Other (See Comments) and Swelling    Sneezing, coughing, hives, lip swelling  . Ivp Dye [Iodinated Diagnostic Agents]     Family History  Problem Relation Age of Onset  . Coronary artery disease Mother   . Hypertension Mother   . Breast cancer Cousin   . Diabetes Maternal Aunt   . Stroke Maternal Aunt       Prior to Admission medications   Medication Sig Start Date End Date Taking? Authorizing Provider  atenolol-chlorthalidone (TENORETIC) 50-25 MG tablet Take 1 tablet by mouth daily. 04/06/20  Yes [provider]  ibuprofen (ADVIL,MOTRIN) 200 MG tablet Take 200 mg by mouth every 6 (six) hours as needed for moderate pain.   Yes [provider]  nystatin (MYCOSTATIN) 100000 UNIT/ML suspension Take 5 mLs by mouth 4 (four) times daily. 08/09/20  Yes [provider]  Vitamin D, Ergocalciferol, (DRISDOL) 1.25 MG (50000 UNIT) CAPS  capsule Take 50,000 Units by mouth every 7 (seven) days. 05/06/20  Yes [provider]  esomeprazole (NEXIUM) 40 MG capsule Take 40 mg by mouth daily. 08/09/20   [provider]  medroxyPROGESTERone (DEPO-PROVERA) 150 MG/ML injection Inject 1 mL (150 mg total) into the muscle every 3 (three) months. Patient not taking: Reported on 08/10/2020 12/20/12   Lazaro Arms, MD  ondansetron (ZOFRAN-ODT) 4 MG disintegrating tablet Take 1 tablet (4 mg total) by mouth every 8 (eight) hours as needed for nausea or vomiting. Patient not taking: Reported on 08/10/2020 03/15/16   Benjiman Core, MD    Physical Exam: Vitals:   08/10/20 1320 08/10/20 1531 08/10/20 1706 08/10/20 1800  BP:  101/66 116/68 108/65  Pulse:  96 76 94  Resp: 18 18 18 18   Temp:  98.9 F (37.2 C) 98.7 F (37.1 C) 99.4 F (37.4 C)  TempSrc:  Oral Oral Oral  SpO2:  100% 100% 100%  Weight:      Height:         Vitals:   08/10/20 1320 08/10/20 1531 08/10/20 1706 08/10/20 1800  BP:  101/66 116/68 108/65  Pulse:  96 76 94  Resp: 18 18 18 18   Temp:  98.9 F (37.2 C) 98.7 F (37.1 C) 99.4 F (37.4 C)  TempSrc:  Oral Oral Oral  SpO2:  100% 100% 100%  Weight:      Height:          Constitutional: Alert and oriented x 3 . Not in any apparent distress HEENT:      Head: Normocephalic and atraumatic.         Eyes: PERLA, EOMI, Conjunctivae are normal. Sclera is non-icteric.       Mouth/Throat: Mucous membranes are moist.       Neck: Supple with no signs of meningismus. Cardiovascular: Regular rate and rhythm. No murmurs, gallops, or rubs. 2+ symmetrical distal pulses are present . No JVD. No LE edema Respiratory: Respiratory effort normal .Lungs sounds clear bilaterally. No wheezes, crackles, or rhonchi.  Gastrointestinal: Soft, non tender, and non distended with positive bowel sounds.  Genitourinary: No CVA tenderness. Musculoskeletal: Nontender with normal range of motion in all extremities. No cyanosis, or  erythema of extremities. Neurologic:  Face is symmetric. Moving all extremities. No gross focal neurologic deficits . Skin: Skin is warm, dry.  No rash or ulcers Psychiatric: Mood and affect are normal   Labs on Admission: I have personally reviewed following labs and imaging studies  CBC: Recent Labs  Lab 08/10/20 1435  WBC 14.9*  NEUTROABS 12.1*  HGB 11.6*  HCT 33.9*  MCV 92.4  PLT 458*   Basic Metabolic Panel: Recent Labs  Lab 08/10/20 1435  NA 132*  K <2.0*  CL 86*  CO2 31  GLUCOSE 106*  BUN 12  CREATININE 1.23*  CALCIUM 7.1*  MG 1.2*   GFR: Estimated Creatinine Clearance: 76.3 mL/min (A) (by C-G formula based on SCr of 1.23 mg/dL (H)). Liver Function Tests: Recent Labs  Lab 08/10/20 1435  AST 27  ALT 18  ALKPHOS 87  BILITOT 0.5  PROT 7.8  ALBUMIN 2.5*   Recent Labs  Lab 08/10/20 1435  LIPASE 109*   No results for input(s): AMMONIA in the last 168 hours. Coagulation Profile: No results for  input(s): INR, PROTIME in the last 168 hours. Cardiac Enzymes: No results for input(s): CKTOTAL, CKMB, CKMBINDEX, TROPONINI in the last 168 hours. BNP (last 3 results) No results for input(s): PROBNP in the last 8760 hours. HbA1C: No results for input(s): HGBA1C in the last 72 hours. CBG: No results for input(s): GLUCAP in the last 168 hours. Lipid Profile: No results for input(s): CHOL, HDL, LDLCALC, TRIG, CHOLHDL, LDLDIRECT in the last 72 hours. Thyroid Function Tests: No results for input(s): TSH, T4TOTAL, FREET4, T3FREE, THYROIDAB in the last 72 hours. Anemia Panel: No results for input(s): VITAMINB12, FOLATE, FERRITIN, TIBC, IRON, RETICCTPCT in the last 72 hours. Urine analysis:    Component Value Date/Time   COLORURINE YELLOW 08/10/2020 1521   APPEARANCEUR HAZY (A) 08/10/2020 1521   LABSPEC 1.014 08/10/2020 1521   PHURINE 6.0 08/10/2020 1521   GLUCOSEU NEGATIVE 08/10/2020 1521   HGBUR MODERATE (A) 08/10/2020 1521   BILIRUBINUR NEGATIVE  08/10/2020 1521   KETONESUR NEGATIVE 08/10/2020 1521   PROTEINUR 30 (A) 08/10/2020 1521   UROBILINOGEN 1.0 07/28/2012 0027   NITRITE NEGATIVE 08/10/2020 1521   LEUKOCYTESUR NEGATIVE 08/10/2020 1521    Radiological Exams on Admission: US Abdomen Complete  Result Date: 08/10/2020 CLINICAL DATA:  Abdominal pain and back pain for 2 days, elevated LFTs EXAM: ABDOMEN ULTRASOUND COMPLETE COMPARISON:  07/28/2012 Correlation: CT abdomen and pelvis 11/08/2011 FINDINGS: Gallbladder: Contracted. Wall thickness inadequately assessed due to degree of contraction. Question minimal sludge within gallbladder. No shadowing calculi or wall thickening evident. No pericholecystic fluid or sonographic Murphy sign. Common bile duct: Diameter: 6 mm. Contains scattered internal echoes question debris. No shadowing calcifications to suggest choledocholithiasis. Liver: Upper normal echogenicity. No mass or nodularity. No definite intrahepatic biliary dilatation. Portal vein is patent on color Doppler imaging with normal direction of blood flow towards the liver. IVC: Normal appearance Pancreas: Distal tail obscured by bowel gas. Visualized portion normal appearance. Spleen: Normal appearance, 5.9 cm length Right Kidney: Length: 13.3 cm. Normal morphology without mass or hydronephrosis. Left Kidney: Length: 10.3 cm. Normal morphology without mass or hydronephrosis. Abdominal aorta: Mid to distal portions obscured by bowel gas. Visualized portion normal caliber Other findings: No free fluid IMPRESSION: Contracted gallbladder, limiting evaluation, though appearing to contain a small amount of sludge; gallbladder calculi seen on prior ultrasound not identified on current limited gallbladder assessment. Common bile duct measures 6 mm diameter, containing scattered internal echoes question debris but no shadowing calculi. Correlation with LFTs recommended. If there is clinical concern for choledocholithiasis, recommend MRCP. Incomplete  pancreatic and aortic visualization. Electronically Signed   By: Ulyses Southward M.D.   On: 08/10/2020 15:44     Assessment/Plan Principal Problem:   Hypokalemia due to excessive renal loss of potassium Active Problems:   Depression   Hypomagnesemia   Nicotine dependence    Hypokalemia/hypomagnesemia Related to diuretic use Patient was on atenolol/chlorthalidone Supplement potassium and magnesium levels.   Nicotine dependence Smoking cessation was discussed with patient in detail She declines a nicotine transdermal patch at this time   Elevated lipase levels ?  Acute pancreatitis Patient complained of left upper quadrant pain with radiation to her back but this has improved We will place on a clear liquid diet for now   DVT prophylaxis: Lovenox Code Status: full code Family Communication: Greater than 50% of time was spent discussing patient's condition and plan of care with her at the bedside.  All questions and concerns have been addressed.  She verbalizes understanding and agrees with the plan.  Disposition Plan: Back to previous home environment Consults called: none Status: At the time of admission, it appears that the appropriate admission status for this patient is inpatient. This is judged to be reasonable and necessary in order to provide the required intensity of service to ensure the patient's safety given the presenting symptoms, physical exam findings and initial radiographic and laboratory data in the context of their comorbid conditions. Patient requires inpatient status due to high intensity of service, high risk for further deterioration and high frequency of surveillance required.    Lucile Shutters MD [facility]

## 2020-08-10 NOTE — ED Notes (Signed)
Date and time results received: 08/10/20 & 1550hrs   Test: Potassium Critical Value: Less than 2.0  Name of Provider Notified: Keenan Bachelor  Orders Received? Or Actions Taken?: notified

## 2020-08-10 NOTE — ED Triage Notes (Signed)
Pt had blood work performed yesterday and a value of 2 has been reported for her potassium.  Denies any symptoms.

## 2020-08-10 NOTE — ED Notes (Signed)
Patient transported to Ultrasound 

## 2020-08-11 LAB — BASIC METABOLIC PANEL
Anion gap: 10 (ref 5–15)
BUN: 8 mg/dL (ref 6–20)
CO2: 28 mmol/L (ref 22–32)
Calcium: 6.4 mg/dL — CL (ref 8.9–10.3)
Chloride: 92 mmol/L — ABNORMAL LOW (ref 98–111)
Creatinine, Ser: 1.07 mg/dL — ABNORMAL HIGH (ref 0.44–1.00)
GFR, Estimated: >60 mL/min (ref >60)
Glucose, Bld: 119 mg/dL — ABNORMAL HIGH (ref 70–99)
Potassium: 2.5 mmol/L — CL (ref 3.5–5.1)
Sodium: 130 mmol/L — ABNORMAL LOW (ref 135–145)

## 2020-08-11 LAB — CBC
HCT: 28.3 % — ABNORMAL LOW (ref 36.0–46.0)
Hemoglobin: 9.6 g/dL — ABNORMAL LOW (ref 12.0–15.0)
MCH: 31.5 pg (ref 26.0–34.0)
MCHC: 33.9 g/dL (ref 30.0–36.0)
MCV: 92.8 fL (ref 80.0–100.0)
Platelets: 445 10*3/uL — ABNORMAL HIGH (ref 150–400)
RBC: 3.05 MIL/uL — ABNORMAL LOW (ref 3.87–5.11)
RDW: 13.2 % (ref 11.5–15.5)
WBC: 13.6 10*3/uL — ABNORMAL HIGH (ref 4.0–10.5)
nRBC: 0 % (ref 0.0–0.2)

## 2020-08-11 LAB — RENAL FUNCTION PANEL
Albumin: 2.2 g/dL — ABNORMAL LOW (ref 3.5–5.0)
Anion gap: 10 (ref 5–15)
BUN: 8 mg/dL (ref 6–20)
CO2: 27 mmol/L (ref 22–32)
Calcium: 6.6 mg/dL — ABNORMAL LOW (ref 8.9–10.3)
Chloride: 95 mmol/L — ABNORMAL LOW (ref 98–111)
Creatinine, Ser: 1.1 mg/dL — ABNORMAL HIGH (ref 0.44–1.00)
GFR, Estimated: >60 mL/min (ref >60)
Glucose, Bld: 114 mg/dL — ABNORMAL HIGH (ref 70–99)
Phosphorus: 1.4 mg/dL — ABNORMAL LOW (ref 2.5–4.6)
Potassium: 2.5 mmol/L — CL (ref 3.5–5.1)
Sodium: 132 mmol/L — ABNORMAL LOW (ref 135–145)

## 2020-08-11 LAB — HIV ANTIBODY (ROUTINE TESTING W REFLEX): HIV Screen 4th Generation wRfx: NONREACTIVE

## 2020-08-11 LAB — MAGNESIUM: Magnesium: 2.4 mg/dL (ref 1.7–2.4)

## 2020-08-11 MED ORDER — POTASSIUM CHLORIDE 10 MEQ/100ML IV SOLN
10.0000 meq | INTRAVENOUS | Status: AC
  Administered 2020-08-11 (×4): 10 meq via INTRAVENOUS
  Filled 2020-08-11 (×3): qty 100

## 2020-08-11 MED ORDER — CALCIUM CARBONATE ANTACID 500 MG PO CHEW
1.0000 | CHEWABLE_TABLET | Freq: Three times a day (TID) | ORAL | Status: DC
  Administered 2020-08-11 – 2020-08-12 (×5): 200 mg via ORAL
  Filled 2020-08-11 (×5): qty 1

## 2020-08-11 MED ORDER — POTASSIUM CHLORIDE CRYS ER 20 MEQ PO TBCR
40.0000 meq | EXTENDED_RELEASE_TABLET | ORAL | Status: AC
  Administered 2020-08-11 (×3): 40 meq via ORAL
  Filled 2020-08-11 (×3): qty 2

## 2020-08-11 MED ORDER — CALCIUM GLUCONATE-NACL 1-0.675 GM/50ML-% IV SOLN
1.0000 g | Freq: Once | INTRAVENOUS | Status: AC
  Administered 2020-08-11: 1000 mg via INTRAVENOUS
  Filled 2020-08-11: qty 50

## 2020-08-11 NOTE — Progress Notes (Signed)
Date and time results received: 08/11/20 0635  Test: Potassium and Calcium  Critical Value: Potassium 2.5, Calcium 6.4  Name of Provider Notified: Zierle-Ghosh   Orders Received? None at this time.

## 2020-08-11 NOTE — Progress Notes (Signed)
Patient Demographics:    Kimberly Patrick, is a 35 y.o. female, DOB - [birth year], IOE:[id number]  Admit date - 08/10/2020   Admitting Physician Lucile Shutters, MD  Outpatient Primary MD for the patient is [facility], [facility] Personal  LOS - 1   Chief Complaint  Patient presents with  . Abnormal Lab        Subjective:    Kimberly Patrick today has no fevers, no emesis,  No chest pain,  -No diarrhea, patient complains  myalgias with cramps, and significant fatigue  Assessment  & Plan :    Principal Problem:   Hypokalemia due to excessive renal loss of potassium Active Problems:   Depression   Hypomagnesemia   Nicotine dependence  Brief Summary:- 35 y.o. female with medical history significant for hypertension, nicotine dependence, gallbladder disease admitted on 08/10/2020 with hypokalemia, hypocalcemia and hyponatremia in the setting of chlorthalidone use  A/p 1) severe persistent hypokalemia/hypocalcemia/hypochloremia and hyponatremia/hypomagnesemia----multiple electrolyte derangements -Continue to replace IV and orally as electrolyte levels remain abnormal -Symptomatic electrolyte derangements with  myalgias with cramps, and significant fatigue -Chlorthalidone has been discontinued  2)AKI----acute kidney injury due to dehydration in the setting of chlorthalidone use -Continue to hold chlorthalidone, baseline creatinine usually 0.7-0.8 on admission creatinine was 1.23, with hydration creatinine currently down to 1.1 -- renally adjust medications, avoid nephrotoxic agents / dehydration  / hypotension  3)H/o HTN-patient somewhat hypotensive, BP soft despite IV fluids and discontinuation of atenolol chlorthalidone =-Continue to monitor closely  Disposition/Need for in-Hospital Stay- patient unable to be discharged at this time due to --- significant electrolyte abnormalities  requiring IV replacements, hypotension requiring IV fluids  Status is: Inpatient  Remains inpatient appropriate because:Please see disposition above   Disposition: The patient is from: Home              Anticipated d/c is to: Home              Anticipated d/c date is: 1 day              Patient currently is not medically stable to d/c. Barriers: Not Clinically Stable-   Code Status :  -  Code Status: Full Code   Family Communication:    NA (patient is alert, awake and coherent)   Consults  :    DVT Prophylaxis  :   - SCD  enoxaparin (LOVENOX) injection 40 mg Start: 08/10/20 2200    Lab Results  Component Value Date   PLT 445 (H) 08/11/2020    Inpatient Medications  Scheduled Meds: . calcium carbonate  1 tablet Oral TID  . enoxaparin (LOVENOX) injection  40 mg Subcutaneous Q24H  . pantoprazole  40 mg Oral Daily  . [START ON 08/14/2020] Vitamin D (Ergocalciferol)  50,000 Units Oral Q Sat   Continuous Infusions: . 0.9 % NaCl with KCl 40 mEq / L 100 mL/hr at 08/11/20 0401   PRN Meds:.acetaminophen **OR** acetaminophen, ondansetron **OR** ondansetron (ZOFRAN) IV    Anti-infectives (From admission, onward)   None        Objective:   Vitals:   08/10/20 2154 08/11/20 0245 08/11/20 0626 08/11/20 1305  BP: 116/64 (!) 107/58 (!) 96/54 (!) 105/59  Pulse: 97 88 92 85  Resp:  18 18 18 18   Temp: (!) 97.5 F (36.4 C) 98.8 F (37.1 C) 98.7 F (37.1 C) 98.5 F (36.9 C)  TempSrc: Oral Oral Oral Oral  SpO2: 100% 100% 98% 100%  Weight:      Height:        Wt Readings from Last 3 Encounters:  08/10/20 100.2 kg  03/15/16 90.7 kg  05/06/15 113.4 kg    Intake/Output Summary (Last 24 hours) at 08/11/2020 1844 Last data filed at 08/11/2020 1700 Gross per 24 hour  Intake 1686.02 ml  Output --  Net 1686.02 ml   Physical Exam  Gen:- Awake Alert,  In no apparent distress  HEENT:- [place].AT, No sclera icterus Neck-Supple Neck,No JVD,.  Lungs-  CTAB , fair symmetrical air  movement CV- S1, S2 normal, regular  Abd-  +ve B.Sounds, Abd Soft, No tenderness,    Extremity/Skin:- No  edema, pedal pulses present Psych-affect is appropriate, oriented x3 Neuro-no new focal deficits, no tremors   Data Review:   Micro Results Recent Results (from the past 240 hour(s))  Resp Panel by RT-PCR (Flu A&B, Covid) Nasopharyngeal Swab     Status: None   Collection Time: 08/10/20  6:30 PM   Specimen: Nasopharyngeal Swab; Nasopharyngeal(NP) swabs in vial transport medium  Result Value Ref Range Status   SARS Coronavirus 2 by RT PCR NEGATIVE NEGATIVE Final    Comment: (NOTE) SARS-CoV-2 target nucleic acids are NOT DETECTED.  The SARS-CoV-2 RNA is generally detectable in upper respiratory specimens during the acute phase of infection. The lowest concentration of SARS-CoV-2 viral copies this assay can detect is 138 copies/mL. A negative result does not preclude SARS-Cov-2 infection and should not be used as the sole basis for treatment or other patient management decisions. A negative result may occur with  improper specimen collection/handling, submission of specimen other than nasopharyngeal swab, presence of viral mutation(s) within the areas targeted by this assay, and inadequate number of viral copies(<138 copies/mL). A negative result must be combined with clinical observations, patient history, and epidemiological information. The expected result is Negative.  Fact Sheet for Patients:  10/10/20  Fact Sheet for Healthcare Providers:  BloggerCourse.com  This test is no t yet approved or cleared by the [employer].[place] FDA and  has been authorized for detection and/or diagnosis of SARS-CoV-2 by FDA under an Emergency Use Authorization (EUA). This EUA will remain  in effect (meaning this test can be used) for the duration of the COVID-19 declaration under Section 564(b)(1) of the Act, 21 U.S.C.section  360bbb-3(b)(1), unless the authorization is terminated  or revoked sooner.       Influenza A by PCR NEGATIVE NEGATIVE Final   Influenza B by PCR NEGATIVE NEGATIVE Final    Comment: (NOTE) The Xpert Xpress SARS-CoV-2/FLU/RSV plus assay is intended as an aid in the diagnosis of influenza from Nasopharyngeal swab specimens and should not be used as a sole basis for treatment. Nasal washings and aspirates are unacceptable for Xpert Xpress SARS-CoV-2/FLU/RSV testing.  Fact Sheet for Patients: [place]  Fact Sheet for Healthcare Providers: BloggerCourse.com  This test is not yet approved or cleared by the [employer]ious[employer]ker.[employer] FDA and has been authorized for detection and/or diagnosis of SARS-CoV-2 by FDA under an Emergency Use Authorization (EUA). This EUA will remain in effect (meaning this test can be used) for the duration of the COVID-19 declaration under Section 564(b)(1) of the Act, 21 U.S.C. section 360bbb-3(b)(1), unless the authorization is terminated or revoked.  Performed at [facility], [phone]  [address] [place], [place] [address]     Radiology Reports US Abdomen Complete  Result Date: 08/10/2020 CLINICAL DATA:  Abdominal pain and back pain for 2 days, elevated LFTs EXAM: ABDOMEN ULTRASOUND COMPLETE COMPARISON:  07/28/2012 Correlation: CT abdomen and pelvis 11/08/2011 FINDINGS: Gallbladder: Contracted. Wall thickness inadequately assessed due to degree of contraction. Question minimal sludge within gallbladder. No shadowing calculi or wall thickening evident. No pericholecystic fluid or sonographic Murphy sign. Common bile duct: Diameter: 6 mm. Contains scattered internal echoes question debris. No shadowing calcifications to suggest choledocholithiasis. Liver: Upper normal echogenicity. No mass or nodularity. No definite intrahepatic biliary dilatation. Portal vein is patent on color Doppler imaging with normal direction of  blood flow towards the liver. IVC: Normal appearance Pancreas: Distal tail obscured by bowel gas. Visualized portion normal appearance. Spleen: Normal appearance, 5.9 cm length Right Kidney: Length: 13.3 cm. Normal morphology without mass or hydronephrosis. Left Kidney: Length: 10.3 cm. Normal morphology without mass or hydronephrosis. Abdominal aorta: Mid to distal portions obscured by bowel gas. Visualized portion normal caliber Other findings: No free fluid IMPRESSION: Contracted gallbladder, limiting evaluation, though appearing to contain a small amount of sludge; gallbladder calculi seen on prior ultrasound not identified on current limited gallbladder assessment. Common bile duct measures 6 mm diameter, containing scattered internal echoes question debris but no shadowing calculi. Correlation with LFTs recommended. If there is clinical concern for choledocholithiasis, recommend MRCP. Incomplete pancreatic and aortic visualization. Electronically Signed   By: Ulyses Southward M.D.   On: 08/10/2020 15:44     CBC Recent Labs  Lab 08/10/20 1435 08/11/20 0455  WBC 14.9* 13.6*  HGB 11.6* 9.6*  HCT 33.9* 28.3*  PLT 458* 445*  MCV 92.4 92.8  MCH 31.6 31.5  MCHC 34.2 33.9  RDW 13.2 13.2  LYMPHSABS 1.1  --   MONOABS 0.9  --   EOSABS 0.3  --   BASOSABS 0.1  --     Chemistries  Recent Labs  Lab 08/10/20 1435 08/11/20 0455  NA 132* 132*  130*  K <2.0* 2.5*  2.5*  CL 86* 95*  92*  CO2 31 27  28   GLUCOSE 106* 114*  119*  BUN 12 8  8   CREATININE 1.23* 1.10*  1.07*  CALCIUM 7.1* 6.6*  6.4*  MG 1.2* 2.4  AST 27  --   ALT 18  --   ALKPHOS 87  --   BILITOT 0.5  --    ------------------------------------------------------------------------------------------------------------------ No results for input(s): CHOL, HDL, LDLCALC, TRIG, CHOLHDL, LDLDIRECT in the last 72 hours.  No results found for:  HGBA1C ------------------------------------------------------------------------------------------------------------------ No results for input(s): TSH, T4TOTAL, T3FREE, THYROIDAB in the last 72 hours.  Invalid input(s): FREET3 ------------------------------------------------------------------------------------------------------------------ No results for input(s): VITAMINB12, FOLATE, FERRITIN, TIBC, IRON, RETICCTPCT in the last 72 hours.  Coagulation profile No results for input(s): INR, PROTIME in the last 168 hours.  No results for input(s): DDIMER in the last 72 hours.  Cardiac Enzymes No results for input(s): CKMB, TROPONINI, MYOGLOBIN in the last 168 hours.  Invalid input(s): CK ------------------------------------------------------------------------------------------------------------------ No results found for: BNP   M.D on 08/11/2020 at 6:44 PM  Go to [email or web address] - for contact info  [facility] - Office  [phone]

## 2020-08-11 NOTE — Progress Notes (Signed)
   08/11/20 0900  Provider Notification  Provider Name/Title Dr Shon Hale, MD  Date Provider Notified 08/11/20  Time Provider Notified 0900  Notification Type Page  Notification Reason Critical result (K+ 2.5)  Test performed and critical result K+ 2.5  Date Critical Result Received 08/11/20  Time Critical Result Received 0900  Provider response No new orders (states patient is receiving replacement)  Date of Provider Response 08/11/20  Time of Provider Response [phone]

## 2020-08-12 DIAGNOSIS — E669 Obesity, unspecified: Secondary | ICD-10-CM | POA: Diagnosis present

## 2020-08-12 DIAGNOSIS — I10 Essential (primary) hypertension: Secondary | ICD-10-CM | POA: Diagnosis present

## 2020-08-12 LAB — CBC
HCT: 29.8 % — ABNORMAL LOW (ref 36.0–46.0)
Hemoglobin: 9.7 g/dL — ABNORMAL LOW (ref 12.0–15.0)
MCH: 31.3 pg (ref 26.0–34.0)
MCHC: 32.6 g/dL (ref 30.0–36.0)
MCV: 96.1 fL (ref 80.0–100.0)
Platelets: 450 10*3/uL — ABNORMAL HIGH (ref 150–400)
RBC: 3.1 MIL/uL — ABNORMAL LOW (ref 3.87–5.11)
RDW: 13.8 % (ref 11.5–15.5)
WBC: 17 10*3/uL — ABNORMAL HIGH (ref 4.0–10.5)
nRBC: 0 % (ref 0.0–0.2)

## 2020-08-12 LAB — RENAL FUNCTION PANEL
Albumin: 2.1 g/dL — ABNORMAL LOW (ref 3.5–5.0)
Anion gap: 6 (ref 5–15)
BUN: 6 mg/dL (ref 6–20)
CO2: 26 mmol/L (ref 22–32)
Calcium: 8.3 mg/dL — ABNORMAL LOW (ref 8.9–10.3)
Chloride: 104 mmol/L (ref 98–111)
Creatinine, Ser: 0.95 mg/dL (ref 0.44–1.00)
GFR, Estimated: >60 mL/min (ref >60)
Glucose, Bld: 118 mg/dL — ABNORMAL HIGH (ref 70–99)
Phosphorus: 1.3 mg/dL — ABNORMAL LOW (ref 2.5–4.6)
Potassium: 3.3 mmol/L — ABNORMAL LOW (ref 3.5–5.1)
Sodium: 136 mmol/L (ref 135–145)

## 2020-08-12 LAB — MAGNESIUM: Magnesium: 1.6 mg/dL — ABNORMAL LOW (ref 1.7–2.4)

## 2020-08-12 MED ORDER — POTASSIUM CHLORIDE CRYS ER 20 MEQ PO TBCR
40.0000 meq | EXTENDED_RELEASE_TABLET | ORAL | Status: AC
  Administered 2020-08-12 (×2): 40 meq via ORAL
  Filled 2020-08-12 (×2): qty 2

## 2020-08-12 MED ORDER — MAGNESIUM SULFATE 4 GM/100ML IV SOLN
4.0000 g | Freq: Once | INTRAVENOUS | Status: AC
  Administered 2020-08-12: 4 g via INTRAVENOUS
  Filled 2020-08-12: qty 100

## 2020-08-12 MED ORDER — POTASSIUM PHOSPHATES 15 MMOLE/5ML IV SOLN
30.0000 mmol | Freq: Once | INTRAVENOUS | Status: AC
  Administered 2020-08-12: 30 mmol via INTRAVENOUS
  Filled 2020-08-12: qty 10

## 2020-08-12 MED ORDER — ACETAMINOPHEN 325 MG PO TABS: 650.0000 mg | ORAL_TABLET | Freq: Four times a day (QID) | ORAL | 0 refills | Status: DC | PRN

## 2020-08-12 MED ORDER — POTASSIUM CHLORIDE ER 20 MEQ PO TBCR: 20.0000 meq | EXTENDED_RELEASE_TABLET | Freq: Every day | ORAL | 1 refills | Status: DC

## 2020-08-12 MED ORDER — CALCIUM CARBONATE ANTACID 500 MG PO CHEW: 1.0000 | CHEWABLE_TABLET | Freq: Three times a day (TID) | ORAL | 0 refills | Status: AC

## 2020-08-12 MED ORDER — OMEPRAZOLE MAGNESIUM 20 MG PO TBEC: 20.0000 mg | DELAYED_RELEASE_TABLET | Freq: Every day | ORAL | 5 refills | Status: DC

## 2020-08-12 MED ORDER — ATENOLOL 50 MG PO TABS: 50.0000 mg | ORAL_TABLET | Freq: Every day | ORAL | 11 refills | Status: DC

## 2020-08-12 NOTE — Discharge Instructions (Addendum)
1)Repeat BMP (chem 8) and CBC on Monday 08/16/2020 with your Primary Care provider at [facility] Dept   2)If your Repeat CBC blood test continues to show high White Blood count then your Primary Care Physician may do additional test or refer you to a Hematologist   3)Stop Chlorthalidone as it is making you potassium and electrolytes low--

## 2020-08-12 NOTE — Discharge Summary (Signed)
Kimberly Patrick, is a 35 y.o. female  DOB 06/25/1985  MRN [id number].  Admission date:  08/10/2020  Admitting Physician  Lucile Shutters, MD  Discharge Date:  08/12/2020   Primary MD  Health, [facility] Dept Personal  Recommendations for primary care physician for things to follow:   1)Repeat BMP (chem 8) and CBC on Monday 08/16/2020 with your Primary Care provider at [facility] Dept   2)If your Repeat CBC blood test continues to show high White Blood count then your Primary Care Physician may do additional test or refer you to a Hematologist   3)Stop Chlorthalidone as it is making you potassium and electrolytes low--   Admission Diagnosis  Hypokalemia [E87.6] Acute pancreatitis, unspecified complication status, unspecified pancreatitis type [K85.90] Hypokalemia due to excessive renal loss of potassium [E87.6]   Discharge Diagnosis  Hypokalemia [E87.6] Acute pancreatitis, unspecified complication status, unspecified pancreatitis type [K85.90] Hypokalemia due to excessive renal loss of potassium [E87.6]    Principal Problem:   Hypokalemia due to excessive renal loss of potassium Active Problems:   Hypomagnesemia   Serum phosphorus decreased   HTN (hypertension)   Depression   Nicotine dependence   Obesity (BMI 30-39.9)      Past Medical History:  Diagnosis Date   Depression    Gallstones and inflammation of gallbladder without obstruction 03/28/2012   Heart palpitations    Hypertension     History reviewed. No pertinent surgical history.     HPI  from the history and physical done on the day of admission:     Chief Complaint: Back pain HPI: Kimberly Patrick is a 35 y.o. female with medical history significant for hypertension, nicotine dependence, gallbladder disease who was referred to the emergency room by her primary care provider for treatment of  hypokalemia. Patient was seen at her primary care provider's office 1 day prior to her admission and was said to be hypotensive.  She had lab work drawn and her primary care provider advised her to discontinue her blood pressure medication, patient was on (atenolol/chlorthalidone). She was called on the day of admission and advised to go to the ER because her potassium level was less than 2. She complains of pain in her left upper quadrant and back but denies having any nausea, no vomiting, no changes in her bowel habits, no fever, no chills, no urinary symptoms, no dizziness, no lightheadedness, no palpitations, no diaphoresis, no focal deficits, no blurred vision. Labs show sodium 132, potassium less than 2, chloride 86, bicarb 31, glucose 106, BUN 12, creatinine 1.23 over a baseline of 0.81, calcium 7.1, anion gap 15, magnesium 1.2, alkaline phosphatase 87, albumin 2.5, lipase 109, AST 27, ALT 18, total protein 7.8, white count 14.9, hemoglobin 11.6, hematocrit 33.9, MCV 92.4, RDW 13.2, platelet count 458 Urine pregnancy test is negative Urinalysis is sterile Abdominal ultrasound shows contracted gallbladder, limiting evaluation, though appearing to contain a small amount of sludge; gallbladder calculi seen on prior ultrasound not identified on current limited gallbladder assessment.  Common bile duct measures 6 mm diameter, containing scattered internal echoes question debris but no shadowing calculi. Correlation with LFTs recommended.       ED Course: Patient is a 35 year old African-American female who was sent to the emergency room by her primary care provider for evaluation of electrolyte abnormalities which showed hypokalemia as well as hypomagnesemia. Patient had been on atenolol/chlorthalidone for hypertension and this was recently discontinued because her blood pressure was running low. She will be admitted to the hospital for further evaluation.     Hospital Course:     Brief  Summary:- 35 y.o. female with medical history significant for hypertension, nicotine dependence, gallbladder disease admitted on 08/10/2020 with hypokalemia, hypocalcemia and hyponatremia in the setting of chlorthalidone use -On admission she was very symptomatic with cramps/myalgias and weakness   A/p 1) severe persistent hypokalemia/hypocalcemia/hypochloremia and hyponatremia/hypomagnesemia/hypophosphatemia----multiple electrolyte derangements -Patient required aggressive IV and oral replacement of electrolytes -Chlorthalidone has been discontinued -Repeat BMP next week advised   2)AKI----acute kidney injury due to dehydration in the setting of chlorthalidone use Discontinued chlorthalidone, baseline creatinine usually 0.7-0.8 on admission creatinine was 1.23, with hydration creatinine currently down to 0.9 -- renally adjust medications, avoid nephrotoxic agents / dehydration  / hypotension   3)H/o HTN-stop chlorthalidone, okay to resume atenolol  4)Leukocytosis--- no evidence of acute infection at this time repeat CBC with PCP on 08/16/20--- -Review of records shows persistent leukocytosis in the past may need hematology referral   Disposition/--- discharge home in stable condition    Disposition: The patient is from: Home              Anticipated d/c is to: Home                Code Status :  -  Code Status: Full Code    Discharge Condition:-stable  Follow UP   Follow-up Information     Health, [facility] Dept Personal. Schedule an appointment as soon as possible for a visit on 08/16/2020.   Why: For Blood work Solicitor information: [address] [place] [address] [phone]                  Consults obtained - na  Diet and Activity recommendation:  As advised  Discharge Instructions    Discharge Instructions     Call MD for:  difficulty breathing, headache or visual disturbances   Complete by: As directed    Call MD for:  extreme fatigue    Complete by: As directed    Call MD for:  persistant dizziness or light-headedness   Complete by: As directed    Call MD for:  temperature >100.4   Complete by: As directed    Diet - low sodium heart healthy   Complete by: As directed    Discharge instructions   Complete by: As directed    1)Repeat BMP (chem 8) and CBC on Monday 08/16/2020 with your Primary Care provider at [facility] Dept   2)If your Repeat CBC blood test continues to show high White Blood count then your Primary Care Physician may do additional test or refer you to a Hematologist   3)Stop Chlorthalidone as it is making you potassium and electrolytes low--   Increase activity slowly   Complete by: As directed          Discharge Medications     Allergies as of 08/12/2020       Reactions   Gadopentetate Hives, Other (See Comments), Swelling  Sneezing, coughing, hives, lip swelling   Ivp Dye [iodinated Diagnostic Agents]         Medication List     STOP taking these medications    atenolol-chlorthalidone 50-25 MG tablet Commonly known as: TENORETIC   esomeprazole 40 MG capsule Commonly known as: NEXIUM   ibuprofen 200 MG tablet Commonly known as: ADVIL   medroxyPROGESTERone 150 MG/ML injection Commonly known as: DEPO-PROVERA   nystatin 100000 UNIT/ML suspension Commonly known as: MYCOSTATIN   ondansetron 4 MG disintegrating tablet Commonly known as: ZOFRAN-ODT       TAKE these medications    acetaminophen 325 MG tablet Commonly known as: TYLENOL Take 2 tablets (650 mg total) by mouth every 6 (six) hours as needed for mild pain (or Fever >/= 101).   atenolol 50 MG tablet Commonly known as: Tenormin Take 1 tablet (50 mg total) by mouth daily. For BP Start taking on: August 13, 2020   calcium carbonate 500 MG chewable tablet Commonly known as: TUMS - dosed in mg elemental calcium Chew 1 tablet (200 mg of elemental calcium total) by mouth 3 (three) times daily for 10  days.   omeprazole 20 MG tablet Commonly known as: PriLOSEC OTC Take 1 tablet (20 mg total) by mouth daily.   Potassium Chloride ER 20 MEQ Tbcr Take 20 mEq by mouth daily for 5 days. 1 tab daily by mouth Start taking on: August 13, 2020   Vitamin D (Ergocalciferol) 1.25 MG (50000 UNIT) Caps capsule Commonly known as: DRISDOL Take 50,000 Units by mouth every 7 (seven) days.        Major procedures and Radiology Reports - PLEASE review detailed and final reports for all details, in brief -   US Abdomen Complete  Result Date: 08/10/2020 CLINICAL DATA:  Abdominal pain and back pain for 2 days, elevated LFTs EXAM: ABDOMEN ULTRASOUND COMPLETE COMPARISON:  07/28/2012 Correlation: CT abdomen and pelvis 11/08/2011 FINDINGS: Gallbladder: Contracted. Wall thickness inadequately assessed due to degree of contraction. Question minimal sludge within gallbladder. No shadowing calculi or wall thickening evident. No pericholecystic fluid or sonographic Murphy sign. Common bile duct: Diameter: 6 mm. Contains scattered internal echoes question debris. No shadowing calcifications to suggest choledocholithiasis. Liver: Upper normal echogenicity. No mass or nodularity. No definite intrahepatic biliary dilatation. Portal vein is patent on color Doppler imaging with normal direction of blood flow towards the liver. IVC: Normal appearance Pancreas: Distal tail obscured by bowel gas. Visualized portion normal appearance. Spleen: Normal appearance, 5.9 cm length Right Kidney: Length: 13.3 cm. Normal morphology without mass or hydronephrosis. Left Kidney: Length: 10.3 cm. Normal morphology without mass or hydronephrosis. Abdominal aorta: Mid to distal portions obscured by bowel gas. Visualized portion normal caliber Other findings: No free fluid IMPRESSION: Contracted gallbladder, limiting evaluation, though appearing to contain a small amount of sludge; gallbladder calculi seen on prior ultrasound not identified on current  limited gallbladder assessment. Common bile duct measures 6 mm diameter, containing scattered internal echoes question debris but no shadowing calculi. Correlation with LFTs recommended. If there is clinical concern for choledocholithiasis, recommend MRCP. Incomplete pancreatic and aortic visualization. Electronically Signed   By: Ulyses Southward M.D.   On: 08/10/2020 15:44    Micro Results  Recent Results (from the past 240 hour(s))  Resp Panel by RT-PCR (Flu A&B, Covid) Nasopharyngeal Swab     Status: None   Collection Time: 08/10/20  6:30 PM   Specimen: Nasopharyngeal Swab; Nasopharyngeal(NP) swabs in vial transport medium  Result Value Ref Range Status  SARS Coronavirus 2 by RT PCR NEGATIVE NEGATIVE Final    Comment: (NOTE) SARS-CoV-2 target nucleic acids are NOT DETECTED.  The SARS-CoV-2 RNA is generally detectable in upper respiratory specimens during the acute phase of infection. The lowest concentration of SARS-CoV-2 viral copies this assay can detect is 138 copies/mL. A negative result does not preclude SARS-Cov-2 infection and should not be used as the sole basis for treatment or other patient management decisions. A negative result may occur with  improper specimen collection/handling, submission of specimen other than nasopharyngeal swab, presence of viral mutation(s) within the areas targeted by this assay, and inadequate number of viral copies(<138 copies/mL). A negative result must be combined with clinical observations, patient history, and epidemiological information. The expected result is Negative.  Fact Sheet for Patients:  BloggerCourse.comhttps://www.fda.gov/media/152166/download  Fact Sheet for Healthcare Providers:  SeriousBroker.ithttps://www.fda.gov/media/152162/download  This test is no t yet approved or cleared by the [place] FDA and  has been authorized for detection and/or diagnosis of SARS-CoV-2 by FDA under an Emergency Use Authorization (EUA). This EUA will remain  in effect  (meaning this test can be used) for the duration of the COVID-19 declaration under Section 564(b)(1) of the Act, 21 U.S.C.section 360bbb-3(b)(1), unless the authorization is terminated  or revoked sooner.       Influenza A by PCR NEGATIVE NEGATIVE Final   Influenza B by PCR NEGATIVE NEGATIVE Final    Comment: (NOTE) The Xpert Xpress SARS-CoV-2/FLU/RSV plus assay is intended as an aid in the diagnosis of influenza from Nasopharyngeal swab specimens and should not be used as a sole basis for treatment. Nasal washings and aspirates are unacceptable for Xpert Xpress SARS-CoV-2/FLU/RSV testing.  Fact Sheet for Patients: BloggerCourse.comhttps://www.fda.gov/media/152166/download  Fact Sheet for Healthcare Providers: SeriousBroker.ithttps://www.fda.gov/media/152162/download  This test is not yet approved or cleared by the [place] FDA and has been authorized for detection and/or diagnosis of SARS-CoV-2 by FDA under an Emergency Use Authorization (EUA). This EUA will remain in effect (meaning this test can be used) for the duration of the COVID-19 declaration under Section 564(b)(1) of the Act, 21 U.S.C. section 360bbb-3(b)(1), unless the authorization is terminated or revoked.  Performed at [facility], [address] [place], [place] [id number]        Today   Subjective    Hassan Rowanatrice Schellenberg today has no new complaints  Eating and drinking well, no vomiting no diarrhea -Ambulating without dyspnea, no pleuritic symptoms, no chest pains no palpitations no dizziness        No fever  Or chills    Patient has been seen and examined prior to discharge   Objective   Blood pressure 112/76, pulse 85, temperature 97.8 F (36.6 C), temperature source Oral, resp. rate 20, height 5\' 6"  (1.676 m), weight 100.2 kg, SpO2 100 %.   Intake/Output Summary (Last 24 hours) at 08/12/2020 1319 Last data filed at 08/12/2020 0900 Gross per 24 hour  Intake 480 ml  Output --  Net 480 ml    Exam Gen:- Awake Alert, no  acute distress  HEENT:- [place].AT, No sclera icterus Neck-Supple Neck,No JVD,.  Lungs-  CTAB , good air movement bilaterally  CV- S1, S2 normal, regular Abd-  +ve B.Sounds, Abd Soft, No tenderness,    Extremity/Skin:- No  edema,   good pulses Psych-affect is appropriate, oriented x3 Neuro-no new focal deficits, no tremors    Data Review   CBC w Diff:  Lab Results  Component Value Date   WBC 17.0 (H) 08/12/2020  HGB 9.7 (L) 08/12/2020   HGB 10.8 03/28/2012   HCT 29.8 (L) 08/12/2020   HCT 33 03/28/2012   PLT 450 (H) 08/12/2020   PLT 349 03/28/2012   LYMPHOPCT 7 08/10/2020   BANDSPCT 0 08/26/2006   MONOPCT 6 08/10/2020   EOSPCT 2 08/10/2020   BASOPCT 1 08/10/2020    CMP:  Lab Results  Component Value Date   NA 136 08/12/2020   K 3.3 (L) 08/12/2020   CL 104 08/12/2020   CO2 26 08/12/2020   BUN 6 08/12/2020   CREATININE 0.95 08/12/2020   PROT 7.8 08/10/2020   ALBUMIN 2.1 (L) 08/12/2020   BILITOT 0.5 08/10/2020   ALKPHOS 87 08/10/2020   AST 27 08/10/2020   ALT 18 08/10/2020  .   Total Discharge time is about 33 minutes  Shon Hale M.D on 08/12/2020 at 1:19 PM  Go to [email or web address] -  for contact info  [facility] - Office  [phone]

## 2020-08-12 NOTE — Progress Notes (Signed)
Nsg Discharge Note  Admit Date:  08/10/2020 Discharge date: 08/12/2020   Jacqulynn Cadet to be D/C'd Home per MD order.  AVS completed.  Copy for chart, and copy for patient signed, and dated. Patient/caregiver able to verbalize understanding.  Discharge Medication: Allergies as of 08/12/2020       Reactions   Gadopentetate Hives, Other (See Comments), Swelling   Sneezing, coughing, hives, lip swelling   Ivp Dye [iodinated Diagnostic Agents]         Medication List     STOP taking these medications    atenolol-chlorthalidone 50-25 MG tablet Commonly known as: TENORETIC   esomeprazole 40 MG capsule Commonly known as: NEXIUM   ibuprofen 200 MG tablet Commonly known as: ADVIL   medroxyPROGESTERone 150 MG/ML injection Commonly known as: DEPO-PROVERA   nystatin 100000 UNIT/ML suspension Commonly known as: MYCOSTATIN   ondansetron 4 MG disintegrating tablet Commonly known as: ZOFRAN-ODT       TAKE these medications    acetaminophen 325 MG tablet Commonly known as: TYLENOL Take 2 tablets (650 mg total) by mouth every 6 (six) hours as needed for mild pain (or Fever >/= 101).   atenolol 50 MG tablet Commonly known as: Tenormin Take 1 tablet (50 mg total) by mouth daily. For BP Start taking on: August 13, 2020   calcium carbonate 500 MG chewable tablet Commonly known as: TUMS - dosed in mg elemental calcium Chew 1 tablet (200 mg of elemental calcium total) by mouth 3 (three) times daily for 10 days.   omeprazole 20 MG tablet Commonly known as: PriLOSEC OTC Take 1 tablet (20 mg total) by mouth daily.   Potassium Chloride ER 20 MEQ Tbcr Take 20 mEq by mouth daily for 5 days. 1 tab daily by mouth Start taking on: August 13, 2020   Vitamin D (Ergocalciferol) 1.25 MG (50000 UNIT) Caps capsule Commonly known as: DRISDOL Take 50,000 Units by mouth every 7 (seven) days.        Discharge Assessment: Vitals:   08/12/20 0900 08/12/20 1252  BP: 108/62 112/76   Pulse: 93 85  Resp: 18 20  Temp: 98.1 F (36.7 C) 97.8 F (36.6 C)  SpO2: 100% 100%   Skin clean, dry and intact without evidence of skin break down, no evidence of skin tears noted. IV catheter discontinued intact. Site without signs and symptoms of complications - no redness or edema noted at insertion site, patient denies c/o pain - only slight tenderness at site.  Dressing with slight pressure applied.  D/c Instructions-Education: Discharge instructions given to patient/family with verbalized understanding. D/c education completed with patient/family including follow up instructions, medication list, d/c activities limitations if indicated, with other d/c instructions as indicated by MD - patient able to verbalize understanding, all questions fully answered. Patient instructed to return to ED, call 911, or call MD for any changes in condition.  Patient escorted via WC, and D/C home via private auto.  Carole Civil, RN 08/12/2020 3:50 PM

## 2021-05-30 ENCOUNTER — Other Ambulatory Visit: Payer: Self-pay

## 2021-05-30 ENCOUNTER — Encounter (HOSPITAL_COMMUNITY): Payer: Self-pay | Admitting: Emergency Medicine

## 2021-05-30 ENCOUNTER — Inpatient Hospital Stay (HOSPITAL_COMMUNITY)
Admission: EM | Admit: 2021-05-30 | Discharge: 2021-06-03 | DRG: 439 | Disposition: A | Discharge status: Home / Self Care | Payer: Medicaid Other | Attending: Family Medicine | Admitting: Family Medicine

## 2021-05-30 ENCOUNTER — Emergency Department (HOSPITAL_COMMUNITY): Payer: Medicaid Other

## 2021-05-30 DIAGNOSIS — F1010 Alcohol abuse, uncomplicated: Secondary | ICD-10-CM | POA: Diagnosis not present

## 2021-05-30 DIAGNOSIS — F17200 Nicotine dependence, unspecified, uncomplicated: Secondary | ICD-10-CM | POA: Diagnosis present

## 2021-05-30 DIAGNOSIS — K852 Alcohol induced acute pancreatitis without necrosis or infection: Secondary | ICD-10-CM | POA: Diagnosis not present

## 2021-05-30 DIAGNOSIS — Z79899 Other long term (current) drug therapy: Secondary | ICD-10-CM

## 2021-05-30 DIAGNOSIS — F17210 Nicotine dependence, cigarettes, uncomplicated: Secondary | ICD-10-CM | POA: Diagnosis present

## 2021-05-30 DIAGNOSIS — Z888 Allergy status to other drugs, medicaments and biological substances status: Secondary | ICD-10-CM | POA: Diagnosis not present

## 2021-05-30 DIAGNOSIS — K8020 Calculus of gallbladder without cholecystitis without obstruction: Secondary | ICD-10-CM | POA: Diagnosis present

## 2021-05-30 DIAGNOSIS — E669 Obesity, unspecified: Secondary | ICD-10-CM | POA: Diagnosis present

## 2021-05-30 DIAGNOSIS — K863 Pseudocyst of pancreas: Secondary | ICD-10-CM | POA: Diagnosis present

## 2021-05-30 DIAGNOSIS — K219 Gastro-esophageal reflux disease without esophagitis: Secondary | ICD-10-CM | POA: Diagnosis present

## 2021-05-30 DIAGNOSIS — K8500 Idiopathic acute pancreatitis without necrosis or infection: Secondary | ICD-10-CM | POA: Diagnosis not present

## 2021-05-30 DIAGNOSIS — R739 Hyperglycemia, unspecified: Secondary | ICD-10-CM | POA: Diagnosis present

## 2021-05-30 DIAGNOSIS — Z6835 Body mass index (BMI) 35.0-35.9, adult: Secondary | ICD-10-CM

## 2021-05-30 DIAGNOSIS — R8281 Pyuria: Secondary | ICD-10-CM | POA: Diagnosis present

## 2021-05-30 DIAGNOSIS — K8000 Calculus of gallbladder with acute cholecystitis without obstruction: Secondary | ICD-10-CM | POA: Diagnosis not present

## 2021-05-30 DIAGNOSIS — E876 Hypokalemia: Secondary | ICD-10-CM | POA: Diagnosis present

## 2021-05-30 DIAGNOSIS — Z8249 Family history of ischemic heart disease and other diseases of the circulatory system: Secondary | ICD-10-CM | POA: Diagnosis not present

## 2021-05-30 DIAGNOSIS — R81 Glycosuria: Secondary | ICD-10-CM | POA: Diagnosis present

## 2021-05-30 DIAGNOSIS — F32A Depression, unspecified: Secondary | ICD-10-CM | POA: Diagnosis present

## 2021-05-30 DIAGNOSIS — I10 Essential (primary) hypertension: Secondary | ICD-10-CM | POA: Diagnosis present

## 2021-05-30 DIAGNOSIS — Z91041 Radiographic dye allergy status: Secondary | ICD-10-CM | POA: Diagnosis not present

## 2021-05-30 DIAGNOSIS — K859 Acute pancreatitis without necrosis or infection, unspecified: Principal | ICD-10-CM | POA: Diagnosis present

## 2021-05-30 LAB — COMPREHENSIVE METABOLIC PANEL
ALT: 16 U/L (ref 0–44)
AST: 28 U/L (ref 15–41)
Albumin: 3.6 g/dL (ref 3.5–5.0)
Alkaline Phosphatase: 68 U/L (ref 38–126)
Anion gap: 11 (ref 5–15)
BUN: 7 mg/dL (ref 6–20)
CO2: 30 mmol/L (ref 22–32)
Calcium: 8.9 mg/dL (ref 8.9–10.3)
Chloride: 96 mmol/L — ABNORMAL LOW (ref 98–111)
Creatinine, Ser: 0.8 mg/dL (ref 0.44–1.00)
GFR, Estimated: >60 mL/min (ref >60)
Glucose, Bld: 154 mg/dL — ABNORMAL HIGH (ref 70–99)
Potassium: 2.7 mmol/L — CL (ref 3.5–5.1)
Sodium: 137 mmol/L (ref 135–145)
Total Bilirubin: 1.3 mg/dL — ABNORMAL HIGH (ref 0.3–1.2)
Total Protein: 7.9 g/dL (ref 6.5–8.1)

## 2021-05-30 LAB — CBC
HCT: 42 % (ref 36.0–46.0)
Hemoglobin: 14.1 g/dL (ref 12.0–15.0)
MCH: 30.3 pg (ref 26.0–34.0)
MCHC: 33.6 g/dL (ref 30.0–36.0)
MCV: 90.1 fL (ref 80.0–100.0)
Platelets: 289 10*3/uL (ref 150–400)
RBC: 4.66 MIL/uL (ref 3.87–5.11)
RDW: 14.6 % (ref 11.5–15.5)
WBC: 16.9 10*3/uL — ABNORMAL HIGH (ref 4.0–10.5)
nRBC: 0 % (ref 0.0–0.2)

## 2021-05-30 LAB — URINALYSIS, ROUTINE W REFLEX MICROSCOPIC
Glucose, UA: 100 mg/dL — AB
Ketones, ur: NEGATIVE mg/dL
Leukocytes,Ua: NEGATIVE
Nitrite: POSITIVE — AB
Protein, ur: 100 mg/dL — AB
Specific Gravity, Urine: 1.025 (ref 1.005–1.030)
pH: 6.5 (ref 5.0–8.0)

## 2021-05-30 LAB — URINALYSIS, MICROSCOPIC (REFLEX)

## 2021-05-30 LAB — MAGNESIUM: Magnesium: 1.6 mg/dL — ABNORMAL LOW (ref 1.7–2.4)

## 2021-05-30 LAB — PREGNANCY, URINE: Preg Test, Ur: NEGATIVE

## 2021-05-30 LAB — LIPASE, BLOOD: Lipase: 356 U/L — ABNORMAL HIGH (ref 11–51)

## 2021-05-30 MED ORDER — POTASSIUM CHLORIDE CRYS ER 20 MEQ PO TBCR
40.0000 meq | EXTENDED_RELEASE_TABLET | Freq: Once | ORAL | Status: AC
  Administered 2021-05-30: 40 meq via ORAL
  Filled 2021-05-30: qty 2

## 2021-05-30 MED ORDER — ONDANSETRON HCL 4 MG/2ML IJ SOLN
4.0000 mg | Freq: Once | INTRAMUSCULAR | Status: AC
  Administered 2021-05-30: 4 mg via INTRAVENOUS
  Filled 2021-05-30: qty 2

## 2021-05-30 MED ORDER — PROCHLORPERAZINE EDISYLATE 10 MG/2ML IJ SOLN: 10.0000 mg | Freq: Four times a day (QID) | INTRAMUSCULAR | Status: DC | PRN

## 2021-05-30 MED ORDER — ATENOLOL 25 MG PO TABS
50.0000 mg | ORAL_TABLET | Freq: Every day | ORAL | Status: DC
  Administered 2021-05-31 – 2021-06-03 (×4): 50 mg via ORAL
  Filled 2021-05-30 (×4): qty 2

## 2021-05-30 MED ORDER — HYDROMORPHONE HCL 1 MG/ML IJ SOLN
0.5000 mg | INTRAMUSCULAR | Status: DC | PRN
  Administered 2021-05-30 – 2021-06-01 (×8): 0.5 mg via INTRAVENOUS
  Filled 2021-05-30 (×8): qty 0.5

## 2021-05-30 MED ORDER — ENOXAPARIN SODIUM 60 MG/0.6ML IJ SOSY
50.0000 mg | PREFILLED_SYRINGE | INTRAMUSCULAR | Status: DC
  Administered 2021-05-30 – 2021-06-02 (×4): 50 mg via SUBCUTANEOUS
  Filled 2021-05-30 (×4): qty 0.6

## 2021-05-30 MED ORDER — LACTATED RINGERS IV SOLN
INTRAVENOUS | Status: AC
Start: 2021-05-30 — End: 2021-05-31

## 2021-05-30 MED ORDER — PANTOPRAZOLE SODIUM 40 MG IV SOLR
40.0000 mg | INTRAVENOUS | Status: DC
  Administered 2021-05-31: 40 mg via INTRAVENOUS
  Filled 2021-05-30 (×3): qty 10

## 2021-05-30 MED ORDER — SODIUM CHLORIDE 0.9 % IV BOLUS
1000.0000 mL | Freq: Once | INTRAVENOUS | Status: AC
  Administered 2021-05-30: 1000 mL via INTRAVENOUS

## 2021-05-30 MED ORDER — HYDROMORPHONE HCL 1 MG/ML IJ SOLN
1.0000 mg | Freq: Once | INTRAMUSCULAR | Status: AC
  Administered 2021-05-30: 1 mg via INTRAVENOUS
  Filled 2021-05-30: qty 1

## 2021-05-30 MED ORDER — POTASSIUM CHLORIDE 10 MEQ/100ML IV SOLN
10.0000 meq | INTRAVENOUS | Status: AC
  Administered 2021-05-30 (×3): 10 meq via INTRAVENOUS
  Filled 2021-05-30 (×3): qty 100

## 2021-05-30 MED ORDER — MAGNESIUM SULFATE 2 GM/50ML IV SOLN
2.0000 g | Freq: Once | INTRAVENOUS | Status: AC
  Administered 2021-05-30: 2 g via INTRAVENOUS
  Filled 2021-05-30: qty 50

## 2021-05-30 NOTE — ED Notes (Signed)
Triage nurse made aware of potassium level. ?

## 2021-05-30 NOTE — ED Triage Notes (Signed)
Pt arrived pov with complaints of abdominal and back pain that started yesterday 3pm after she left work. States she has history of kidney stones. ?

## 2021-05-30 NOTE — ED Provider Notes (Signed)
Care assumed from Kimberly Patrick, [place] ? ?Per his note, " Patient with medical history including depression, hypomagnesia, hypokalemia presents with complaints of back pain and stomach pain.  Patient states it started yesterday, initially  as back pain which she felt on the left flank, she states it started to radiate radiating into her epigastric region, she endorses episode of nausea and vomiting but denies any constipation diarrhea or any urinary symptoms.  She denies flank tenderness, she has no history of kidney stones, she denies any dysuria hematuria difficulty urination or urinary frequency, denies any vaginal discharge or vaginal bleeding.  She has no significant abdominal history no history of a NSAID use or alcohol use no history of pancreatitis.  She does  denies fevers chills chest pain shortness of breath general body aches.  She states her back pain is worse with movement but improved with rest pain does not radiate down to her legs no saddle paresthesias no urinary or bowel incontinency.  Patient states that she has had history of low potassium and magnesium due to her blood pressure medication states that she had to be hospitalized for this. " ? ? ?Procedures  ?Procedures ?Results for orders placed or performed during the hospital encounter of 05/30/21  ?Lipase, blood  ?Result Value Ref Range  ? Lipase 356 (H) 11 - 51 U/L  ?Comprehensive metabolic panel  ?Result Value Ref Range  ? Sodium 137 135 - 145 mmol/L  ? Potassium 2.7 (LL) 3.5 - 5.1 mmol/L  ? Chloride 96 (L) 98 - 111 mmol/L  ? CO2 30 22 - 32 mmol/L  ? Glucose, Bld 154 (H) 70 - 99 mg/dL  ? BUN 7 6 - 20 mg/dL  ? Creatinine, Ser 0.80 0.44 - 1.00 mg/dL  ? Calcium 8.9 8.9 - 10.3 mg/dL  ? Total Protein 7.9 6.5 - 8.1 g/dL  ? Albumin 3.6 3.5 - 5.0 g/dL  ? AST 28 15 - 41 U/L  ? ALT 16 0 - 44 U/L  ? Alkaline Phosphatase 68 38 - 126 U/L  ? Total Bilirubin 1.3 (H) 0.3 - 1.2 mg/dL  ? GFR, Estimated >60 >60 mL/min  ? Anion gap 11 5 - 15  ?CBC  ?Result Value  Ref Range  ? WBC 16.9 (H) 4.0 - 10.5 K/uL  ? RBC 4.66 3.87 - 5.11 MIL/uL  ? Hemoglobin 14.1 12.0 - 15.0 g/dL  ? HCT 42.0 36.0 - 46.0 %  ? MCV 90.1 80.0 - 100.0 fL  ? MCH 30.3 26.0 - 34.0 pg  ? MCHC 33.6 30.0 - 36.0 g/dL  ? RDW 14.6 11.5 - 15.5 %  ? Platelets 289 150 - 400 K/uL  ? nRBC 0.0 0.0 - 0.2 %  ?Magnesium  ?Result Value Ref Range  ? Magnesium 1.6 (L) 1.7 - 2.4 mg/dL  ?Pregnancy, urine  ?Result Value Ref Range  ? Preg Test, Ur NEGATIVE NEGATIVE  ? ?CT Abdomen Pelvis Wo Contrast ? ?Result Date: 05/30/2021 ?CLINICAL DATA:  Acute abdominal pain for several days, initial encounter EXAM: CT ABDOMEN AND PELVIS WITHOUT CONTRAST TECHNIQUE: Multidetector CT imaging of the abdomen and pelvis was performed following the standard protocol without IV contrast. RADIATION DOSE REDUCTION: This exam was performed according to the departmental dose-optimization program which includes automated exposure control, adjustment of the mA and/or kV according to patient size and/or use of iterative reconstruction technique. COMPARISON:  CT from 11/08/2011, ultrasound from 08/10/2020 FINDINGS: Lower chest: Minimal bibasilar atelectasis is seen. No focal confluent infiltrate is noted. Hepatobiliary: Dependent  densities are seen within the gallbladder consistent with cholelithiasis. The liver is within normal limits. Pancreas: Pancreas demonstrates significant peripancreatic inflammatory changes surrounding the entire pancreas. A focal fluid collection is noted which measures approximately 3.7 cm adjacent to the midportion of the pancreas which may represent early changes of pancreatic pseudocyst within the phlegmon. Phlegmonous changes extend along the anterior aspect of Gerota's fascia bilaterally as well as along the right pericolic gutter. They extend superiorly towards the stomach and left colon as well. Spleen: Normal in size without focal abnormality. Adrenals/Urinary Tract: Adrenal glands are within normal limits. Kidneys  demonstrate no renal calculi or obstructive changes. Bladder is decompressed. Stomach/Bowel: No obstructive changes of the colon are noted. The appendix is within normal limits. Small bowel is minimally prominent although no true obstructive changes are seen. These changes are likely reactive to the pancreatic inflammatory change. Stomach is well distended with ingested food stuffs. Vascular/Lymphatic: No significant vascular findings are present. No enlarged abdominal or pelvic lymph nodes. Reproductive: Uterus is within normal limits. There is an ovarian dermoid seen on the left which measures approximately 3.2 cm. Right adnexa appears within normal limits. Other: Free fluid is noted within the pelvis related to the pancreatic inflammatory change. Musculoskeletal: No acute or significant osseous findings. IMPRESSION: Changes consistent with acute pancreatitis. 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 Cholelithiasis without complicating factors. 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 This was not present on the prior exam of 2013. Nonemergent enhanced MRI is recommended for further evaluation. This Kimberly allow for optimum imaging. Electronically Signed   By: Inez Catalina M.D.   On: 05/30/2021 20:01   ? ? ?ED Course / MDM  ?  ?Medical Decision Making ?Amount and/or Complexity of Data Reviewed ?Labs: ordered. ?Radiology: ordered. ? ?Risk ?Prescription drug management. ? ?At shift change, pending CT abd/pelvis and admission.  ? ?CT abd/pelvis  as above ? ?8:22 PM CONSULT with Dr. Josephine Cables who accepts patient for admission  ? ? ? ? ?  ?Tivis Ringer, Julina Altmann S, PA-C ?05/30/21 2022 ? ?  ?Hayden Rasmussen, MD ?05/31/21 1036 ? ?

## 2021-05-30 NOTE — H&P (Signed)
?History and Physical  ? ? ?Patient: Kimberly Patrick ZOX:[id number] DOB: 1985-11-23 ?DOA: 05/30/2021 ?DOS: the patient was seen and examined on 05/30/2021 ?PCP: [facility] Dept Personal  ?Patient coming from: Home ? ?Chief Complaint:  ?Chief Complaint  ?Patient presents with  ? Abdominal Pain  ? ?HPI: Kimberly Patrick is a 36 y.o. female with medical history significant for  hypertension, nicotine dependence, gallbladder disease who presents to the emergency department due to the onset of abdominal pain.  Patient complained of back pain which started yesterday after getting off from work, she thought it was back pain related to work and that it will self resolve.  Later in the evening, at home, the back pain radiates to the epigastric region and with a pulse of the abdomen in a diffuse manner, this was associated with nausea and vomiting.  She complained of heartburn and endorsed history of gallstones in the past, patient also acknowledged that she drinks some alcohol mostly on weekends.  She denies chest pain, shortness of breath, prior history of pancreatitis, fever, chills. ? ?ED Course:  ?In the emergency department, she was intermittently tachypneic, BP was 152/96, other vital signs were within normal range.  Work-up in the ED showed normal CBC except for leukocytosis, normal BMP except for hypokalemia and hyperglycemia, lipase 356 urinalysis positive nitrite, glucosuria and large hemoglobin urine dipstick. ?CT abdomen and pelvis without contrast showed changes consistent with acute pancreatitis. 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 ?  ?Cholelithiasis without complicating factors. ?  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 This was not present on the prior exam of 2013.  ? ?Review of Systems: ?Review of systems as noted in the HPI. All other systems reviewed and are  negative. ? ? ?Past Medical History:  ?Diagnosis Date  ? Depression   ? Gallstones and inflammation of gallbladder without obstruction 03/28/2012  ? Heart palpitations   ? Hypertension   ? ?History reviewed. No pertinent surgical history. ? ?Social History:  reports that she has been smoking cigarettes. She has been smoking an average of .5 packs per day. She has never used smokeless tobacco. She reports current alcohol use. She reports current drug use. Drug: Marijuana. ? ? ?Allergies  ?Allergen Reactions  ? Gadopentetate Hives, Other (See Comments) and Swelling  ?  Sneezing, coughing, hives, lip swelling  ? Ivp Dye [Iodinated Contrast Media]   ? ? ?Family History  ?Problem Relation Age of Onset  ? Coronary artery disease Mother   ? Hypertension Mother   ? Breast cancer Cousin   ? Diabetes Maternal Aunt   ? Stroke Maternal Aunt   ?  ? ?Prior to Admission medications   ?Medication Sig Start Date End Date Taking? Authorizing Provider  ?acetaminophen (TYLENOL) 325 MG tablet Take 2 tablets (650 mg total) by mouth every 6 (six) hours as needed for mild pain (or Fever >/= 101). 08/12/20   Emokpae, Courage, MD  ?atenolol (TENORMIN) 50 MG tablet Take 1 tablet (50 mg total) by mouth daily. For BP 08/13/20 08/13/21  Shon Hale, MD  ?omeprazole (PRILOSEC OTC) 20 MG tablet Take 1 tablet (20 mg total) by mouth daily. 08/12/20 08/12/21  Shon Hale, MD  ?Potassium Chloride ER 20 MEQ TBCR Take 20 mEq by mouth daily for 5 days. 1 tab daily by mouth 08/13/20 08/18/20  Shon Hale, MD  ?Vitamin D, Ergocalciferol, (DRISDOL) 1.25 MG (50000  UNIT) CAPS capsule Take 50,000 Units by mouth every 7 (seven) days. 05/06/20   [provider]  ? ? ?Physical Exam: ?BP (!) 165/95 (BP Location: Left Arm)   Pulse 72   Temp 98.3 ?F (36.8 ?C) (Oral)   Resp 16   Ht 5\' 6"  (1.676 m)   Wt 100.2 kg   SpO2 99%   BMI 35.67 kg/m?  ? ?General: 36 y.o. year-old obese female well developed well nourished in no acute distress.  Alert and  oriented x3. ?HEENT: NCAT, EOMI ?Neck: Supple, trachea medial ?Cardiovascular: Regular rate and rhythm with no rubs or gallops.  No thyromegaly or JVD noted.  No lower extremity edema. 2/4 pulses in all 4 extremities. ?Respiratory: Clear to auscultation with no wheezes or rales. Good inspiratory effort. ?Abdomen: Soft, tender to palpation without guarding.  Nondistended with normal bowel sounds x4 quadrants. ?Muskuloskeletal: No cyanosis, clubbing or edema noted bilaterally ?Neuro: CN II-XII intact, strength 5/5 x 4, sensation, reflexes intact ?Skin: No ulcerative lesions noted or rashes ?Psychiatry: Judgement and insight appear normal. Mood is appropriate for condition and setting ?   ?   ?   ?Labs on Admission:  ?Basic Metabolic Panel: ?Recent Labs  ?Lab 05/30/21 ?1517 05/30/21 ?1649  ?NA 137  --   ?K 2.7*  --   ?CL 96*  --   ?CO2 30  --   ?GLUCOSE 154*  --   ?BUN 7  --   ?CREATININE 0.80  --   ?CALCIUM 8.9  --   ?MG  --  1.6*  ? ?Liver Function Tests: ?Recent Labs  ?Lab 05/30/21 ?1517  ?AST 28  ?ALT 16  ?ALKPHOS 68  ?BILITOT 1.3*  ?PROT 7.9  ?ALBUMIN 3.6  ? ?Recent Labs  ?Lab 05/30/21 ?1517  ?LIPASE 356*  ? ?No results for input(s): AMMONIA in the last 168 hours. ?CBC: ?Recent Labs  ?Lab 05/30/21 ?1517  ?WBC 16.9*  ?HGB 14.1  ?HCT 42.0  ?MCV 90.1  ?PLT 289  ? ?Cardiac Enzymes: ?No results for input(s): CKTOTAL, CKMB, CKMBINDEX, TROPONINI in the last 168 hours. ? ?BNP (last 3 results) ?No results for input(s): BNP in the last 8760 hours. ? ?ProBNP (last 3 results) ?No results for input(s): PROBNP in the last 8760 hours. ? ?CBG: ?No results for input(s): GLUCAP in the last 168 hours. ? ?Radiological Exams on Admission: ?CT Abdomen Pelvis Wo Contrast ? ?Result Date: 05/30/2021 ?CLINICAL DATA:  Acute abdominal pain for several days, initial encounter EXAM: CT ABDOMEN AND PELVIS WITHOUT CONTRAST TECHNIQUE: Multidetector CT imaging of the abdomen and pelvis was performed following the standard protocol without IV  contrast. RADIATION DOSE REDUCTION: This exam was performed according to the departmental dose-optimization program which includes automated exposure control, adjustment of the mA and/or kV according to patient size and/or use of iterative reconstruction technique. COMPARISON:  CT from 11/08/2011, ultrasound from 08/10/2020 FINDINGS: Lower chest: Minimal bibasilar atelectasis is seen. No focal confluent infiltrate is noted. Hepatobiliary: Dependent densities are seen within the gallbladder consistent with cholelithiasis. The liver is within normal limits. Pancreas: Pancreas demonstrates significant peripancreatic inflammatory changes surrounding the entire pancreas. A focal fluid collection is noted which measures approximately 3.7 cm adjacent to the midportion of the pancreas which may represent early changes of pancreatic pseudocyst within the phlegmon. Phlegmonous changes extend along the anterior aspect of Gerota's fascia bilaterally as well as along the right pericolic gutter. They extend superiorly towards the stomach and left colon as well. Spleen: Normal in size without focal  abnormality. Adrenals/Urinary Tract: Adrenal glands are within normal limits. Kidneys demonstrate no renal calculi or obstructive changes. Bladder is decompressed. Stomach/Bowel: No obstructive changes of the colon are noted. The appendix is within normal limits. Small bowel is minimally prominent although no true obstructive changes are seen. These changes are likely reactive to the pancreatic inflammatory change. Stomach is well distended with ingested food stuffs. Vascular/Lymphatic: No significant vascular findings are present. No enlarged abdominal or pelvic lymph nodes. Reproductive: Uterus is within normal limits. There is an ovarian dermoid seen on the left which measures approximately 3.2 cm. Right adnexa appears within normal limits. Other: Free fluid is noted within the pelvis related to the pancreatic inflammatory change.  Musculoskeletal: No acute or significant osseous findings. IMPRESSION: Changes consistent with acute pancreatitis. A focal fluid collection is noted adjacent to the midportion of the pancreatic body which may represent early ch

## 2021-05-30 NOTE — ED Provider Notes (Signed)
?[facility] DEPARTMENT ?Provider Note ? ? ?CSN: [id number] ?Arrival date & time: 05/30/21  1439 ? ?  ? ?History ? ?Chief Complaint  ?Patient presents with  ? Abdominal Pain  ? ? ?Kimberly MATTHES is a 36 y.o. female. ? ?HPI ? ?Patient with medical history including depression, hypomagnesia, hypokalemia presents with complaints of back pain and stomach pain.  Patient states it started yesterday, initially  as back pain which she felt on the left flank, she states it started to radiate radiating into her epigastric region, she endorses episode of nausea and vomiting but denies any constipation diarrhea or any urinary symptoms.  She denies flank tenderness, she has no history of kidney stones, she denies any dysuria hematuria difficulty urination or urinary frequency, denies any vaginal discharge or vaginal bleeding.  She has no significant abdominal history no history of a NSAID use or alcohol use no history of pancreatitis.  She does  denies fevers chills chest pain shortness of breath general body aches.  She states her back pain is worse with movement but improved with rest pain does not radiate down to her legs no saddle paresthesias no urinary or bowel incontinency.  Patient states that she has had history of low potassium and magnesium due to her blood pressure medication states that she had to be hospitalized for this. ? ?Home Medications ?Prior to Admission medications   ?Medication Sig Start Date End Date Taking? Authorizing Provider  ?acetaminophen (TYLENOL) 325 MG tablet Take 2 tablets (650 mg total) by mouth every 6 (six) hours as needed for mild pain (or Fever >/= 101). 08/12/20   Emokpae, Courage, MD  ?atenolol (TENORMIN) 50 MG tablet Take 1 tablet (50 mg total) by mouth daily. For BP 08/13/20 08/13/21  Shon Hale, MD  ?omeprazole (PRILOSEC OTC) 20 MG tablet Take 1 tablet (20 mg total) by mouth daily. 08/12/20 08/12/21  Shon Hale, MD  ?Potassium Chloride ER 20 MEQ TBCR Take 20 mEq by  mouth daily for 5 days. 1 tab daily by mouth 08/13/20 08/18/20  Shon Hale, MD  ?Vitamin D, Ergocalciferol, (DRISDOL) 1.25 MG (50000 UNIT) CAPS capsule Take 50,000 Units by mouth every 7 (seven) days. 05/06/20   [provider]  ?   ? ?Allergies    ?Gadopentetate and Ivp dye [iodinated contrast media]   ? ?Review of Systems   ?Review of Systems  ?Constitutional:  Negative for chills and fever.  ?Respiratory:  Negative for shortness of breath.   ?Cardiovascular:  Negative for chest pain.  ?Gastrointestinal:  Positive for abdominal pain, nausea and vomiting. Negative for constipation and diarrhea.  ?Musculoskeletal:  Positive for back pain.  ?Neurological:  Negative for headaches.  ? ?Physical Exam ?Updated Vital Signs ?BP (!) 159/95   Pulse 60   Temp 97.8 ?F (36.6 ?C) (Oral)   Resp 16   Ht 5\' 6"  (1.676 m)   Wt 100.2 kg   SpO2 100%   BMI 35.67 kg/m?  ?Physical Exam ?Vitals and nursing note reviewed.  ?Constitutional:   ?   General: She is not in acute distress. ?   Appearance: She is not ill-appearing.  ?HENT:  ?   Head: Normocephalic and atraumatic.  ?   Nose: No congestion.  ?Eyes:  ?   Conjunctiva/sclera: Conjunctivae normal.  ?Cardiovascular:  ?   Rate and Rhythm: Normal rate and regular rhythm.  ?   Pulses: Normal pulses.  ?   Heart sounds: No murmur heard. ?  No friction rub. No gallop.  ?  Pulmonary:  ?   Effort: No respiratory distress.  ?   Breath sounds: No wheezing, rhonchi or rales.  ?Abdominal:  ?   Palpations: Abdomen is soft.  ?   Tenderness: There is abdominal tenderness. There is no right CVA tenderness or left CVA tenderness.  ?   Comments: Abdomen nondistended normal active bowel sounds, dull to percussion, had noted tenderness in her epigastric region, there is no guarding, rebound tenderness, peritoneal sign negative Murphy sign McBurney point.  She had slight left-sided CVA tenderness.  No flank tenderness.  ?Musculoskeletal:  ?   Comments: Spine was palpated was nontender to  palpation no step-off deformities noted.  There is no overlying skin changes.  She has full range of motion 5-5 strength neurovascular intact in lower extremities.  ?Skin: ?   General: Skin is warm and dry.  ?Neurological:  ?   Mental Status: She is alert.  ?Psychiatric:     ?   Mood and Affect: Mood normal.  ? ? ?ED Results / Procedures / Treatments   ?Labs ?(all labs ordered are listed, but only abnormal results are displayed) ?Labs Reviewed  ?LIPASE, BLOOD - Abnormal; Notable for the following components:  ?    Result Value  ? Lipase 356 (*)   ? All other components within normal limits  ?COMPREHENSIVE METABOLIC PANEL - Abnormal; Notable for the following components:  ? Potassium 2.7 (*)   ? Chloride 96 (*)   ? Glucose, Bld 154 (*)   ? Total Bilirubin 1.3 (*)   ? All other components within normal limits  ?CBC - Abnormal; Notable for the following components:  ? WBC 16.9 (*)   ? All other components within normal limits  ?MAGNESIUM - Abnormal; Notable for the following components:  ? Magnesium 1.6 (*)   ? All other components within normal limits  ?PREGNANCY, URINE  ?URINALYSIS, ROUTINE W REFLEX MICROSCOPIC  ? ? ?EKG ?EKG Interpretation ? ?Date/Time:  Monday May 30 2021 19:08:18 EDT ?Ventricular Rate:  65 ?PR Interval:  148 ?QRS Duration: 95 ?QT Interval:  478 ?QTC Calculation: 498 ?R Axis:   45 ?Text Interpretation: Sinus rhythm LAE, consider biatrial enlargement Low voltage, precordial leads Borderline prolonged QT interval No old tracing to compare Confirmed by Meridee ScoreButler, Michael [phone]([id number]) on 05/30/2021 7:13:48 PM ? ?Radiology ?No results found. ? ?Procedures ?Marland Kitchen.Critical Care ?Performed by: Carroll SageFaulkner, Merie Wulf J, PA-C ?Authorized by: Carroll SageFaulkner, Sebastian Lurz J, PA-C  ? ?Critical care provider statement:  ?  Critical care time (minutes):  30 ?  Critical care time was exclusive of:  Separately billable procedures and treating other patients ?  Critical care was necessary to treat or prevent imminent or life-threatening  deterioration of the following conditions:  Metabolic crisis ?  Critical care was time spent personally by me on the following activities:  Discussions with consultants, evaluation of patient's response to treatment, examination of patient, ordering and review of laboratory studies, ordering and review of radiographic studies, ordering and performing treatments and interventions, pulse oximetry, re-evaluation of patient's condition and review of old charts ?  I assumed direction of critical care for this patient from another provider in my specialty: no    ? ? ?Medications Ordered in ED ?Medications  ?potassium chloride 10 mEq in 100 mL IVPB (10 mEq Intravenous New Bag/Given 05/30/21 1849)  ?potassium chloride SA (KLOR-CON M) CR tablet 40 mEq (has no administration in time range)  ?sodium chloride 0.9 % bolus 1,000 mL (1,000 mLs Intravenous New Bag/Given 05/30/21 1837)  ?  ondansetron [facility]) injection 4 mg (4 mg Intravenous Given 05/30/21 1826)  ?HYDROmorphone (DILAUDID) injection 1 mg (1 mg Intravenous Given 05/30/21 1826)  ?magnesium sulfate IVPB 2 g 50 mL ( Intravenous Restarted 05/30/21 1857)  ? ? ?ED Course/ Medical Decision Making/ A&P ?  ?                        ?Medical Decision Making ?Amount and/or Complexity of Data Reviewed ?Labs: ordered. ?Radiology: ordered. ? ?Risk ?Prescription drug management. ? ? ?This patient presents to the ED for concern of back and stomach pain, this involves an extensive number of treatment options, and is a complaint that carries with it a high risk of complications and morbidity.  The differential diagnosis includes pancreatitis, dissection, diverticulitis, cholecystitis ? ? ? ?Additional history obtained: ? ?Additional history obtained from N/A ?External records from outside source obtained and reviewed including N/A ? ? ?Co morbidities that complicate the patient evaluation ? ?Hypertension ? ?Social Determinants of Health: ? ?N/A ? ? ? ?Lab Tests: ? ?I Ordered, and personally  interpreted labs.  The pertinent results include: CBC shows leukocytosis of 16.9, CMP shows potassium 2.7 glucose 154, T. bili 1.3 lipase 356, magnesium 1.6, ? ? ?Imaging Studies ordered: ? ?I ordered imaging studies incl

## 2021-05-30 NOTE — ED Triage Notes (Signed)
Pt c/o abd pain and left flank pain x 1 day, reports hx of kidney stones, denies urinary symptoms  ?

## 2021-05-31 ENCOUNTER — Inpatient Hospital Stay (HOSPITAL_COMMUNITY): Payer: Medicaid Other

## 2021-05-31 DIAGNOSIS — E876 Hypokalemia: Secondary | ICD-10-CM | POA: Diagnosis not present

## 2021-05-31 DIAGNOSIS — K219 Gastro-esophageal reflux disease without esophagitis: Secondary | ICD-10-CM | POA: Diagnosis not present

## 2021-05-31 DIAGNOSIS — K8500 Idiopathic acute pancreatitis without necrosis or infection: Secondary | ICD-10-CM | POA: Diagnosis not present

## 2021-05-31 DIAGNOSIS — I10 Essential (primary) hypertension: Secondary | ICD-10-CM | POA: Diagnosis not present

## 2021-05-31 LAB — CBC
HCT: 38.3 % (ref 36.0–46.0)
Hemoglobin: 12.8 g/dL (ref 12.0–15.0)
MCH: 30.5 pg (ref 26.0–34.0)
MCHC: 33.4 g/dL (ref 30.0–36.0)
MCV: 91.4 fL (ref 80.0–100.0)
Platelets: 268 10*3/uL (ref 150–400)
RBC: 4.19 MIL/uL (ref 3.87–5.11)
RDW: 14.9 % (ref 11.5–15.5)
WBC: 17.9 10*3/uL — ABNORMAL HIGH (ref 4.0–10.5)
nRBC: 0 % (ref 0.0–0.2)

## 2021-05-31 LAB — MAGNESIUM: Magnesium: 2 mg/dL (ref 1.7–2.4)

## 2021-05-31 LAB — COMPREHENSIVE METABOLIC PANEL
ALT: 26 U/L (ref 0–44)
AST: 49 U/L — ABNORMAL HIGH (ref 15–41)
Albumin: 3 g/dL — ABNORMAL LOW (ref 3.5–5.0)
Alkaline Phosphatase: 81 U/L (ref 38–126)
Anion gap: 8 (ref 5–15)
BUN: 7 mg/dL (ref 6–20)
CO2: 27 mmol/L (ref 22–32)
Calcium: 8.9 mg/dL (ref 8.9–10.3)
Chloride: 101 mmol/L (ref 98–111)
Creatinine, Ser: 0.72 mg/dL (ref 0.44–1.00)
GFR, Estimated: >60 mL/min (ref >60)
Glucose, Bld: 116 mg/dL — ABNORMAL HIGH (ref 70–99)
Potassium: 3 mmol/L — ABNORMAL LOW (ref 3.5–5.1)
Sodium: 136 mmol/L (ref 135–145)
Total Bilirubin: 0.8 mg/dL (ref 0.3–1.2)
Total Protein: 6.9 g/dL (ref 6.5–8.1)

## 2021-05-31 LAB — TRIGLYCERIDES: Triglycerides: 167 mg/dL — ABNORMAL HIGH (ref <150)

## 2021-05-31 LAB — APTT: aPTT: 30 seconds (ref 24–36)

## 2021-05-31 LAB — PHOSPHORUS: Phosphorus: 2.7 mg/dL (ref 2.5–4.6)

## 2021-05-31 MED ORDER — LACTATED RINGERS IV SOLN: INTRAVENOUS | Status: AC

## 2021-05-31 MED ORDER — POTASSIUM CHLORIDE CRYS ER 20 MEQ PO TBCR
40.0000 meq | EXTENDED_RELEASE_TABLET | Freq: Once | ORAL | Status: AC
  Administered 2021-05-31: 40 meq via ORAL
  Filled 2021-05-31: qty 2

## 2021-05-31 MED ORDER — HYDRALAZINE HCL 20 MG/ML IJ SOLN
10.0000 mg | Freq: Four times a day (QID) | INTRAMUSCULAR | Status: DC | PRN
  Administered 2021-05-31: 10 mg via INTRAVENOUS
  Filled 2021-05-31 (×2): qty 1

## 2021-05-31 NOTE — Progress Notes (Addendum)
?Progress Note ? ?Patient: Kimberly Patrick DOB: Sep 30, 1985  ?DOA: 05/30/2021  DOS: 05/31/2021  ?  ?Brief hospital course: ?Kimberly Patrick is a 36 y.o. female with a history of cholelithiasis, tobacco use, HTN who presented to the ED 3/27 with pain in abdomen and back. She was hypertensive, afebrile with abdominal tenderness on exam. WBC 16.9k, lipase elevated at 356 with nitrite-positive pyuria on UA. CT abd/pelvis demonstrated cholelithiasis without cholecystitis, and pancreatic inflammation with focal 3.7cm fluid collection suggestive of early pseudocyst which was within phlegmonous changes extending along Gerota's fascia, down the right paracolic gutted and upward toward stomach and left colon.  ? ?Assessment and Plan: ?Acute idiopathic pancreatitis with early pseudocyst: Timeline of symptoms (1 day) is significantly more acute than is suggested by imaging findings (phlegmon, fluid collection). No trauma, provocative medications. Does drink alcohol, but not to excess per pt report. ?- Continue IVF, IV dilaudid, IV compazine ?- Will not initiate abx at this time as leukocytosis is likely reactive and pt afebrile, hemodynamically stable at this time with no findings of necrosis on imaging. If leukocytosis/clinical picture not improving, pt develops fever, etc. would check cultures and consider GI evaluation for EUS of fluid collection. ?- Check triglycerides.  ?- EtOH cessation discussed.  ? ?Cholelithiasis: No cholestatic lab changes or RUQ tenderness. No cholecystitis on U/S and though CBG is upper limit of normal, there is no more proximal intrahepatic ductal dilatation to suggest obstruction.  ?- Outpatient follow up with general surgery recommended. ? ?Hypokalemia: Improving with supplementation ?- Repeat supplementation and monitoring. Mg ok at 2.0.  ? ?HTN:  ?- Continue atenolol ? ?Tobacco use:  ?- Cessation counseling discussed ? 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 ? ?Obesity: Body mass index is 35.67 kg/m?.  ?- Would avoid GLP-1 agonists  ? ?GERD:  ?- PPI ? ?Nitrite-positive pyuria: No urinary symptoms. ? ?Subjective: Abdominal and midback pain is stable, improved with dilaudid. No significant nausea, wants to advance diet.  ? ?Objective: ?Vitals:  ? 05/30/21 2208 05/31/21 1537 05/31/21 0710 05/31/21 0824  ?BP:  (!) 188/114 (!) 179/103 (!) 157/99  ?Pulse:  66 65 72  ?Resp:  19 18   ?Temp:  98.4 ?F (36.9 ?C) 98.7 ?F (37.1 ?C)   ?TempSrc:  Oral    ?SpO2: 99% 98% 97%   ?Weight:      ?Height:      ? ?Gen: Non toxic 36 y.o. female in no distress ?Pulm: Nonlabored breathing room air. Clear ?CV: Regular rate and rhythm. No murmur, rub, or gallop. No JVD, no pitting dependent edema. ?GI: Abdomen soft, obese and tender diffusely without guarding, non-distended, with normoactive bowel sounds.  ?Ext: Warm, no deformities ?Skin: No rashes, lesions or ulcers on visualized skin. ?Neuro: Alert and oriented. No focal neurological deficits. ?Psych: Judgement and insight appear fair. Mood euthymic & affect congruent. Behavior is appropriate.   ? ?Data Personally reviewed: ?CBC: ?Recent Labs  ?Lab 05/30/21 ?1517 05/31/21 ?9432  ?WBC 16.9* 17.9*  ?HGB 14.1 12.8  ?HCT 42.0 38.3  ?MCV 90.1 91.4  ?PLT 289 268  ? ?Basic Metabolic Panel: ?Recent Labs  ?Lab 05/30/21 ?1517 05/30/21 ?1649 05/31/21 ?7614  ?NA 137  --  136  ?K 2.7*  --  3.0*  ?CL 96*  --  101  ?CO2 30  --  27  ?GLUCOSE 154*  --  116*  ?BUN 7  --  7  ?CREATININE 0.80  --  0.72  ?CALCIUM 8.9  --  8.9  ?MG  --  1.6* 2.0  ?PHOS  --   --  2.7  ? ?GFR: ?Estimated Creatinine Clearance: 116.2 mL/min (by C-G formula based on SCr of 0.72 mg/dL). ?Liver Function Tests: ?Recent Labs  ?Lab 05/30/21 ?1517 05/31/21 ?7829  ?AST 28 49*  ?ALT 16 26  ?ALKPHOS 68 81  ?BILITOT 1.3* 0.8  ?PROT 7.9 6.9  ?ALBUMIN 3.6 3.0*  ? ?Recent Labs  ?Lab 05/30/21 ?1517  ?LIPASE 356*  ? ?No results for input(s): AMMONIA in the last 168  hours. ?Coagulation Profile: ?No results for input(s): INR, PROTIME in the last 168 hours. ?Cardiac Enzymes: ?No results for input(s): CKTOTAL, CKMB, CKMBINDEX, TROPONINI in the last 168 hours. ?BNP (last 3 results) ?No results for input(s): PROBNP in the last 8760 hours. ?HbA1C: ?No results for input(s): HGBA1C in the last 72 hours. ?CBG: ?No results for input(s): GLUCAP in the last 168 hours. ?Lipid Profile: ?No results for input(s): CHOL, HDL, LDLCALC, TRIG, CHOLHDL, LDLDIRECT in the last 72 hours. ?Thyroid Function Tests: ?No results for input(s): TSH, T4TOTAL, FREET4, T3FREE, THYROIDAB in the last 72 hours. ?Anemia Panel: ?No results for input(s): VITAMINB12, FOLATE, FERRITIN, TIBC, IRON, RETICCTPCT in the last 72 hours. ?Urine analysis: ?   ?Component Value Date/Time  ? COLORURINE YELLOW 05/30/2021 1913  ? APPEARANCEUR CLEAR 05/30/2021 1913  ? LABSPEC 1.025 05/30/2021 1913  ? PHURINE 6.5 05/30/2021 1913  ? GLUCOSEU 100 (A) 05/30/2021 1913  ? HGBUR LARGE (A) 05/30/2021 1913  ? BILIRUBINUR MODERATE (A) 05/30/2021 1913  ? Kimberly Patrick NEGATIVE 05/30/2021 1913  ? PROTEINUR 100 (A) 05/30/2021 1913  ? UROBILINOGEN 1.0 07/28/2012 0027  ? NITRITE POSITIVE (A) 05/30/2021 1913  ? LEUKOCYTESUR NEGATIVE 05/30/2021 1913  ? ?No results found for this or any previous visit (from the past 240 hour(s)).   ?CT Abdomen Pelvis Wo Contrast ? ?Result Date: 05/30/2021 ?CLINICAL DATA:  Acute abdominal pain for several days, initial encounter EXAM: CT ABDOMEN AND PELVIS WITHOUT CONTRAST TECHNIQUE: Multidetector CT imaging of the abdomen and pelvis was performed following the standard protocol without IV contrast. RADIATION DOSE REDUCTION: This exam was performed according to the departmental dose-optimization program which includes automated exposure control, adjustment of the mA and/or kV according to patient size and/or use of iterative reconstruction technique. COMPARISON:  CT from 11/08/2011, ultrasound from 08/10/2020 FINDINGS: Lower  chest: Minimal bibasilar atelectasis is seen. No focal confluent infiltrate is noted. Hepatobiliary: Dependent densities are seen within the gallbladder consistent with cholelithiasis. The liver is within normal limits. Pancreas: Pancreas demonstrates significant peripancreatic inflammatory changes surrounding the entire pancreas. A focal fluid collection is noted which measures approximately 3.7 cm adjacent to the midportion of the pancreas which may represent early changes of pancreatic pseudocyst within the phlegmon. Phlegmonous changes extend along the anterior aspect of Gerota's fascia bilaterally as well as along the right pericolic gutter. They extend superiorly towards the stomach and left colon as well. Spleen: Normal in size without focal abnormality. Adrenals/Urinary Tract: Adrenal glands are within normal limits. Kidneys demonstrate no renal calculi or obstructive changes. Bladder is decompressed. Stomach/Bowel: No obstructive changes of the colon are noted. The appendix is within normal limits. Small bowel is minimally prominent although no true obstructive changes are seen. These changes are likely reactive to the pancreatic inflammatory change. Stomach is well distended with ingested food stuffs. Vascular/Lymphatic: No significant vascular findings are present. No enlarged abdominal or pelvic lymph nodes. Reproductive: Uterus is within normal limits. There is an ovarian dermoid seen on the left  which measures approximately 3.2 cm. Right adnexa appears within normal limits. Other: Free fluid is noted within the pelvis related to the pancreatic inflammatory change. Musculoskeletal: No acute or significant osseous findings. IMPRESSION: Changes consistent with acute pancreatitis. 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 Cholelithiasis without complicating factors. 3.2 cm left ovarian  dermoid. This was not present on the prior exam of 2013. Nonemergent enhanced MRI is recommended for further evaluation. This will allow for optimum imaging. Electronically Signed   By: Alcide CleverMark  Lukens M.D.   On:

## 2021-05-31 NOTE — Progress Notes (Signed)
?   05/31/21 0824  ?Vitals  ?BP (!) 157/99  ?BP Location Left Arm  ?BP Method Automatic  ?Patient Position (if appropriate) Lying  ?Pulse Rate 72  ?MEWS COLOR  ?MEWS Score Color Green  ?MEWS Score  ?MEWS Temp 0  ?MEWS Systolic 0  ?MEWS Pulse 0  ?MEWS RR 0  ?MEWS LOC 0  ?MEWS Score 0  ? ?BP previously charted as 179/103, rechecked with appropriate BP cuff size found to be 157/99.  ?

## 2021-06-01 DIAGNOSIS — K8500 Idiopathic acute pancreatitis without necrosis or infection: Secondary | ICD-10-CM | POA: Diagnosis not present

## 2021-06-01 DIAGNOSIS — I10 Essential (primary) hypertension: Secondary | ICD-10-CM | POA: Diagnosis not present

## 2021-06-01 DIAGNOSIS — E876 Hypokalemia: Secondary | ICD-10-CM | POA: Diagnosis not present

## 2021-06-01 DIAGNOSIS — K863 Pseudocyst of pancreas: Secondary | ICD-10-CM

## 2021-06-01 DIAGNOSIS — K219 Gastro-esophageal reflux disease without esophagitis: Secondary | ICD-10-CM | POA: Diagnosis not present

## 2021-06-01 DIAGNOSIS — K852 Alcohol induced acute pancreatitis without necrosis or infection: Secondary | ICD-10-CM

## 2021-06-01 DIAGNOSIS — F1010 Alcohol abuse, uncomplicated: Secondary | ICD-10-CM

## 2021-06-01 LAB — LIPASE, BLOOD: Lipase: 44 U/L (ref 11–51)

## 2021-06-01 MED ORDER — LACTATED RINGERS IV SOLN: INTRAVENOUS | Status: DC

## 2021-06-01 MED ORDER — OXYCODONE HCL 5 MG PO TABS
10.0000 mg | ORAL_TABLET | ORAL | Status: DC | PRN
  Administered 2021-06-01 – 2021-06-02 (×3): 10 mg via ORAL
  Filled 2021-06-01 (×4): qty 2

## 2021-06-01 MED ORDER — HYDROMORPHONE HCL 1 MG/ML IJ SOLN
0.5000 mg | INTRAMUSCULAR | Status: DC | PRN
  Administered 2021-06-01 (×3): 0.5 mg via INTRAVENOUS
  Filled 2021-06-01 (×3): qty 0.5

## 2021-06-01 MED ORDER — POTASSIUM CHLORIDE CRYS ER 20 MEQ PO TBCR
60.0000 meq | EXTENDED_RELEASE_TABLET | Freq: Once | ORAL | Status: AC
  Administered 2021-06-01: 60 meq via ORAL
  Filled 2021-06-01: qty 3

## 2021-06-01 NOTE — Progress Notes (Signed)
Attempted to restart IV per primary nurse request.  When entered room and explained procedure, patient stated she did not want at this time.  She stated she had already been "stuck twice."  Explained the importance of iv fluids with diagnosis.  Patient verbalized understanding but still refused iv start at this time.  Notified primary rn.  Encouraged po fluid intake with patient.  ?

## 2021-06-01 NOTE — Consult Note (Signed)
?Referring Provider: No ref. provider found ?Primary Care Physician:  [facility] ?Primary Gastroenterologist:  not previously established ? ?Date of Admission: 05/30/21 ?Date of Consultation: 06/01/21 ? ?Reason for Consultation:  acute pancreatitis with pseudocyst ? ?HPI:  ?Kimberly Patrick is a 36 y.o. year old female with medical history of depression, who presented to the ED on 3/27 with c/o back and stomach pain that started the day prior. Pain initially began in Left flank and radiated around to epigastric region with nausea and vomiting but no diarrhea or constipation. No urinary symptoms. No hx of NSAID use. no hx of pancreatitis. Back pain reportedly worse with activity but improves with rest.  ? ?ED Course: ?hypertensive with BP 152/96, other VSS.  ?Lipase 356, K+2.7, glucose 154, T bili 1.3, other LFTs WNL ?UA +nitrite, glucosuria, large Hgb, WBC 16.9, Mg 1.6 ?CT A/P w/o con with findings of acute pancreatitis, a focal fluid collection noted adjacent to midportion of pancreatic body which may represent changes of pancreatic pseudocyst within phlegmon. Cholelithaisis.  ? ?Consult: ?Notably Lipase down to 44 this morning from 356 yesterday, Triglycerides 167, WBC 17.9, AST 49, ALT, ALk phos and T bili WNL. [place] abd yesterday with cholelithiasis, no acute cholecystitis, CBC upper limits of normal to mildly dilated, no intrahepatic biliary ductal dilation to strongly suggest acute bile duct obstruction, normal appearing liver. ? ?Patient states that she started having back pain after she got off of work on 3/27. She states sometimes she has back pain that sometimes is accompanied by abdominal pain but this usually subsides spontaneously, does not feel it is associated with eating or any other precipitating factors.This time pain did not resolve. Pain then began radiating around to her abdomen, reports this was generalized. She had one episode of vomiting as she reports she had  heartburn.  Denies any history of pancreatitis or family history of pancreatic issues. She reports she will typically only have heartburn if she eats something spicy, was supposed to be on "acid pills" but willl just take a tums PRN if she needs to. She denies constipation or diarrhea. No fevers or chills. Denies rectal bleeding or melena. Denies constipation or diarrhea. No  changes in appetite or recent weight loss. Denies any new medications or supplements. Takes meds for BP and supplemental Iron. Last ETOH was the weekend of 3/18, she drank half of a "medium sized bottle" of liquor.  ? ?Social hx: smoke 1/2 PPD, reports that she may drink approx 1 liter of liquor over the course of a weekend, usually does this every other weekend. Occasionally has a beer, may drink 2-3 glasses of wine maybe every other weekend.  ?Smoke MJ daily   ?Family hx: no liver disease or CRC ? ?Last TDD:UKGUR ?Last Colonoscopy:never ? ?Past Medical History:  ?Diagnosis Date  ? Depression   ? Gallstones and inflammation of gallbladder without obstruction 03/28/2012  ? Heart palpitations   ? Hypertension   ? ? ?History reviewed. No pertinent surgical history. ? ?Prior to Admission medications   ?Medication Sig Start Date End Date Taking? Authorizing Provider  ?acetaminophen (TYLENOL) 325 MG tablet Take 2 tablets (650 mg total) by mouth every 6 (six) hours as needed for mild pain (or Fever >/= 101). 08/12/20  Yes Emokpae, Courage, MD  ?atenolol (TENORMIN) 50 MG tablet Take 1 tablet (50 mg total) by mouth daily. For BP 08/13/20 08/13/21 Yes Emokpae, Courage, MD  ?ferrous sulfate 325 (65 FE) MG tablet Take 325 mg by  mouth daily with breakfast.   Yes [provider]  ?lisinopril-hydrochlorothiazide (ZESTORETIC) 10-12.5 MG tablet Take 1 tablet by mouth daily. 04/11/21  Yes [provider]  ?omeprazole (PRILOSEC OTC) 20 MG tablet Take 1 tablet (20 mg total) by mouth daily. ?Patient not taking: Reported on 05/31/2021 08/12/20 08/12/21   Roxan Hockey, MD  ?Potassium Chloride ER 20 MEQ TBCR Take 20 mEq by mouth daily for 5 days. 1 tab daily by mouth 08/13/20 08/18/20  Roxan Hockey, MD  ?Vitamin D, Ergocalciferol, (DRISDOL) 1.25 MG (50000 UNIT) CAPS capsule Take 50,000 Units by mouth every 7 (seven) days. ?Patient not taking: Reported on 05/31/2021 05/06/20   [provider]  ? ? ?Current Facility-Administered Medications  ?Medication Dose Route Frequency Provider Last Rate Last Admin  ? atenolol (TENORMIN) tablet 50 mg  50 mg Oral Daily Adefeso, Oladapo, DO   50 mg at 06/01/21 0814  ? enoxaparin (LOVENOX) injection 50 mg  50 mg Subcutaneous Q24H Adefeso, Oladapo, DO   50 mg at 05/31/21 2300  ? hydrALAZINE (APRESOLINE) injection 10 mg  10 mg Intravenous Q6H PRN Adefeso, Oladapo, DO   10 mg at 05/31/21 0231  ? HYDROmorphone (DILAUDID) injection 0.5 mg  0.5 mg Intravenous Q3H PRN Johnson, Clanford L, MD      ? lactated ringers infusion   Intravenous Continuous Wynetta Emery, Clanford L, MD 75 mL/hr at 06/01/21 0825 New Bag at 06/01/21 0825  ? pantoprazole (PROTONIX) injection 40 mg  40 mg Intravenous Q24H Adefeso, Oladapo, DO   40 mg at 05/31/21 2044  ? prochlorperazine (COMPAZINE) injection 10 mg  10 mg Intravenous Q6H PRN Adefeso, Oladapo, DO      ? ? ?Allergies as of 05/30/2021 - Review Complete 05/30/2021  ?Allergen Reaction Noted  ? Gadopentetate Hives, Other (See Comments), and Swelling 05/06/2015  ? Ivp dye [iodinated contrast media]  03/15/2016  ? ? ?Family History  ?Problem Relation Age of Onset  ? Coronary artery disease Mother   ? Hypertension Mother   ? Breast cancer Cousin   ? Diabetes Maternal Aunt   ? Stroke Maternal Aunt   ? ? ?Social History  ? ?Socioeconomic History  ? Marital status: Single  ?  Spouse name: Not on file  ? Number of children: Not on file  ? Years of education: Not on file  ? Highest education level: Not on file  ?Occupational History  ? Not on file  ?Tobacco Use  ? Smoking status: Every Day  ?  Packs/day: 0.50  ?   Types: Cigarettes  ? Smokeless tobacco: Never  ?Vaping Use  ? Vaping Use: Never used  ?Substance and Sexual Activity  ? Alcohol use: Yes  ?  Comment: occ. use. no alcohol since preg.  ? Drug use: Yes  ?  Types: Marijuana  ?  Comment: occasionally  ? Sexual activity: Not Currently  ?  Birth control/protection: None  ?Other Topics Concern  ? Not on file  ?Social History Narrative  ? Not on file  ? ?Social Determinants of Health  ? ?Financial Resource Strain: Not on file  ?Food Insecurity: Not on file  ?Transportation Needs: Not on file  ?Physical Activity: Not on file  ?Stress: Not on file  ?Social Connections: Not on file  ?Intimate Partner Violence: Not on file  ? ?Review of Systems: ?Gen: Denies fever, chills, loss of appetite, change in weight or weight loss ?CV: Denies chest pain, heart palpitations, syncope, edema  ?Resp: Denies shortness of breath with rest, cough, wheezing ?GI:  Denies No red flag symptoms. Patient denies melena, hematochezia, vomiting, diarrhea, constipation, dysphagia, odyonophagia, early satiety or weight loss. +epigastric pain ?GU : Denies urinary burning, urinary frequency, urinary incontinence.  ?MS: Denies joint pain,swelling, cramping ?Derm: Denies rash, itching, dry skin ?Psych: Denies depression, anxiety,confusion, or memory loss ?Heme: Denies bruising, bleeding, and enlarged lymph nodes. ? ?Physical Exam: ?Vital signs in last 24 hours: ?Temp:  [98 ?F (36.7 ?C)-98.2 ?F (36.8 ?C)] 98.2 ?F (36.8 ?C) (03/29 4158) ?Pulse Rate:  [65-72] 72 (03/29 0438) ?Resp:  [17-20] 17 (03/29 0438) ?BP: (148-161)/(78-92) 158/78 (03/29 0438) ?SpO2:  [98 %-100 %] 98 % (03/29 0438) ?Last BM Date : 05/30/21 ?General:   Alert,  Well-developed, well-nourished, pleasant and cooperative in NAD ?Head:  Normocephalic and atraumatic. ?Eyes:  Sclera clear, no icterus.   Conjunctiva pink. ?Mouth:  No deformity or lesions, dentition normal. ?Lungs:  Clear throughout to auscultation.   No wheezes, crackles, or  rhonchi. No acute distress. ?Heart:  Regular rate and rhythm; no murmurs, clicks, rubs,  or gallops. ?Abdomen:  Soft, and nondistended. Mild TTP of epigastric area. No masses, hepatosplenomegaly or hernias note

## 2021-06-01 NOTE — Progress Notes (Signed)
?Progress Note ? ?Patient: Kimberly Patrick [id number] DOB: 1985-12-27  ?DOA: 05/30/2021  DOS: 06/01/2021  ?  ?Brief hospital course: ?Kimberly Patrick is a 36 y.o. female with a history of cholelithiasis, tobacco use, HTN who presented to the ED 3/27 with pain in abdomen and back. She was hypertensive, afebrile with abdominal tenderness on exam. WBC 16.9k, lipase elevated at 356 with nitrite-positive pyuria on UA. CT abd/pelvis demonstrated cholelithiasis without cholecystitis, and pancreatic inflammation with focal 3.7cm fluid collection suggestive of early pseudocyst which was within phlegmonous changes extending along Gerota's fascia, down the right paracolic gutted and upward toward stomach and left colon.  ? ?Assessment and Plan: ?Acute idiopathic pancreatitis with early pseudocyst: Timeline of symptoms (1 day) is significantly more acute than is suggested by imaging findings (phlegmon, fluid collection). No trauma, provocative medications. Does drink alcohol, but not to excess per pt report. ?- Continue IVF, IV dilaudid, IV compazine-slowly advance diet as lipase has normalized today.  Soft foods recommended. ?- Will not initiate abx at this time as leukocytosis is likely reactive and pt afebrile, hemodynamically stable at this time with no findings of necrosis on imaging. If leukocytosis/clinical picture not improving, pt develops fever, etc. would check cultures and GI evaluation for EUS of fluid collection requested. ?- Check triglycerides.  ?- EtOH cessation discussed.  ? ?Cholelithiasis: No cholestatic lab changes or RUQ tenderness. No cholecystitis on U/S and though CBG is upper limit of normal, there is no more proximal intrahepatic ductal dilatation to suggest obstruction.  ?- Outpatient follow up with general surgery recommended. ? ?Hypokalemia: Improving with supplementation ?- Repeat supplementation and monitoring. Mg ok at 2.0.  ? ?HTN:  ?- Continue atenolol ? ?Tobacco use:  ?- Cessation  counseling discussed ? 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  Can be done on outpatient basis.  ? ?Obesity: Body mass index is 35.67 kg/m?.  ?- Would avoid GLP-1 agonists  ? ?GERD:  ?- PPI ? ?Nitrite-positive pyuria: No urinary symptoms. ? ?Subjective: Patient tolerating full liquid diet well and ready to advance to soft diet reports pain mostly with ambulation ? ?Objective: ?Vitals:  ? 05/31/21 1251 05/31/21 2012 06/01/21 0355 06/01/21 0438  ?BP: (!) 161/92 (!) 148/92 (!) 157/82 (!) 158/78  ?Pulse: 70 72 65 72  ?Resp: 18 20 20 17   ?Temp: 98 ?F (36.7 ?C) 98.1 ?F (36.7 ?C) 98 ?F (36.7 ?C) 98.2 ?F (36.8 ?C)  ?TempSrc: Oral Oral Oral Oral  ?SpO2: 100% 98% 99% 98%  ?Weight:      ?Height:      ? ?Gen: Non toxic 36 y.o. female in no distress ?Pulm: Nonlabored breathing room air. Clear ?CV: Regular rate and rhythm. No murmur, rub, or gallop. No JVD, no pitting dependent edema. ?GI: Abdomen soft, obese and mild epigastric tenderness with light palpation, without guarding, non-distended, with normoactive bowel sounds.  ?Ext: Warm, no deformities ?Skin: No rashes, lesions or ulcers on visualized skin. ?Neuro: Alert and oriented. No focal neurological deficits. ?Psych: Judgement and insight appear . Mood euthymic & affect congruent. Behavior is appropriate.   ? ?Data Personally reviewed: ?CBC: ?Recent Labs  ?Lab 05/30/21 ?1517 05/31/21 ?[id number]  ?WBC 16.9* 17.9*  ?HGB 14.1 12.8  ?HCT 42.0 38.3  ?MCV 90.1 91.4  ?PLT 289 268  ? ?Basic Metabolic Panel: ?Recent Labs  ?Lab 05/30/21 ?1517 05/30/21 ?1649 05/31/21 ?[id number]  ?NA 137  --  136  ?K 2.7*  --  3.0*  ?CL 96*  --  101  ?  CO2 30  --  27  ?GLUCOSE 154*  --  116*  ?BUN 7  --  7  ?CREATININE 0.80  --  0.72  ?CALCIUM 8.9  --  8.9  ?MG  --  1.6* 2.0  ?PHOS  --   --  2.7  ? ?GFR: ?Estimated Creatinine Clearance: 116.2 mL/min (by C-G formula based on SCr of 0.72 mg/dL). ?Liver Function Tests: ?Recent Labs  ?Lab 05/30/21 ?1517 05/31/21 ?[id number]   ?AST 28 49*  ?ALT 16 26  ?ALKPHOS 68 81  ?BILITOT 1.3* 0.8  ?PROT 7.9 6.9  ?ALBUMIN 3.6 3.0*  ? ?Recent Labs  ?Lab 05/30/21 ?1517 06/01/21 ?0720  ?LIPASE 356* 44  ? ?No results for input(s): AMMONIA in the last 168 hours. ?Coagulation Profile: ?No results for input(s): INR, PROTIME in the last 168 hours. ?Cardiac Enzymes: ?No results for input(s): CKTOTAL, CKMB, CKMBINDEX, TROPONINI in the last 168 hours. ?BNP (last 3 results) ?No results for input(s): PROBNP in the last 8760 hours. ?HbA1C: ?No results for input(s): HGBA1C in the last 72 hours. ?CBG: ?No results for input(s): GLUCAP in the last 168 hours. ?Lipid Profile: ?Recent Labs  ?  05/31/21 ?[id number]  ?TRIG 167*  ? ?Thyroid Function Tests: ?No results for input(s): TSH, T4TOTAL, FREET4, T3FREE, THYROIDAB in the last 72 hours. ?Anemia Panel: ?No results for input(s): VITAMINB12, FOLATE, FERRITIN, TIBC, IRON, RETICCTPCT in the last 72 hours. ?Urine analysis: ?   ?Component Value Date/Time  ? [place] YELLOW 05/30/2021 1913  ? APPEARANCEUR CLEAR 05/30/2021 1913  ? LABSPEC 1.025 05/30/2021 1913  ? PHURINE 6.5 05/30/2021 1913  ? GLUCOSEU 100 (A) 05/30/2021 1913  ? HGBUR LARGE (A) 05/30/2021 1913  ? BILIRUBINUR MODERATE (A) 05/30/2021 1913  ? Benjamin Stain NEGATIVE 05/30/2021 1913  ? PROTEINUR 100 (A) 05/30/2021 1913  ? UROBILINOGEN 1.0 07/28/2012 0027  ? NITRITE POSITIVE (A) 05/30/2021 1913  ? LEUKOCYTESUR NEGATIVE 05/30/2021 1913  ? ?No results found for this or any previous visit (from the past 240 hour(s)).   ?CT Abdomen Pelvis Wo Contrast ? ?Result Date: 05/30/2021 ?CLINICAL DATA:  Acute abdominal pain for several days, initial encounter EXAM: CT ABDOMEN AND PELVIS WITHOUT CONTRAST TECHNIQUE: Multidetector CT imaging of the abdomen and pelvis was performed following the standard protocol without IV contrast. RADIATION DOSE REDUCTION: This exam was performed according to the departmental dose-optimization program which includes automated exposure control, adjustment  of the mA and/or kV according to patient size and/or use of iterative reconstruction technique. COMPARISON:  CT from 11/08/2011, ultrasound from 08/10/2020 FINDINGS: Lower chest: Minimal bibasilar atelectasis is seen. No focal confluent infiltrate is noted. Hepatobiliary: Dependent densities are seen within the gallbladder consistent with cholelithiasis. The liver is within normal limits. Pancreas: Pancreas demonstrates significant peripancreatic inflammatory changes surrounding the entire pancreas. A focal fluid collection is noted which measures approximately 3.7 cm adjacent to the midportion of the pancreas which may represent early changes of pancreatic pseudocyst within the phlegmon. Phlegmonous changes extend along the anterior aspect of Gerota's fascia bilaterally as well as along the right pericolic gutter. They extend superiorly towards the stomach and left colon as well. Spleen: Normal in size without focal abnormality. Adrenals/Urinary Tract: Adrenal glands are within normal limits. Kidneys demonstrate no renal calculi or obstructive changes. Bladder is decompressed. Stomach/Bowel: No obstructive changes of the colon are noted. The appendix is within normal limits. Small bowel is minimally prominent although no true obstructive changes are seen. These changes are likely reactive to the pancreatic inflammatory change. Stomach is well distended  with ingested food stuffs. Vascular/Lymphatic: No significant vascular findings are present. No enlarged abdominal or pelvic lymph nodes. Reproductive: Uterus is within normal limits. There is an ovarian dermoid seen on the left which measures approximately 3.2 cm. Right adnexa appears within normal limits. Other: Free fluid is noted within the pelvis related to the pancreatic inflammatory change. Musculoskeletal: No acute or significant osseous findings. IMPRESSION: Changes consistent with acute pancreatitis. A focal fluid collection is noted adjacent to the  midportion of the pancreatic body which may represent early changes of pancreatic pseudocyst within the phlegmon. Cholelithiasis without complicating factors. 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 This was not present on t

## 2021-06-02 LAB — HEPATIC FUNCTION PANEL
ALT: 14 U/L (ref 0–44)
AST: 16 U/L (ref 15–41)
Albumin: 2.6 g/dL — ABNORMAL LOW (ref 3.5–5.0)
Alkaline Phosphatase: 78 U/L (ref 38–126)
Bilirubin, Direct: 0.1 mg/dL (ref 0.0–0.2)
Indirect Bilirubin: 0.8 mg/dL (ref 0.3–0.9)
Total Bilirubin: 0.9 mg/dL (ref 0.3–1.2)
Total Protein: 6.5 g/dL (ref 6.5–8.1)

## 2021-06-02 LAB — LIPID PANEL
Cholesterol: 133 mg/dL (ref 0–200)
HDL: 38 mg/dL — ABNORMAL LOW (ref >40)
LDL Cholesterol: 68 mg/dL (ref 0–99)
Total CHOL/HDL Ratio: 3.5 RATIO
Triglycerides: 135 mg/dL (ref <150)
VLDL: 27 mg/dL (ref 0–40)

## 2021-06-02 LAB — BASIC METABOLIC PANEL
Anion gap: 11 (ref 5–15)
BUN: 6 mg/dL (ref 6–20)
CO2: 25 mmol/L (ref 22–32)
Calcium: 8.2 mg/dL — ABNORMAL LOW (ref 8.9–10.3)
Chloride: 98 mmol/L (ref 98–111)
Creatinine, Ser: 0.59 mg/dL (ref 0.44–1.00)
GFR, Estimated: >60 mL/min (ref >60)
Glucose, Bld: 98 mg/dL (ref 70–99)
Potassium: 3 mmol/L — ABNORMAL LOW (ref 3.5–5.1)
Sodium: 134 mmol/L — ABNORMAL LOW (ref 135–145)

## 2021-06-02 MED ORDER — OXYCODONE HCL 5 MG PO TABS
5.0000 mg | ORAL_TABLET | ORAL | Status: DC | PRN
  Administered 2021-06-02 – 2021-06-03 (×7): 5 mg via ORAL
  Filled 2021-06-02 (×6): qty 1

## 2021-06-02 MED ORDER — PANTOPRAZOLE SODIUM 40 MG PO TBEC
40.0000 mg | DELAYED_RELEASE_TABLET | Freq: Every day | ORAL | Status: DC
Start: 2021-06-02 — End: 2021-06-03
  Administered 2021-06-02 – 2021-06-03 (×2): 40 mg via ORAL
  Filled 2021-06-02 (×2): qty 1

## 2021-06-02 MED ORDER — POTASSIUM CHLORIDE CRYS ER 20 MEQ PO TBCR
60.0000 meq | EXTENDED_RELEASE_TABLET | Freq: Once | ORAL | Status: AC
  Administered 2021-06-02: 60 meq via ORAL
  Filled 2021-06-02: qty 3

## 2021-06-02 MED ORDER — HYDRALAZINE HCL 25 MG PO TABS
25.0000 mg | ORAL_TABLET | Freq: Three times a day (TID) | ORAL | Status: DC
Start: 2021-06-02 — End: 2021-06-03
  Administered 2021-06-02 – 2021-06-03 (×3): 25 mg via ORAL
  Filled 2021-06-02 (×3): qty 1

## 2021-06-02 MED ORDER — HYDROCHLOROTHIAZIDE 12.5 MG PO TABS
12.5000 mg | ORAL_TABLET | Freq: Every day | ORAL | Status: DC
  Administered 2021-06-02 – 2021-06-03 (×2): 12.5 mg via ORAL
  Filled 2021-06-02 (×2): qty 1

## 2021-06-02 MED ORDER — LISINOPRIL-HYDROCHLOROTHIAZIDE 10-12.5 MG PO TABS: 1.0000 | ORAL_TABLET | Freq: Every day | ORAL | Status: DC

## 2021-06-02 MED ORDER — LISINOPRIL 10 MG PO TABS
10.0000 mg | ORAL_TABLET | Freq: Every day | ORAL | Status: DC
  Administered 2021-06-02: 10 mg via ORAL
  Filled 2021-06-02: qty 1

## 2021-06-02 MED ORDER — FERROUS SULFATE 325 (65 FE) MG PO TABS
325.0000 mg | ORAL_TABLET | Freq: Every day | ORAL | Status: DC
  Administered 2021-06-03: 325 mg via ORAL
  Filled 2021-06-02: qty 1

## 2021-06-02 NOTE — Progress Notes (Signed)
?  Transition of Care (TOC) Screening Note ? ? ?Patient Details  ?Name: Kimberly Patrick ?Date of Birth: 09/19/1985 ? ? ?Transition of Care (TOC) CM/SW Contact:    ?Adelayde Minney D, LCSW ?Phone Number: ?06/02/2021, 4:16 PM ? ? ? ?Transition of Care Department [facility]) has reviewed patient and no TOC needs have been identified at this time. We will continue to monitor patient advancement through interdisciplinary progression rounds. If new patient transition needs arise, please place a TOC consult. ?  ?Latrica Clowers, Clydene Pugh, LCSW  ?

## 2021-06-02 NOTE — Progress Notes (Signed)
MD notified that patient refused IV at this time. Requested PO pain medication and order was received ? ?

## 2021-06-02 NOTE — Progress Notes (Signed)
? ?Gastroenterology Progress Note  ? ?Referring Provider: No ref. provider found ?Primary Care Physician:  [facility] Personal ?Primary Gastroenterologist:  Dr.Rehman (not previously assigned) ? ?Patient ID: Kimberly Patrick; [id number]; 1985-09-19  ? ?Subjective:   ? ?Still having abdominal pain but not worse with meals. Colicky. No vomiting. Feels like she wants more food. BM this morning normal. Currently without IV. Last dose of diluadid at 1756 yesterday. Pain controlled with oxycodone inially received 10mg  now 5mg  with last dose at 8am.  ? ?Objective:  ? ?Vital signs in last 24 hours: ?Temp:  [97.8 ?F (36.6 ?C)-98.7 ?F (37.1 ?C)] 98.7 ?F (37.1 ?C) (03/30 1229) ?Pulse Rate:  [66-70] 66 (03/30 1229) ?Resp:  [18-20] 18 (03/30 1229) ?BP: (153-179)/(97-106) 179/103 (03/30 1229) ?SpO2:  [97 %-100 %] 100 % (03/30 1229) ?Last BM Date : 05/30/21 ?General:   Alert,  Well-developed, well-nourished, pleasant and cooperative in NAD ?Head:  Normocephalic and atraumatic. ?Eyes:  Sclera clear, no icterus.  ?Abdomen:  Soft, nondistended. Normal bowel sounds, without guarding, and without rebound. Mild epigastric tenderness. ?Extremities:  Without clubbing, deformity or edema. ?Neurologic:  Alert and  oriented x4;  grossly normal neurologically. ?Skin:  Intact without significant lesions or rashes. ?Psych:  Alert and cooperative. Normal mood and affect. ? ?Intake/Output from previous day: ?03/29 0701 - 03/30 0700 ?In: 1104.6 [P.O.:720; I.V.:384.6] ?Out: -  ?Intake/Output this shift: ?Total I/O ?In: 566.2 [P.O.:340; I.V.:226.2] ?Out: -  ? ?Lab Results: ?CBC ?Recent Labs  ?  05/30/21 ?1517 05/31/21 ?06/01/21  ?WBC 16.9* 17.9*  ?HGB 14.1 12.8  ?HCT 42.0 38.3  ?MCV 90.1 91.4  ?PLT 289 268  ? ?BMET ?Recent Labs  ?  05/30/21 ?1517 05/31/21 ?06/01/21 06/02/21 ?0443  ?NA 137 136 134*  ?K 2.7* 3.0* 3.0*  ?CL 96* 101 98  ?CO2 30 27 25   ?GLUCOSE 154* 116* 98  ?BUN 7 7 6   ?CREATININE 0.80 0.72 0.59  ?CALCIUM 8.9 8.9 8.2*   ? ?LFTs ?Recent Labs  ?  05/30/21 ?1517 05/31/21 ? 06/02/21 ?0443  ?BILITOT 1.3* 0.8 0.9  ?BILIDIR  --   --  0.1  ?IBILI  --   --  0.8  ?ALKPHOS 68 81 78  ?AST 28 49* 16  ?ALT 16 26 14   ?PROT 7.9 6.9 6.5  ?ALBUMIN 3.6 3.0* 2.6*  ? ?Recent Labs  ?  05/30/21 ?1517 06/01/21 ?0720  ?LIPASE 356* 44  ? ?PT/INR ?No results for input(s): LABPROT, INR in the last 72 hours. ?   ? ?Imaging Studies: ?CT Abdomen Pelvis Wo Contrast ? ?Result Date: 05/30/2021 ?CLINICAL DATA:  Acute abdominal pain for several days, initial encounter EXAM: CT ABDOMEN AND PELVIS WITHOUT CONTRAST TECHNIQUE: Multidetector CT imaging of the abdomen and pelvis was performed following the standard protocol without IV contrast. RADIATION DOSE REDUCTION: This exam was performed according to the departmental dose-optimization program which includes automated exposure control, adjustment of the mA and/or kV according to patient size and/or use of iterative reconstruction technique. COMPARISON:  CT from 11/08/2011, ultrasound from 08/10/2020 FINDINGS: Lower chest: Minimal bibasilar atelectasis is seen. No focal confluent infiltrate is noted. Hepatobiliary: Dependent densities are seen within the gallbladder consistent with cholelithiasis. The liver is within normal limits. Pancreas: Pancreas demonstrates significant peripancreatic inflammatory changes surrounding the entire pancreas. A focal fluid collection is noted which measures approximately 3.7 cm adjacent to the midportion of the pancreas which may represent early changes of pancreatic pseudocyst within the phlegmon. Phlegmonous changes extend along the anterior aspect  of Gerota's fascia bilaterally as well as along the right pericolic gutter. They extend superiorly towards the stomach and left colon as well. Spleen: Normal in size without focal abnormality. Adrenals/Urinary Tract: Adrenal glands are within normal limits. Kidneys demonstrate no renal calculi or obstructive changes. Bladder is  decompressed. Stomach/Bowel: No obstructive changes of the colon are noted. The appendix is within normal limits. Small bowel is minimally prominent although no true obstructive changes are seen. These changes are likely reactive to the pancreatic inflammatory change. Stomach is well distended with ingested food stuffs. Vascular/Lymphatic: No significant vascular findings are present. No enlarged abdominal or pelvic lymph nodes. Reproductive: Uterus is within normal limits. There is an ovarian dermoid seen on the left which measures approximately 3.2 cm. Right adnexa appears within normal limits. Other: Free fluid is noted within the pelvis related to the pancreatic inflammatory change. Musculoskeletal: No acute or significant osseous findings. IMPRESSION: Changes consistent with acute pancreatitis. 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 Cholelithiasis without complicating factors. 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 This was not present on the prior exam of 2013. Nonemergent enhanced MRI is recommended for further evaluation. This will allow for optimum imaging. Electronically Signed   By: Alcide Clever M.D.   On: 05/30/2021 20:01  ? ?US Abdomen Limited ? ?Result Date: 05/31/2021 ?CLINICAL DATA:  36 year old female with acute pancreatitis by CT yesterday. EXAM: ULTRASOUND ABDOMEN LIMITED RIGHT UPPER QUADRANT COMPARISON:  CT Abdomen and Pelvis 05/30/2021. FINDINGS: Gallbladder: 10 mm shadowing echogenic gallstone (image 3). Gallbladder appears partially contracted. Wall thickness is at the upper limits of normal. No pericholecystic fluid. No sonographic Murphy sign elicited. Common bile duct: Diameter: 6-7 mm, upper limits of normal to mildly dilated. Liver: No focal lesion identified. Within normal limits in parenchymal echogenicity. Portal vein is patent on color Doppler imaging with normal direction  of blood flow towards the liver. No intrahepatic biliary ductal dilatation identified. Other: No perihepatic free fluid.  Negative visible right kidney. IMPRESSION: 1. Cholelithiasis. No evidence of acute cholecystitis. 2. CBD is at the upper limits of normal to mildly dilated, but there is no intrahepatic biliary ductal dilatation to strongly suggest acute bile duct obstruction. 3. Negative ultrasound appearance of the liver. Electronically Signed   By: Odessa Fleming M.D.   On: 05/31/2021 10:17  [2 weeks] ? ?Assessment:  ?Kimberly Patrick is a 36 year old female with medical history of depression, who presented to the ED on 3/27 with c/o back and stomach pain that started the day prior. Lipase on admission 356 with CT A/P concerning for acute pancreatitis and possible pancreatic pseudocyst. GI consulted for further evaluation. ?  ?Pancreatitis with pancreatic pseudocyst: First documented episode of pancreatitis, lipase 356 on admission.  CT with focal fluid collection noted adjacent to the midportion of the pancreatic body, suspicious for pseudocyst, measuring 3.7 cm.  No obvious necrosis.  Reportedly drinks 1 L of liquor over the course of the weekend, every other weekend.  Triglycerides minimally elevated.  Ultrasound with presence of cholelithiasis no obvious obstructing stones in the bile ducts.  LFTs unremarkable except slightly elevated AST of 49.  Pancreatitis may be secondary to alcohol use but cannot exclude biliary etiology given cholelithiasis.  Clinically improving. ?  ? ?Plan:  ? ?Recommend alcohol cessation. ?Repeat CT imaging in approximately 8 weeks to follow-up on pancreatic pseudocyst. ?Advance diet. ?Consider cholecystectomy after she recovers from  acute illness. ? ? LOS: 3 days  ? ?Leanna BattlesLeslie S. Melvyn NethLewis, PA-C ?[facility] ?[phone] ?3/30/20231:53 PM ?  ? ?

## 2021-06-02 NOTE — Progress Notes (Signed)
?Progress Note ? ?Patient: Kimberly Patrick OMB:[id number] DOB: 28-May-1985  ?DOA: 05/30/2021  DOS: 06/02/2021  ?  ?Brief hospital course: ?Kimberly Patrick is a 36 y.o. female with a history of cholelithiasis, tobacco use, HTN who presented to the ED 3/27 with pain in abdomen and back. She was hypertensive, afebrile with abdominal tenderness on exam. WBC 16.9k, lipase elevated at 356 with nitrite-positive pyuria on UA. CT abd/pelvis demonstrated cholelithiasis without cholecystitis, and pancreatic inflammation with focal 3.7cm fluid collection suggestive of early pseudocyst which was within phlegmonous changes extending along Gerota's fascia, down the right paracolic gutted and upward toward stomach and left colon.  ? ?Assessment and Plan: ?Acute idiopathic pancreatitis with early pseudocyst: Timeline of symptoms (1 day) is significantly more acute than is suggested by imaging findings (phlegmon, fluid collection). No trauma, provocative medications. Does drink alcohol, but not to excess per pt report. ?- Pt was treated with IVF, IV dilaudid, IV compazine-slowly advance diet as lipase has normalized today.  Soft foods recommended. ?- Will not initiate abx at this time as leukocytosis is likely reactive and pt afebrile, hemodynamically stable at this time with no findings of necrosis on imaging. If leukocytosis/clinical picture not improving, pt develops fever, etc. would check cultures and GI evaluation for EUS of fluid collection requested. ?- Check triglycerides.  ?- EtOH cessation discussed.  ? ?Cholelithiasis: No cholestatic lab changes or RUQ tenderness. No cholecystitis on U/S and though CBG is upper limit of normal, there is no more proximal intrahepatic ductal dilatation to suggest obstruction.  ?- Outpatient follow up with general surgery recommended. ? ?Hypokalemia: Improving with supplementation ?- Repeat supplementation and monitoring. Mg ok at 2.0.  ? ?HTN:  ?- Continue atenolol, added hydralazine 25  mg TID  ? ?Tobacco use:  ?- Cessation counseling discussed ? 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  Can be done on outpatient basis.  ? ?Obesity: Body mass index is 35.67 kg/m?.  ?- Would avoid GLP-1 agonists  ? ?GERD:  ?- PPI ? ?Nitrite-positive pyuria: No urinary symptoms. ? ?Subjective: Patient wants to advance her diet today. Pt refusing to have another IV placed.   ? ?Objective: ?Vitals:  ? 06/01/21 0438 06/01/21 2010 06/02/21 0459 06/02/21 1229  ?BP: (!) 158/78 (!) 153/97 (!) 174/106 (!) 179/103  ?Pulse: 72 70 68 66  ?Resp: 17 20 19 18   ?Temp: 98.2 ?F (36.8 ?C) 98 ?F (36.7 ?C) 97.8 ?F (36.6 ?C) 98.7 ?F (37.1 ?C)  ?TempSrc: Oral   Oral  ?SpO2: 98% 97% 100% 100%  ?Weight:      ?Height:      ? ?Gen: Non toxic 36 y.o. female in no distress ?Pulm: Nonlabored breathing room air. Clear ?CV: Regular rate and rhythm. No murmur, rub, or gallop. No JVD, no pitting dependent edema. ?GI: Abdomen soft, obese and mild epigastric tenderness with light palpation, without guarding, non-distended, with normoactive bowel sounds.  ?Ext: Warm, no deformities ?Skin: No rashes, lesions or ulcers on visualized skin. ?Neuro: Alert and oriented. No focal neurological deficits. ?Psych: Judgement and insight appear . Mood euthymic & affect congruent. Behavior is appropriate.   ? ?Data Personally reviewed: ?CBC: ?Recent Labs  ?Lab 05/30/21 ?1517 05/31/21 ?06/02/21  ?WBC 16.9* 17.9*  ?HGB 14.1 12.8  ?HCT 42.0 38.3  ?MCV 90.1 91.4  ?PLT 289 268  ? ?Basic Metabolic Panel: ?Recent Labs  ?Lab 05/30/21 ?1517 05/30/21 ?1649 05/31/21 ?06/02/21 06/02/21 ?0443  ?NA 137  --  136 134*  ?K 2.7*  --  3.0* 3.0*  ?CL 96*  --  101 98  ?CO2 30  --  27 25  ?GLUCOSE 154*  --  116* 98  ?BUN 7  --  7 6  ?CREATININE 0.80  --  0.72 0.59  ?CALCIUM 8.9  --  8.9 8.2*  ?MG  --  1.6* 2.0  --   ?PHOS  --   --  2.7  --   ? ?GFR: ?Estimated Creatinine Clearance: 116.2 mL/min (by C-G formula based on SCr of 0.59 mg/dL). ?Liver  Function Tests: ?Recent Labs  ?Lab 05/30/21 ?1517 05/31/21 ?2947 06/02/21 ?0443  ?AST 28 49* 16  ?ALT 16 26 14   ?ALKPHOS 68 81 78  ?BILITOT 1.3* 0.8 0.9  ?PROT 7.9 6.9 6.5  ?ALBUMIN 3.6 3.0* 2.6*  ? ?Recent Labs  ?Lab 05/30/21 ?1517 06/01/21 ?0720  ?LIPASE 356* 44  ? ?No results for input(s): AMMONIA in the last 168 hours. ?Coagulation Profile: ?No results for input(s): INR, PROTIME in the last 168 hours. ?Cardiac Enzymes: ?No results for input(s): CKTOTAL, CKMB, CKMBINDEX, TROPONINI in the last 168 hours. ?BNP (last 3 results) ?No results for input(s): PROBNP in the last 8760 hours. ?HbA1C: ?No results for input(s): HGBA1C in the last 72 hours. ?CBG: ?No results for input(s): GLUCAP in the last 168 hours. ?Lipid Profile: ?Recent Labs  ?  05/31/21 ?06/02/21 06/02/21 ?0443  ?CHOL  --  133  ?HDL  --  38*  ?LDLCALC  --  68  ?TRIG 167* 135  ?CHOLHDL  --  3.5  ? ?Thyroid Function Tests: ?No results for input(s): TSH, T4TOTAL, FREET4, T3FREE, THYROIDAB in the last 72 hours. ?Anemia Panel: ?No results for input(s): VITAMINB12, FOLATE, FERRITIN, TIBC, IRON, RETICCTPCT in the last 72 hours. ?Urine analysis: ?   ?Component Value Date/Time  ? [place] YELLOW 05/30/2021 1913  ? APPEARANCEUR CLEAR 05/30/2021 1913  ? LABSPEC 1.025 05/30/2021 1913  ? PHURINE 6.5 05/30/2021 1913  ? GLUCOSEU 100 (A) 05/30/2021 1913  ? HGBUR LARGE (A) 05/30/2021 1913  ? BILIRUBINUR MODERATE (A) 05/30/2021 1913  ? 06/01/2021 NEGATIVE 05/30/2021 1913  ? PROTEINUR 100 (A) 05/30/2021 1913  ? UROBILINOGEN 1.0 07/28/2012 0027  ? NITRITE POSITIVE (A) 05/30/2021 1913  ? LEUKOCYTESUR NEGATIVE 05/30/2021 1913  ? ?No results found for this or any previous visit (from the past 240 hour(s)).   ?No results found. ? ?Family Communication: Plan of care discussed with patient at bedside who verbalized understanding ? ?Disposition: ?Status is: Inpatient ?Remains inpatient appropriate because: Needs IV fluids, antiemetics, analgesics for acute pancreatitis with  pseudocyst ?Planned Discharge Destination: Home ? ? ? ? ?06/01/2021, MD ?06/02/2021 3:27 PM ?Page by [email or web address]  ?

## 2021-06-03 ENCOUNTER — Telehealth: Payer: Self-pay | Admitting: Gastroenterology

## 2021-06-03 DIAGNOSIS — K859 Acute pancreatitis without necrosis or infection, unspecified: Secondary | ICD-10-CM

## 2021-06-03 DIAGNOSIS — K8000 Calculus of gallbladder with acute cholecystitis without obstruction: Secondary | ICD-10-CM

## 2021-06-03 LAB — BASIC METABOLIC PANEL
Anion gap: 10 (ref 5–15)
BUN: 5 mg/dL — ABNORMAL LOW (ref 6–20)
CO2: 27 mmol/L (ref 22–32)
Calcium: 8.6 mg/dL — ABNORMAL LOW (ref 8.9–10.3)
Chloride: 98 mmol/L (ref 98–111)
Creatinine, Ser: 0.73 mg/dL (ref 0.44–1.00)
GFR, Estimated: >60 mL/min (ref >60)
Glucose, Bld: 98 mg/dL (ref 70–99)
Potassium: 3.1 mmol/L — ABNORMAL LOW (ref 3.5–5.1)
Sodium: 135 mmol/L (ref 135–145)

## 2021-06-03 MED ORDER — HYDRALAZINE HCL 25 MG PO TABS: 25.0000 mg | ORAL_TABLET | Freq: Three times a day (TID) | ORAL | 1 refills | Status: DC

## 2021-06-03 MED ORDER — LISINOPRIL-HYDROCHLOROTHIAZIDE 20-12.5 MG PO TABS: 1.0000 | ORAL_TABLET | Freq: Every day | ORAL | 1 refills | Status: DC

## 2021-06-03 MED ORDER — HYDRALAZINE HCL 25 MG PO TABS: 50.0000 mg | ORAL_TABLET | Freq: Three times a day (TID) | ORAL | Status: DC

## 2021-06-03 MED ORDER — LISINOPRIL 10 MG PO TABS
20.0000 mg | ORAL_TABLET | Freq: Every day | ORAL | Status: DC
  Administered 2021-06-03: 20 mg via ORAL
  Filled 2021-06-03: qty 2

## 2021-06-03 MED ORDER — POTASSIUM CHLORIDE CRYS ER 20 MEQ PO TBCR
60.0000 meq | EXTENDED_RELEASE_TABLET | Freq: Once | ORAL | Status: AC
  Administered 2021-06-03: 60 meq via ORAL
  Filled 2021-06-03: qty 3

## 2021-06-03 MED ORDER — POTASSIUM CHLORIDE CRYS ER 10 MEQ PO TBCR: 10.0000 meq | EXTENDED_RELEASE_TABLET | Freq: Every day | ORAL | 1 refills | Status: DC

## 2021-06-03 MED ORDER — OXYCODONE HCL 5 MG PO TABS: 5.0000 mg | ORAL_TABLET | Freq: Four times a day (QID) | ORAL | 0 refills | Status: AC | PRN

## 2021-06-03 MED ORDER — OMEPRAZOLE MAGNESIUM 20 MG PO TBEC: 20.0000 mg | DELAYED_RELEASE_TABLET | Freq: Every day | ORAL | 1 refills | Status: DC

## 2021-06-03 NOTE — Telephone Encounter (Signed)
Kimberly Patrick - This patient was recently hospitalized for pancreatitis and found to have pacnreatic pseudocyst. She will need repeat CT A/P without contrast (allergy) in about 8 weeks. ? ?[name] - Can you arrange for hospital follow up for this paitent with Dr. Karilyn Cota or [place]. ? ?[name] - Can you arrange general surgery referral for cholelithiasis to evaluate for cholecystectomy. ? ?[name], MSN, FNP-BC, AGACNP-BC ?[facility] ? ?

## 2021-06-03 NOTE — Discharge Summary (Addendum)
Physician Discharge Summary  ?Kimberly Patrick NWG:[id number] DOB: 05-May-1985 DOA: 05/30/2021 ? ?PCP: [facility], [facility] Dept Personal ?GI: Rehman  ? ?Admit date: 05/30/2021 ?Discharge date: 06/03/2021 ? ?Admitted From:  Home  ?Disposition: Home  ? ?Recommendations for Outpatient Follow-up:  ?Follow up with PCP in 1 weeks ?Follow up with Dr. 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 ?Discharge Condition: STABLE   ?CODE STATUS: FULL ?DIET: heart healthy low sodium soft diet recommended  ? ?Brief Hospitalization Summary: ?Please see all hospital notes, images, labs for full details of the hospitalization. ?ADMISSION HPI: Kimberly Patrick is a 36 y.o. female with medical history significant for  hypertension, nicotine dependence, gallbladder disease who presents to the emergency department due to the onset of abdominal pain.  Patient complained of back pain which started yesterday after getting off from work, she thought it was back pain related to work and that it will self resolve.  Later in the evening, at home, the back pain radiates to the epigastric region and with a pulse of the abdomen in a diffuse manner, this was associated with nausea and vomiting.  She complained of heartburn and endorsed history of gallstones in the past, patient also acknowledged that she drinks some alcohol mostly on weekends.  She denies chest pain, shortness of breath, prior history of pancreatitis, fever, chills. ?  ?ED Course:  ?In the emergency department, she was intermittently tachypneic, BP was 152/96, other vital signs were within normal range.  Work-up in the ED showed normal CBC except for leukocytosis, normal BMP except for hypokalemia and hyperglycemia, lipase  356 urinalysis positive nitrite, glucosuria and large hemoglobin urine dipstick. ?CT abdomen and pelvis without contrast showed changes consistent with acute pancreatitis. 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 ?  ?Cholelithiasis without complicating factors. ?  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 This was not present on the prior exam of 2013.  ? ?HOSPITAL COURSE BY PROBLEM LIST  ? ?Acute idiopathic pancreatitis with early pseudocyst: Timeline of symptoms (1 day) is significantly more acute than is suggested by imaging findings (phlegmon, fluid collection). No trauma, provocative medications. Does drink alcohol, but not to excess per pt report. ?- Pt was treated with IVF, IV dilaudid, IV compazine-slowly advance diet as lipase has normalized.  Soft foods recommended. ?- Will not initiate abx at this time as leukocytosis is likely reactive and pt afebrile, hemodynamically stable at this time with no findings of necrosis on imaging.  ?- lipids are optimally controlled  ?- EtOH cessation strongly advised.  ?  ?Cholelithiasis: No cholestatic lab changes or RUQ tenderness. No cholecystitis on U/S and though CBG is upper limit of normal, there is no more proximal intrahepatic ductal dilatation to suggest obstruction.  ?- Outpatient follow up with general surgery recommended.  Ambulatory referral to surgery made.  ?  ?Hypokalemia: Improving with supplementation ?- Repeat supplementation and monitoring. Mg ok at 2.0.  ?- DC on daily oral KCl 10 meq.   ?  ?HTN:  ?- Continue atenolol, added hydralazine 25 mg TID, increased zestoretic to 20/12.5 - 1 tab daily ?  ?Tobacco use:  ?- Cessation counseling discussed ?  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  Can be done on outpatient basis.  ?  ?Obesity: Body mass index is 35.67 kg/m?.  ?- Would avoid GLP-1 agonists  (given pancreatitis) ?  ?GERD:  ?- PPI ?  ?Nitrite-positive pyuria: No urinary symptoms ? ?Discharge Diagnoses:  ?Principal Problem: ?  Acute pancreatitis ?Active Problems: ?  Hypokalemia due to excessive renal loss of potassium ?  Hypomagnesemia ?  Nicotine dependence ?  HTN (hypertension) ?  Obesity (BMI 30-39.9) ?  GERD (gastroesophageal reflux disease) ?  Pancreatic pseudocyst ?  Alcohol abuse ? ? ?Discharge Instructions: ?Discharge Instructions   ? ? Ambulatory referral to General Surgery   Complete by: As directed ?  ? ?  ? ?Allergies as of 06/03/2021   ? ?   Reactions  ? Gadopentetate Hives, Other (See Comments), Swelling  ? Sneezing, coughing, hives, lip swelling  ? Ivp Dye [iodinated Contrast Media]   ? ?  ? ?  ?Medication List  ?  ? ?STOP taking these medications   ? ?lisinopril-hydrochlorothiazide 10-12.5 MG tablet ?Commonly known as: ZESTORETIC ?Replaced by: lisinopril-hydrochlorothiazide 20-12.5 MG tablet ?  ?Potassium Chloride ER 20 MEQ Tbcr ?  ? ?  ? ?TAKE these medications   ? ?acetaminophen 325 MG tablet ?Commonly known as: TYLENOL ?Take 2 tablets (650 mg total) by mouth every 6 (six) hours as needed for mild pain (or Fever >/= 101). ?  ?atenolol 50 MG tablet ?Commonly known as: Tenormin ?Take 1 tablet (50 mg total) by mouth daily. For BP ?  ?ferrous sulfate 325 (65 FE) MG tablet ?Take 325 mg by mouth daily with breakfast. ?  ?hydrALAZINE 25 MG tablet ?Commonly known as: APRESOLINE ?Take 1 tablet (25 mg total) by mouth every 8 (eight) hours. ?  ?lisinopril-hydrochlorothiazide 20-12.5 MG tablet ?Commonly known as: Zestoretic ?Take 1 tablet by mouth daily. ?Replaces: lisinopril-hydrochlorothiazide 10-12.5 MG tablet ?  ?omeprazole 20 MG tablet ?Commonly known as: PriLOSEC OTC ?Take 1 tablet (20 mg total) by mouth daily. ?  ?oxyCODONE 5 MG immediate release tablet ?Commonly known as: Oxy IR/ROXICODONE ?Take 1 tablet (5 mg total) by mouth every 6 (six) hours as needed for up to 3 days for severe pain. ?   ?potassium chloride 10 MEQ tablet ?Commonly known as: KLOR-CON M ?Take 1 tablet (10 mEq total) by mouth daily. ?Start taking on: June 04, 2021 ?  ? ?  ? ? Follow-up Information   ? ? [facility]. Schedule an appointment as soon as possible for a visit in 2 week(s).   ?Specialty: Gastroenterology ?Why: Hospital Follow Up ?Contact information: ?[address] ?[address] ?[place] ?[phone] ? ?  ?  ? ? [facility], [facility] Dept Personal. Schedule an appointment as soon as possible for a visit in 1 week(s).   ?Why: Hospital Follow Up ?Contact information: ?[address] ?[place] [address] ?[phone] ? ? ?  ?  ? ? [facility]. Schedule an appointment as soon as possible for a visit in 1 month(s).   ?Why: Hospital Follow Up regarding gallbladder surgery ?Contact information: ?[address] [place] ?[place] [phone] ?[phone] ? ?  ?  ? ?  ?  ? ?  ? ?Allergies  ?Allergen Reactions  ? Gadopentetate Hives, Other (See Comments) and Swelling  ?  Sneezing, coughing, hives, lip swelling  ? Ivp Dye [Iodinated Contrast Media]   ? ?Allergies as of 06/03/2021   ? ?   Reactions  ?  Gadopentetate Hives, Other (See Comments), Swelling  ? Sneezing, coughing, hives, lip swelling  ? Ivp Dye [iodinated Contrast Media]   ? ?  ? ?  ?Medication List  ?  ? ?STOP taking these medications   ? ?lisinopril-hydrochlorothiazide 10-12.5 MG tablet ?Commonly known as: ZESTORETIC ?Replaced by: lisinopril-hydrochlorothiazide 20-12.5 MG tablet ?  ?Potassium Chloride ER 20 MEQ Tbcr ?  ? ?  ? ?TAKE these medications   ? ?acetaminophen 325 MG tablet ?Commonly known as: TYLENOL ?Take 2 tablets (650 mg total) by mouth every 6 (six) hours as needed for mild pain (or Fever >/= 101). ?  ?atenolol 50 MG tablet ?Commonly known as: Tenormin ?Take 1 tablet (50 mg total) by mouth daily. For BP ?  ?ferrous sulfate 325 (65 FE) MG tablet ?Take 325  mg by mouth daily with breakfast. ?  ?hydrALAZINE 25 MG tablet ?Commonly known as: APRESOLINE ?Take 1 tablet (25 mg total) by mouth every 8 (eight) hours. ?  ?lisinopril-hydrochlorothiazide 20-12.5 MG tabl

## 2021-06-03 NOTE — Progress Notes (Signed)
? ?Gastroenterology Progress Note  ? ?Referring Provider: No ref. provider found ?Primary Care Physician:  [facility], [facility] Dept Personal ?Primary Gastroenterologist:  Dr. Karilyn Cota (not previously assigned) ? ?Patient ID: Kimberly Patrick; [id number]; May 23, 1985  ? ? ?Subjective  ? ? ?She reports she is able to tolerate her soft diet without need for frequent pain medication and would like to go home today. Last dose of opain medication was with morning medications. She continues to experience some diarrhea but is manageable. Feels as though she can tolerate the diarrhea on discharge. She reinforced that she was told no more alcohol and she needed to follow up for surgery.  ? ?Objective  ? ?Vital signs in last 24 hours ?Temp:  [98.2 ?F (36.8 ?C)-98.7 ?F (37.1 ?C)] 98.7 ?F (37.1 ?C) (03/31 0429) ?Pulse Rate:  [66-76] 76 (03/31 0949) ?Resp:  [18] 18 (03/31 0429) ?BP: (163-179)/(91-116) 176/93 (03/31 0949) ?SpO2:  [100 %] 100 % (03/31 0429) ?Last BM Date : 05/30/21 ? ?Physical Exam ?General:   Alert and oriented, pleasant ?Head:  Normocephalic and atraumatic. ?Eyes:  No icterus, sclera clear. Conjuctiva pink.  ?Mouth:  Without lesions, mucosa pink and moist.  ?Neck:  Supple, without thyromegaly or masses.  ?Heart:  S1, S2 present, no murmurs noted.  ?Lungs: Clear to auscultation bilaterally, without wheezing, rales, or rhonchi.  ?Abdomen:  Bowel sounds present, soft, non-tender, non-distended. No HSM or hernias noted. No rebound or guarding. No masses appreciated  ?Msk:  Symmetrical without gross deformities. Normal posture. ?Pulses:  Normal pulses noted. ?Extremities:  Without clubbing or edema. ?Neurologic:  Alert and  oriented x4;  grossly normal neurologically. ?Skin:  Warm and dry, intact without significant lesions.  ?Cervical Nodes:  No significant cervical adenopathy. ?Psych:  Alert and cooperative. Normal mood and affect. ? ?Intake/Output from previous day: ?03/30 0701 - 03/31 0700 ?In: 1526.2  [P.O.:1300; I.V.:226.2] ?Out: -  ?Intake/Output this shift: ?No intake/output data recorded. ? ?Lab Results ? ?No results for input(s): WBC, HGB, HCT, PLT in the last 72 hours. ?BMET ?Recent Labs  ?  06/02/21 ?0443 06/03/21 ?0801  ?NA 134* 135  ?K 3.0* 3.1*  ?CL 98 98  ?CO2 25 27  ?GLUCOSE 98 98  ?BUN 6 5*  ?CREATININE 0.59 0.73  ?CALCIUM 8.2* 8.6*  ? ?LFT ?Recent Labs  ?  06/02/21 ?0443  ?PROT 6.5  ?ALBUMIN 2.6*  ?AST 16  ?ALT 14  ?ALKPHOS 78  ?BILITOT 0.9  ?BILIDIR 0.1  ?IBILI 0.8  ? ?PT/INR ?No results for input(s): LABPROT, INR in the last 72 hours. ?Hepatitis Panel ?No results for input(s): HEPBSAG, HCVAB, HEPAIGM, HEPBIGM in the last 72 hours. ? ? ?Studies/Results ?CT Abdomen Pelvis Wo Contrast ? ?Result Date: 05/30/2021 ?CLINICAL DATA:  Acute abdominal pain for several days, initial encounter EXAM: CT ABDOMEN AND PELVIS WITHOUT CONTRAST TECHNIQUE: Multidetector CT imaging of the abdomen and pelvis was performed following the standard protocol without IV contrast. RADIATION DOSE REDUCTION: This exam was performed according to the departmental dose-optimization program which includes automated exposure control, adjustment of the mA and/or kV according to patient size and/or use of iterative reconstruction technique. COMPARISON:  CT from 11/08/2011, ultrasound from 08/10/2020 FINDINGS: Lower chest: Minimal bibasilar atelectasis is seen. No focal confluent infiltrate is noted. Hepatobiliary: Dependent densities are seen within the gallbladder consistent with cholelithiasis. The liver is within normal limits. Pancreas: Pancreas demonstrates significant peripancreatic inflammatory changes surrounding the entire pancreas. A focal fluid collection is noted which measures approximately 3.7 cm adjacent to  the midportion of the pancreas which may represent early changes of pancreatic pseudocyst within the phlegmon. Phlegmonous changes extend along the anterior aspect of Gerota's fascia bilaterally as well as along the  right pericolic gutter. They extend superiorly towards the stomach and left colon as well. Spleen: Normal in size without focal abnormality. Adrenals/Urinary Tract: Adrenal glands are within normal limits. Kidneys demonstrate no renal calculi or obstructive changes. Bladder is decompressed. Stomach/Bowel: No obstructive changes of the colon are noted. The appendix is within normal limits. Small bowel is minimally prominent although no true obstructive changes are seen. These changes are likely reactive to the pancreatic inflammatory change. Stomach is well distended with ingested food stuffs. Vascular/Lymphatic: No significant vascular findings are present. No enlarged abdominal or pelvic lymph nodes. Reproductive: Uterus is within normal limits. There is an ovarian dermoid seen on the left which measures approximately 3.2 cm. Right adnexa appears within normal limits. Other: Free fluid is noted within the pelvis related to the pancreatic inflammatory change. Musculoskeletal: No acute or significant osseous findings. IMPRESSION: Changes consistent with acute pancreatitis. 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 Cholelithiasis without complicating factors. 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 This was not present on the prior exam of 2013. Nonemergent enhanced MRI is recommended for further evaluation. This will allow for optimum imaging. Electronically Signed   By: Alcide Clever M.D.   On: 05/30/2021 20:01  ? ?US Abdomen Limited ? ?Result Date: 05/31/2021 ?CLINICAL DATA:  36 year old female with acute pancreatitis by CT yesterday. EXAM: ULTRASOUND ABDOMEN LIMITED RIGHT UPPER QUADRANT COMPARISON:  CT Abdomen and Pelvis 05/30/2021. FINDINGS: Gallbladder: 10 mm shadowing echogenic gallstone (image 3). Gallbladder appears partially contracted. Wall thickness is at the upper limits of normal. No  pericholecystic fluid. No sonographic Murphy sign elicited. Common bile duct: Diameter: 6-7 mm, upper limits of normal to mildly dilated. Liver: No focal lesion identified. Within normal limits in parenchymal echogenicity. Portal vein is patent on color Doppler imaging with normal direction of blood flow towards the liver. No intrahepatic biliary ductal dilatation identified. Other: No perihepatic free fluid.  Negative visible right kidney. IMPRESSION: 1. Cholelithiasis. No evidence of acute cholecystitis. 2. CBD is at the upper limits of normal to mildly dilated, but there is no intrahepatic biliary ductal dilatation to strongly suggest acute bile duct obstruction. 3. Negative ultrasound appearance of the liver. Electronically Signed   By: Odessa Fleming M.D.   On: 05/31/2021 10:17   ? ?Assessment  ?36 y.o. female with a history of depression, HTN, and cholelithiasis who presented to the ED on 05/30/2021 with back and stomach pain that started 1 day prior.  She had an elevated lipase to 356 on admission and CT A/P with concern for acute pancreatitis and possible pancreatic pseudocyst.  GI consulted for further evaluation. ? ?Pancreatitis with pancreatic pseudocyst: Lipase 356 on admission, this is her first documented episode of pancreatitis however lipase elevated to 109 in June 2022.  Potassium 2.7 on admission, slight elevation in bilirubin to 1.3, other LFTs normal, leukocytosis with WBC 16.9 however this appears chronic per chart review.  CT A/P without contrast -significant peripancreatic inflammatory changes surrounding the entire pancreas, focal fluid collection measuring approximately 3.7 cm adjacent to the midportion of the pancreas which may represent early changes of pancreatic pseudocysts in the phlegmon, stomach wall distended with ingested food, and cholelithiasis.  Limited abdominal ultrasound with 10 mm shadowing  gallstone, partially contracted gallbladder with wall thickening at the upper limits of  normal.,  Negative sonographic Murphy sign, CBD 6 to 7 mm (upper limits of normal to mildly dilated), no intrahepatic biliary ductal dilation.  LFTs normal as of yesterday 06/02/2021.She has received oxycodone 5 mg every 8-12

## 2021-06-03 NOTE — Progress Notes (Signed)
?  June 03, 2021 ? ?Patient: Kimberly Patrick  ?Date of Birth: 1985-08-11  ?Date of Visit: 05/30/2021  ? ? ?To Whom It May Concern: ? ?Kimberly Patrick was seen and treated in our  ?June 03, 2021 ? ?Patient: Kimberly Patrick  ?Date of Birth: 1985-10-04  ?Date of Visit: 05/30/2021  ? ? ?To Whom It May Concern: ? ?Kimberly Patrick was seen and treated at [facility] on 05/30/2021. Kimberly Patrick  may return to work on 06/06/21 . ? ?Sincerely,  ? ? ? ? ?Jillene Bucks, RN ?  ? ? ?  ? ? ?

## 2021-06-03 NOTE — Discharge Instructions (Addendum)
PLEASE STOP ALL ALCOHOL CONSUMPTION ? ? ?IMPORTANT INFORMATION: PAY CLOSE ATTENTION  ? ?PHYSICIAN DISCHARGE INSTRUCTIONS ? ?Follow with Primary care provider  [facility], [facility] Dept Personal  and other consultants as instructed by your Hospitalist Physician ? ?SEEK MEDICAL CARE OR RETURN TO EMERGENCY ROOM IF SYMPTOMS COME BACK, WORSEN OR NEW PROBLEM DEVELOPS  ? ?Please note: ?You were cared for by a hospitalist during your hospital stay. Every effort will be made to forward records to your primary care provider.  You can request that your primary care provider send for your hospital records if they have not received them.  Once you are discharged, your primary care physician will handle any further medical issues. Please note that NO REFILLS for any discharge medications will be authorized once you are discharged, as it is imperative that you return to your primary care physician (or establish a relationship with a primary care physician if you do not have one) for your post hospital discharge needs so that they can reassess your need for medications and monitor your lab values. ? ?Please get a complete blood count and chemistry panel checked by your Primary MD at your next visit, and again as instructed by your Primary MD. ? ?Get Medicines reviewed and adjusted: ?Please take all your medications with you for your next visit with your Primary MD ? ?Laboratory/radiological data: ?Please request your Primary MD to go over all hospital tests and procedure/radiological results at the follow up, please ask your primary care provider to get all Hospital records sent to his/her office. ? ?In some cases, they will be blood work, cultures and biopsy results pending at the time of your discharge. Please request that your primary care provider follow up on these results. ? ?If you are diabetic, please bring your blood sugar readings with you to your follow up appointment with primary care.   ? ?Please call and make  your follow up appointments as soon as possible.   ? ?Also Note the following: ?If you experience worsening of your admission symptoms, develop shortness of breath, life threatening emergency, suicidal or homicidal thoughts you must seek medical attention immediately by calling 911 or calling your MD immediately  if symptoms less severe. ? ?You must read complete instructions/literature along with all the possible adverse reactions/side effects for all the Medicines you take and that have been prescribed to you. Take any new Medicines after you have completely understood and accpet all the possible adverse reactions/side effects.  ? ?Do not drive when taking Pain medications or sleeping medications (Benzodiazepines) ? ?Do not take more than prescribed Pain, Sleep and Anxiety Medications. It is not advisable to combine anxiety,sleep and pain medications without talking with your primary care practitioner ? ?Special Instructions: If you have smoked or chewed Tobacco  in the last 2 yrs please stop smoking, stop any regular Alcohol  and or any Recreational drug use. ? ?Wear Seat belts while driving.  Do not drive if taking any narcotic, mind altering or controlled substances or recreational drugs or alcohol.  ? ? ? ? ? ?

## 2021-06-03 NOTE — Progress Notes (Signed)
Nsg Discharge Note ? ?Admit Date:  05/30/2021 ?Discharge date: 06/03/2021 ?  ?Jacqulynn Cadet to be D/C'd Home per MD order.  AVS completed. ?Patient/caregiver able to verbalize understanding. ? ?Discharge Medication: ?Allergies as of 06/03/2021   ? ?   Reactions  ? Gadopentetate Hives, Other (See Comments), Swelling  ? Sneezing, coughing, hives, lip swelling  ? Ivp Dye [iodinated Contrast Media]   ? ?  ? ?  ?Medication List  ?  ? ?STOP taking these medications   ? ?lisinopril-hydrochlorothiazide 10-12.5 MG tablet ?Commonly known as: ZESTORETIC ?Replaced by: lisinopril-hydrochlorothiazide 20-12.5 MG tablet ?  ?Potassium Chloride ER 20 MEQ Tbcr ?  ? ?  ? ?TAKE these medications   ? ?acetaminophen 325 MG tablet ?Commonly known as: TYLENOL ?Take 2 tablets (650 mg total) by mouth every 6 (six) hours as needed for mild pain (or Fever >/= 101). ?  ?atenolol 50 MG tablet ?Commonly known as: Tenormin ?Take 1 tablet (50 mg total) by mouth daily. For BP ?  ?ferrous sulfate 325 (65 FE) MG tablet ?Take 325 mg by mouth daily with breakfast. ?  ?hydrALAZINE 25 MG tablet ?Commonly known as: APRESOLINE ?Take 1 tablet (25 mg total) by mouth every 8 (eight) hours. ?  ?lisinopril-hydrochlorothiazide 20-12.5 MG tablet ?Commonly known as: Zestoretic ?Take 1 tablet by mouth daily. ?Replaces: lisinopril-hydrochlorothiazide 10-12.5 MG tablet ?  ?omeprazole 20 MG tablet ?Commonly known as: PriLOSEC OTC ?Take 1 tablet (20 mg total) by mouth daily. ?  ?oxyCODONE 5 MG immediate release tablet ?Commonly known as: Oxy IR/ROXICODONE ?Take 1 tablet (5 mg total) by mouth every 6 (six) hours as needed for up to 3 days for severe pain. ?  ?potassium chloride 10 MEQ tablet ?Commonly known as: KLOR-CON M ?Take 1 tablet (10 mEq total) by mouth daily. ?Start taking on: June 04, 2021 ?  ? ?  ? ? ?Discharge Assessment: ?Vitals:  ? 06/03/21 0429 06/03/21 0949  ?BP: (!) 163/116 (!) 176/93  ?Pulse: 67 76  ?Resp: 18   ?Temp: 98.7 ?F (37.1 ?C)   ?SpO2: 100%    ? Skin clean, dry and intact without evidence of skin break down, no evidence of skin tears noted. ?IV catheter discontinued intact. Site without signs and symptoms of complications - no redness or edema noted at insertion site, patient denies c/o pain - only slight tenderness at site.  Dressing with slight pressure applied. ? ?D/c Instructions-Education: ?Discharge instructions given to patient/family with verbalized understanding. ?D/c education completed with patient/family including follow up instructions, medication list, d/c activities limitations if indicated, with other d/c instructions as indicated by MD - patient able to verbalize understanding, all questions fully answered. ?Patient instructed to return to ED, call 911, or call MD for any changes in condition.  ?Patient escorted via WC, and D/C home via private auto. ? ?Verl Dicker, RN ?06/03/2021 1:29 PM  ?

## 2021-06-06 ENCOUNTER — Other Ambulatory Visit (INDEPENDENT_AMBULATORY_CARE_PROVIDER_SITE_OTHER): Payer: Self-pay

## 2021-06-06 DIAGNOSIS — K863 Pseudocyst of pancreas: Secondary | ICD-10-CM

## 2021-06-06 DIAGNOSIS — N2889 Other specified disorders of kidney and ureter: Secondary | ICD-10-CM

## 2021-06-06 NOTE — Telephone Encounter (Signed)
Referral placed in Epic for [facility], they will contact patient ?

## 2021-06-09 ENCOUNTER — Encounter (INDEPENDENT_AMBULATORY_CARE_PROVIDER_SITE_OTHER): Payer: Self-pay | Admitting: Gastroenterology

## 2021-06-09 ENCOUNTER — Ambulatory Visit (INDEPENDENT_AMBULATORY_CARE_PROVIDER_SITE_OTHER): Payer: Medicaid Other | Admitting: Gastroenterology

## 2021-06-09 VITALS — BP 108/73 | HR 87 | Temp 98.6°F | Ht 66.5 in | Wt 234.6 lb

## 2021-06-09 DIAGNOSIS — K8000 Calculus of gallbladder with acute cholecystitis without obstruction: Secondary | ICD-10-CM | POA: Diagnosis not present

## 2021-06-09 DIAGNOSIS — K859 Acute pancreatitis without necrosis or infection, unspecified: Secondary | ICD-10-CM

## 2021-06-09 DIAGNOSIS — K863 Pseudocyst of pancreas: Secondary | ICD-10-CM | POA: Diagnosis not present

## 2021-06-09 DIAGNOSIS — F17210 Nicotine dependence, cigarettes, uncomplicated: Secondary | ICD-10-CM

## 2021-06-09 DIAGNOSIS — F1010 Alcohol abuse, uncomplicated: Secondary | ICD-10-CM | POA: Diagnosis not present

## 2021-06-09 NOTE — Progress Notes (Signed)
Katrinka Blazing, M.D. ?Gastroenterology & Hepatology ?[facility] ?[address] ?[place], [place] [address] ? ?Primary Care Physician: ?[facility] ?[address] ?[place] [address] ? ?I will communicate my assessment and recommendations to the referring MD via EMR. ? ?Problems: ?Acute pancreatitis due to alcohol and possible gallstones ? ?History of Present Illness: ?Kimberly Patrick is a 36 y.o. female with past medical history of acute pancreatitis due to alcohol possible gallstones, depression and hypertension, who presents for follow up of acute pancreatitis. ? ?The patient was last seen on 06/03/2021. At that time, the patient was seen in consultation for an episode of acute pancreatitis in the setting of chronic alcohol intake.  There was evidence of gallstones on abdominal ultrasound.  She was found to have inflammatory changes in the pancreas on CT scan but there was also presence of focal fluid collection measuring up to 3.7 cm in the midportion of the pancreas.  Patient received supportive treatment with pain control and IV fluids, was able to advance her diet and was discharged on oxycodone for pain control.  She was advised to avoid alcohol. ? ?Patient states she is still having pain and discomfort in her abdomen, but slightly better than in the past. Has not been eating too much as she feels full easily. Stools are soft, which are similar to prior to her episode of pancreatitis. She is having 3 Bms per day. No melena or hematochezia recently. Has felt some bloating. The patient denies having any nausea, vomiting, fever, chills, hematochezia, melena, hematemesis, jaundice, pruritus or weight loss. She has been taking oxycodone every 6 hours since discharge from the hospital. Has not taken Tylenol . ? ?Has not drank any alcohol since she left the hospital. ? ?Notably, the patient has CT abdomen and pelvis without  contrast scheduled on 08/02/2021. ? ?Last EGD: never ?Last Colonoscopy: never ? ?Cousin had breast cancer ? ?Past Medical History: ?Past Medical History:  ?Diagnosis Date  ? Depression   ? Gallstones and inflammation of gallbladder without obstruction 03/28/2012  ? Heart palpitations   ? Hypertension   ? ? ?Past Surgical History:History reviewed. No pertinent surgical history. ? ?Family History: ?Family History  ?Problem Relation Age of Onset  ? Coronary artery disease Mother   ? Hypertension Mother   ? Breast cancer Cousin   ? Diabetes Maternal Aunt   ? Stroke Maternal Aunt   ? ? ?Social History: ?Social History  ? ?Tobacco Use  ?Smoking Status Every Day  ? Packs/day: 0.50  ? Types: Cigarettes  ?Smokeless Tobacco Never  ? ?Social History  ? ?Substance and Sexual Activity  ?Alcohol Use Yes  ? Comment: occ. use. no alcohol since preg.  ? ?Social History  ? ?Substance and Sexual Activity  ?Drug Use Yes  ? Types: Marijuana  ? Comment: occasionally  ? ? ?Allergies: ?Allergies  ?Allergen Reactions  ? Gadopentetate Hives, Other (See Comments) and Swelling  ?  Sneezing, coughing, hives, lip swelling  ? Ivp Dye [Iodinated Contrast Media]   ? ? ?Medications: ?Current Outpatient Medications  ?Medication Sig Dispense Refill  ? atenolol (TENORMIN) 50 MG tablet Take 1 tablet (50 mg total) by mouth daily. For BP 30 tablet 11  ? hydrALAZINE (APRESOLINE) 25 MG tablet Take 1 tablet (25 mg total) by mouth every 8 (eight) hours. 90 tablet 1  ? lisinopril-hydrochlorothiazide (ZESTORETIC) 20-12.5 MG tablet Take 1 tablet by mouth daily. 30 tablet 1  ? oxycodone (  OXY-IR) 5 MG capsule Take 5 mg by mouth every 6 (six) hours as needed.    ? potassium chloride (KLOR-CON M) 10 MEQ tablet Take 1 tablet (10 mEq total) by mouth daily. 30 tablet 1  ? ?No current facility-administered medications for this visit.  ? ? ?Review of Systems: ?GENERAL: negative for malaise, night sweats ?HEENT: No changes in hearing or vision, no nose bleeds or other  nasal problems. ?NECK: Negative for lumps, goiter, pain and significant neck swelling ?RESPIRATORY: Negative for cough, wheezing ?CARDIOVASCULAR: Negative for chest pain, leg swelling, palpitations, orthopnea ?GI: SEE HPI ?MUSCULOSKELETAL: Negative for joint pain or swelling, back pain, and muscle pain. ?SKIN: Negative for lesions, rash ?PSYCH: Negative for sleep disturbance, mood disorder and recent psychosocial stressors. ?HEMATOLOGY Negative for prolonged bleeding, bruising easily, and swollen nodes. ?ENDOCRINE: Negative for cold or heat intolerance, polyuria, polydipsia and goiter. ?NEURO: negative for tremor, gait imbalance, syncope and seizures. ?The remainder of the review of systems is noncontributory. ? ? ?Physical Exam: ?BP 108/73 (BP Location: Left Arm, Patient Position: Sitting, Cuff Size: Large)   Pulse 87   Temp 98.6 ?F (37 ?C) (Oral)   Ht 5' 6.5" (1.689 m)   Wt 234 lb 9.6 oz (106.4 kg)   BMI 37.30 kg/m?  ?GENERAL: The patient is AO x3, in no acute distress. ?HEENT: Head is normocephalic and atraumatic. EOMI are intact. Mouth is well hydrated and without lesions. ?NECK: Supple. No masses ?LUNGS: Clear to auscultation. No presence of rhonchi/wheezing/rales. Adequate chest expansion ?HEART: RRR, normal s1 and s2. ?ABDOMEN: tender to palpation in the LUQ, no guarding, no peritoneal signs, and nondistended. BS +. No masses. ?EXTREMITIES: Without any cyanosis, clubbing, rash, lesions or edema. ?NEUROLOGIC: AOx3, no focal motor deficit. ?SKIN: no jaundice, no rashes ? ?Imaging/Labs: ?as above ? ?I personally reviewed and interpreted the available labs, imaging and endoscopic files. ? ?Impression and Plan: ?Kimberly Patrick is a 36 y.o. female with past medical history of acute pancreatitis due to alcohol possible gallstones, depression and hypertension, who presents for follow up of acute pancreatitis.  The patient presented with an acute episode of acute pancreatitis that improved with conservative  measures.  Notably, she was found to have an intrapancreatic fluid collection due to the severity of her inflammation.  I had a thorough discussion with her regarding the fact we will need to further characterize the fluid collection 6 weeks after her initial episode.  She is scheduled for a noncontrast study in May, we will reach the radiology department to change the study for a CT pancreas protocol with IV contrast study and cancel the noncontrast study.  For now, she can continue advancing her diet and take medication for pain control, ideally she should stick with Tylenol if pain is mild which she understood.  I emphasized the importance of alcohol cessation but she will also need to follow-up with general surgery next week regarding possible cholecystectomy given the fact that her episode of acute pancreatitis could also be related to gallstones.  Finally, I emphasized the importance of smoking cessation given her pancreatic disease.  Patient understood and agreed. ? ?- Can take Tylenol every 6 hours 1 g for pain control ?- Minimize the need for opiate use unless strictly necessary ?- Proceed with scheduled CT abdomen on 5/30 -we will reach radiology to change order for a CT pancreas protocol ?- Smoking cessation ?- Follow with general surgery regarding cholecystectomy ?- Patient was counseled regarding the importance of alcohol cessation. The patient was  informed about the long term effects of continuous alcohol intake. ? ?All questions were answered.     ? ?Dolores Frame, MD ?Gastroenterology and Hepatology ?[facility] ? ?

## 2021-06-09 NOTE — Patient Instructions (Addendum)
Can take Tylenol every 6 hours 1 g for pain control ?Minimize the need for opiate use unless strictly necessary ?Proceed with scheduled CT abdomen on 5/30 ?Smoking cessation ?Follow with general surgery regarding cholecystectomy ?Patient was counseled regarding the importance of alcohol cessation. The patient was informed about the long term effects of continuous alcohol intake. ? ? ?

## 2021-06-14 ENCOUNTER — Telehealth (INDEPENDENT_AMBULATORY_CARE_PROVIDER_SITE_OTHER): Payer: Self-pay

## 2021-06-14 ENCOUNTER — Ambulatory Visit (INDEPENDENT_AMBULATORY_CARE_PROVIDER_SITE_OTHER): Payer: Medicaid Other | Admitting: Surgery

## 2021-06-14 ENCOUNTER — Other Ambulatory Visit: Payer: Self-pay

## 2021-06-14 ENCOUNTER — Encounter: Payer: Self-pay | Admitting: Surgery

## 2021-06-14 VITALS — BP 147/88 | HR 74 | Temp 97.6°F | Resp 16 | Ht 66.5 in | Wt 236.0 lb

## 2021-06-14 DIAGNOSIS — K859 Acute pancreatitis without necrosis or infection, unspecified: Secondary | ICD-10-CM

## 2021-06-14 DIAGNOSIS — K8020 Calculus of gallbladder without cholecystitis without obstruction: Secondary | ICD-10-CM | POA: Diagnosis not present

## 2021-06-14 MED ORDER — METHYLPREDNISOLONE 32 MG PO TABS: 32.0000 mg | ORAL_TABLET | Freq: Every day | ORAL | 0 refills | Status: DC

## 2021-06-14 MED ORDER — DIPHENHYDRAMINE HCL 50 MG PO TABS: 50.0000 mg | ORAL_TABLET | Freq: Once | ORAL | 0 refills | Status: DC

## 2021-06-14 NOTE — Telephone Encounter (Signed)
Thanks

## 2021-06-14 NOTE — Telephone Encounter (Signed)
Bulah Lurie Ann Gilverto Dileonardo, CMA  ?

## 2021-06-14 NOTE — Progress Notes (Signed)
[facility] History and Physical ? ?Reason for Referral: Cholelithiasis, possible gallstone pancreatitis ?Referring Physician: Dr. Castaneda ? ?Chief Complaint   ?New Patient (Initial Visit) ?  ? ? ?Kimberly Patrick is a 36 y.o. female.  ?HPI: Patient presents for evaluation of gallstones.  She was recently admitted to the hospital for pancreatitis with discharge on 3/31.  At that time she is complaining of back and abdominal pain.  Her pancreatitis was thought to be secondary to alcohol, though gallstones could not be ruled out given they were noted on imaging.  Since she has been discharged from the hospital she has been eating better with decreased greasy food intake.  She states that she has had previous abdominal pain that she thought was related to her gallstones.  She states that she had a previous admission last year for gallstones, but at that time was not told she needed to have her gallbladder removed.  She denies nausea and vomiting.  Her last bowel movement was this morning.  She denies any history of any other abdominal surgeries.  Her past medical history significant for hypertension.  She denies use of blood thinning medications.  She smokes half a pack per day, occasionally drinks alcohol on the weekends, and uses marijuana daily. ? ?Past Medical History:  ?Diagnosis Date  ? Depression   ? Gallstones and inflammation of gallbladder without obstruction 03/28/2012  ? Heart palpitations   ? Hypertension   ? ? ?No past surgical history on file. ? ?Family History  ?Problem Relation Age of Onset  ? Coronary artery disease Mother   ? Hypertension Mother   ? Breast cancer Cousin   ? Diabetes Maternal Aunt   ? Stroke Maternal Aunt   ? ? ?Social History  ? ?Tobacco Use  ? Smoking status: Every Day  ?  Packs/day: 0.50  ?  Types: Cigarettes  ? Smokeless tobacco: Never  ?Vaping Use  ? Vaping Use: Never used  ?Substance Use Topics  ? Alcohol use: Yes  ?  Comment: occ. use. no alcohol since  preg.  ? Drug use: Yes  ?  Types: Marijuana  ?  Comment: occasionally  ? ? ?Medications: I have reviewed the patient's current medications. ?Allergies as of 06/14/2021   ? ?   Reactions  ? Gadopentetate Hives, Other (See Comments), Swelling  ? Sneezing, coughing, hives, lip swelling  ? Ivp Dye [iodinated Contrast Media]   ? ?  ? ?  ?Medication List  ?  ? ?  ? Accurate as of June 14, 2021  9:45 AM. If you have any questions, ask your nurse or doctor.  ?  ?  ? ?  ? ?atenolol 50 MG tablet ?Commonly known as: Tenormin ?Take 1 tablet (50 mg total) by mouth daily. For BP ?  ?hydrALAZINE 25 MG tablet ?Commonly known as: APRESOLINE ?Take 1 tablet (25 mg total) by mouth every 8 (eight) hours. ?  ?lisinopril-hydrochlorothiazide 20-12.5 MG tablet ?Commonly known as: Zestoretic ?Take 1 tablet by mouth daily. ?  ?oxycodone 5 MG capsule ?Commonly known as: OXY-IR ?Take 5 mg by mouth every 6 (six) hours as needed. ?  ?potassium chloride 10 MEQ tablet ?Commonly known as: KLOR-CON M ?Take 1 tablet (10 mEq total) by mouth daily. ?  ? ?  ? ? ? ?ROS:  ?Constitutional: negative for chills, fatigue, and fevers ?Eyes: negative for visual disturbance and pain ?Ears, nose, mouth, throat, and face: positive for sinus problems, negative for ear drainage and sore throat ?Respiratory:   positive for cough, negative for wheezing and shortness of breath ?Cardiovascular: negative for chest pain and palpitations ?Gastrointestinal: positive for abdominal pain and reflux symptoms, negative for nausea and vomiting ?Genitourinary:negative for dysuria, frequency, and urinary retention ?Integument/breast: negative for dryness and rash ?Hematologic/lymphatic: negative for bleeding and lymphadenopathy ?Musculoskeletal:positive for back pain and joint pain, negative for neck pain ?Neurological: negative for dizziness, tremors, and numbness ?Endocrine: negative for temperature intolerance ? ?Blood pressure (!) 147/88, pulse 74, temperature 97.6 ?F (36.4 ?C),  temperature source Other (Comment), resp. rate 16, height 5' 6.5" (1.689 m), weight 236 lb (107 kg), SpO2 97 %. ?Physical Exam ?Vitals reviewed.  ?Constitutional:   ?   Appearance: Normal appearance.  ?HENT:  ?   Head: Normocephalic and atraumatic.  ?Eyes:  ?   Extraocular Movements: Extraocular movements intact.  ?   Pupils: Pupils are equal, round, and reactive to light.  ?Cardiovascular:  ?   Rate and Rhythm: Normal rate and regular rhythm.  ?Pulmonary:  ?   Effort: Pulmonary effort is normal.  ?   Breath sounds: Normal breath sounds.  ?Abdominal:  ?   Comments: Abdomen soft, nondistended, no percussion tenderness, mild epigastric tenderness to palpation, no rigidity, guarding, or rebound tenderness; negative Murphy sign  ?Musculoskeletal:     ?   General: Normal range of motion.  ?   Cervical back: Normal range of motion.  ?Skin: ?   General: Skin is warm and dry.  ?Neurological:  ?   General: No focal deficit present.  ?   Mental Status: She is alert and oriented to person, place, and time.  ?Psychiatric:     ?   Mood and Affect: Mood normal.     ?   Behavior: Behavior normal.  ? ? ?Results: ?No results found for this or any previous visit (from the past 48 hour(s)). ? ?No results found. ? ? ?Assessment & Plan:  ?Kimberly Patrick is a 36 y.o. female who presents for evaluation of cholelithiasis with possible gallstone pancreatitis. ? ?-I discussed the pathophysiology of cholelithiasis and gallstone pancreatitis with the patient.  I further explained that cholecystectomy would likely be in her best interest to prevent any future episodes of pancreatitis secondary to gallstone disease. ?-I counseled the patient about the indication, risks and benefits of laparoscopic cholecystectomy.  She understands there is a very small chance for bleeding, infection, injury to normal structures (including common bile duct), conversion to open surgery, persistent symptoms, evolution of postcholecystectomy diarrhea, need for  secondary interventions, anesthesia reaction, cardiopulmonary issues and other risks not specifically detailed here. I described the expected recovery, the plan for follow-up and the restrictions during the recovery phase.  All questions were answered. ?-Patient tentatively scheduled for surgery on 4/26 ?-Information regarding cholelithiasis was provided to the patient ? ?All questions were answered to the satisfaction of the patient and family. ? ?Louie Meaders, DO ?[facility] ?[address] ?[place], [place] [address] ?[phone] (office) ?

## 2021-06-14 NOTE — Telephone Encounter (Signed)
Ct has been changed to With Contrast and Prednisone has been called in to her pharmacy ?

## 2021-06-16 NOTE — H&P (Signed)
[facility] History and Physical ? ?Reason for Referral: Cholelithiasis, possible gallstone pancreatitis ?Referring Physician: Dr. Jenetta Downer ? ?Chief Complaint   ?New Patient (Initial Visit) ?  ? ? ?Kimberly Patrick is a 36 y.o. female.  ?HPI: Patient presents for evaluation of gallstones.  She was recently admitted to the hospital for pancreatitis with discharge on 3/31.  At that time she is complaining of back and abdominal pain.  Her pancreatitis was thought to be secondary to alcohol, though gallstones could not be ruled out given they were noted on imaging.  Since she has been discharged from the hospital she has been eating better with decreased greasy food intake.  She states that she has had previous abdominal pain that she thought was related to her gallstones.  She states that she had a previous admission last year for gallstones, but at that time was not told she needed to have her gallbladder removed.  She denies nausea and vomiting.  Her last bowel movement was this morning.  She denies any history of any other abdominal surgeries.  Her past medical history significant for hypertension.  She denies use of blood thinning medications.  She smokes half a pack per day, occasionally drinks alcohol on the weekends, and uses marijuana daily. ? ?Past Medical History:  ?Diagnosis Date  ? Depression   ? Gallstones and inflammation of gallbladder without obstruction 03/28/2012  ? Heart palpitations   ? Hypertension   ? ? ?No past surgical history on file. ? ?Family History  ?Problem Relation Age of Onset  ? Coronary artery disease Mother   ? Hypertension Mother   ? Breast cancer Cousin   ? Diabetes Maternal Aunt   ? Stroke Maternal Aunt   ? ? ?Social History  ? ?Tobacco Use  ? Smoking status: Every Day  ?  Packs/day: 0.50  ?  Types: Cigarettes  ? Smokeless tobacco: Never  ?Vaping Use  ? Vaping Use: Never used  ?Substance Use Topics  ? Alcohol use: Yes  ?  Comment: occ. use. no alcohol since  preg.  ? Drug use: Yes  ?  Types: Marijuana  ?  Comment: occasionally  ? ? ?Medications: I have reviewed the patient's current medications. ?Allergies as of 06/14/2021   ? ?   Reactions  ? Gadopentetate Hives, Other (See Comments), Swelling  ? Sneezing, coughing, hives, lip swelling  ? Ivp Dye [iodinated Contrast Media]   ? ?  ? ?  ?Medication List  ?  ? ?  ? Accurate as of June 14, 2021  9:45 AM. If you have any questions, ask your nurse or doctor.  ?  ?  ? ?  ? ?atenolol 50 MG tablet ?Commonly known as: Tenormin ?Take 1 tablet (50 mg total) by mouth daily. For BP ?  ?hydrALAZINE 25 MG tablet ?Commonly known as: APRESOLINE ?Take 1 tablet (25 mg total) by mouth every 8 (eight) hours. ?  ?lisinopril-hydrochlorothiazide 20-12.5 MG tablet ?Commonly known as: Zestoretic ?Take 1 tablet by mouth daily. ?  ?oxycodone 5 MG capsule ?Commonly known as: OXY-IR ?Take 5 mg by mouth every 6 (six) hours as needed. ?  ?potassium chloride 10 MEQ tablet ?Commonly known as: KLOR-CON M ?Take 1 tablet (10 mEq total) by mouth daily. ?  ? ?  ? ? ? ?ROS:  ?Constitutional: negative for chills, fatigue, and fevers ?Eyes: negative for visual disturbance and pain ?Ears, nose, mouth, throat, and face: positive for sinus problems, negative for ear drainage and sore throat ?Respiratory:  positive for cough, negative for wheezing and shortness of breath ?Cardiovascular: negative for chest pain and palpitations ?Gastrointestinal: positive for abdominal pain and reflux symptoms, negative for nausea and vomiting ?Genitourinary:negative for dysuria, frequency, and urinary retention ?Integument/breast: negative for dryness and rash ?Hematologic/lymphatic: negative for bleeding and lymphadenopathy ?Musculoskeletal:positive for back pain and joint pain, negative for neck pain ?Neurological: negative for dizziness, tremors, and numbness ?Endocrine: negative for temperature intolerance ? ?Blood pressure (!) 147/88, pulse 74, temperature 97.6 ?F (36.4 ?C),  temperature source Other (Comment), resp. rate 16, height 5' 6.5" (1.689 m), weight 236 lb (107 kg), SpO2 97 %. ?Physical Exam ?Vitals reviewed.  ?Constitutional:   ?   Appearance: Normal appearance.  ?HENT:  ?   Head: Normocephalic and atraumatic.  ?Eyes:  ?   Extraocular Movements: Extraocular movements intact.  ?   Pupils: Pupils are equal, round, and reactive to light.  ?Cardiovascular:  ?   Rate and Rhythm: Normal rate and regular rhythm.  ?Pulmonary:  ?   Effort: Pulmonary effort is normal.  ?   Breath sounds: Normal breath sounds.  ?Abdominal:  ?   Comments: Abdomen soft, nondistended, no percussion tenderness, mild epigastric tenderness to palpation, no rigidity, guarding, or rebound tenderness; negative Murphy sign  ?Musculoskeletal:     ?   General: Normal range of motion.  ?   Cervical back: Normal range of motion.  ?Skin: ?   General: Skin is warm and dry.  ?Neurological:  ?   General: No focal deficit present.  ?   Mental Status: She is alert and oriented to person, place, and time.  ?Psychiatric:     ?   Mood and Affect: Mood normal.     ?   Behavior: Behavior normal.  ? ? ?Results: ?No results found for this or any previous visit (from the past 48 hour(s)). ? ?No results found. ? ? ?Assessment & Plan:  ?Kimberly Patrick is a 36 y.o. female who presents for evaluation of cholelithiasis with possible gallstone pancreatitis. ? ?-I discussed the pathophysiology of cholelithiasis and gallstone pancreatitis with the patient.  I further explained that cholecystectomy would likely be in her best interest to prevent any future episodes of pancreatitis secondary to gallstone disease. ?-I counseled the patient about the indication, risks and benefits of laparoscopic cholecystectomy.  She understands there is a very small chance for bleeding, infection, injury to normal structures (including common bile duct), conversion to open surgery, persistent symptoms, evolution of postcholecystectomy diarrhea, need for  secondary interventions, anesthesia reaction, cardiopulmonary issues and other risks not specifically detailed here. I described the expected recovery, the plan for follow-up and the restrictions during the recovery phase.  All questions were answered. ?-Patient tentatively scheduled for surgery on 4/26 ?-Information regarding cholelithiasis was provided to the patient ? ?All questions were answered to the satisfaction of the patient and family. ? ?Graciella Freer, DO ?[facility] ?[place], [place] [address] ?[phone] (office) ?

## 2021-06-24 NOTE — Patient Instructions (Signed)
? ? ? ? ? ? ? ? Kimberly Patrick ? 06/24/2021  ?  ? @PREFPERIOPPHARMACY @ ? ? Your procedure is scheduled on  06/29/2021. ? ? Report to [facility] at  0700  A.M. ? ? Call this number if you have problems the morning of surgery: ? [phone] ? ? Remember: ? Do not eat or drink after midnight. ?  ?  ? Take these medicines the morning of surgery with A SIP OF WATER  ? ?                   atenolol, prilosec, oxy IR (if needed). ?  ? Do not wear jewelry, make-up or nail polish. ? Do not wear lotions, powders, or perfumes, or deodorant. ? Do not shave 48 hours prior to surgery.  Men may shave face and neck. ? Do not bring valuables to the hospital. ? [facility] is not responsible for any belongings or valuables. ? ?Contacts, dentures or bridgework may not be worn into surgery.  Leave your suitcase in the car.  After surgery it may be brought to your room. ? ?For patients admitted to the hospital, discharge time will be determined by your treatment team. ? ?Patients discharged the day of surgery will not be allowed to drive home and must have someone with them for 24 hours.  ? ? ?Special instructions:   DO NOT smoke tobacco or vape for 24 hours before your procedure. ? ?Please read over the following fact sheets that you were given. ?Anesthesia Post-op Instructions and Care and Recovery After Surgery ?  ? ? ? Minimally Invasive Cholecystectomy, Care After ?The following information offers guidance on how to care for yourself after your procedure. Your health care provider may also give you more specific instructions. If you have problems or questions, contact your health care provider. ?What can I expect after the procedure? ?After the procedure, it is common to have: ?Pain at your incision sites. You will be given medicines to control this pain. ?Mild nausea or vomiting. ?Bloating and possible shoulder pain from the gas that was used during the procedure. ?Follow these instructions at home: ?Medicines ?Take  over-the-counter and prescription medicines only as told by your health care provider. ?If you were prescribed an antibiotic medicine, take it as told by your health care provider. Do not stop using the antibiotic even if you start to feel better. ?Ask your health care provider if the medicine prescribed to you: ?Requires you to avoid driving or using machinery. ?Can cause constipation. You may need to take these actions to prevent or treat constipation: ?Drink enough fluid to keep your urine pale yellow. ?Take over-the-counter or prescription medicines. ?Eat foods that are high in fiber, such as beans, whole grains, and fresh fruits and vegetables. ?Limit foods that are high in fat and processed sugars, such as fried or sweet foods. ?Incision care ? ?Follow instructions from your health care provider about how to take care of your incisions. Make sure you: ?Wash your hands with soap and water for at least 20 seconds before and after you change your bandage (dressing). If soap and water are not available, use hand sanitizer. ?Change your dressing as told by your health care provider. ?Leave stitches (sutures), skin glue, or adhesive strips in place. These skin closures may need to be in place for 2 weeks or longer. If adhesive strip edges start to loosen and curl up, you may trim the loose edges. Do not remove adhesive strips  completely unless your health care provider tells you to do that. ?Do not take baths, swim, or use a hot tub until your health care provider approves. Ask your health care provider if you may take showers. You may only be allowed to take sponge baths. ?Check your incision area every day for signs of infection. Check for: ?More redness, swelling, or pain. ?Fluid or Patrick. ?Warmth. ?Pus or a bad smell. ?Activity ?Rest as told by your health care provider. Do not do activities that require a lot of effort. ?Avoid sitting for a long time without moving. Get up to take short walks every 1-2 hours.  This is important to improve Patrick flow and breathing. Ask for help if you feel weak or unsteady. ?Do not lift anything that is heavier than 10 lb (4.5 kg), or the limit that you are told, until your health care provider says that it is safe. ?Do not play contact sports until your health care provider approves. ?Do not return to work or school until your health care provider approves. ?Return to your normal activities as told by your health care provider. Ask your health care provider what activities are safe for you. ?General instructions ?If you were given a sedative during the procedure, it can affect you for several hours. Do not drive or operate machinery until your health care provider says that it is safe. ?Keep all follow-up visits. This is important. ?Contact a health care provider if: ?You develop a rash. ?You have more redness, swelling, or pain around your incisions. ?You have fluid or Patrick coming from your incisions. ?Your incisions feel warm to the touch. ?You have pus or a bad smell coming from your incisions. ?You have a fever. ?One or more of your incisions breaks open. ?Get help right away if: ?You have trouble breathing. ?You have chest pain. ?You have more pain in your shoulders. ?You faint or feel dizzy when you stand. ?You have severe pain in your abdomen. ?You have nausea or vomiting that lasts for more than one day. ?You have leg pain that is new or unusual, or if it is localized to one specific spot. ?These symptoms may represent a serious problem that is an emergency. Do not wait to see if the symptoms will go away. Get medical help right away. Call your local emergency services (911 in the [place] Do not drive yourself to the hospital. ?Summary ?After your procedure, it is common to have pain at the incision sites. You may also have nausea or bloating. ?Follow your health care provider's instructions about medicine, activity restrictions, and caring for your incision areas. Do not do  activities that require a lot of effort. ?Contact a health care provider if you have a fever or other signs of infection, such as more redness, swelling, or pain around the incisions. ?Get help right away if you have chest pain, increasing pain in the shoulders, or trouble breathing. ?This information is not intended to replace advice given to you by your health care provider. Make sure you discuss any questions you have with your health care provider. ?Document Revised: 08/24/2020 Document Reviewed: 08/24/2020 ?Elsevier Patient Education ? [place]. ?General Anesthesia, Adult, Care After ?This sheet gives you information about how to care for yourself after your procedure. Your health care provider may also give you more specific instructions. If you have problems or questions, contact your health care provider. ?What can I expect after the procedure? ?After the procedure, the following side effects are common: ?  Pain or discomfort at the IV site. ?Nausea. ?Vomiting. ?Sore throat. ?Trouble concentrating. ?Feeling cold or chills. ?Feeling weak or tired. ?Sleepiness and fatigue. ?Soreness and body aches. These side effects can affect parts of the body that were not involved in surgery. ?Follow these instructions at home: ?For the time period you were told by your health care provider: ? ?Rest. ?Do not participate in activities where you could fall or become injured. ?Do not drive or use machinery. ?Do not drink alcohol. ?Do not take sleeping pills or medicines that cause drowsiness. ?Do not make important decisions or sign legal documents. ?Do not take care of children on your own. ?Eating and drinking ?Follow any instructions from your health care provider about eating or drinking restrictions. ?When you feel hungry, start by eating small amounts of foods that are soft and easy to digest (bland), such as toast. Gradually return to your regular diet. ?Drink enough fluid to keep your urine pale yellow. ?If you  vomit, rehydrate by drinking water, juice, or clear broth. ?General instructions ?If you have sleep apnea, surgery and certain medicines can increase your risk for breathing problems. Follow instructions from your h

## 2021-06-27 ENCOUNTER — Encounter (HOSPITAL_COMMUNITY)
Admission: RE | Admit: 2021-06-27 | Discharge: 2021-06-27 | Disposition: A | Discharge status: Home / Self Care | Payer: Medicaid Other | Source: Ambulatory Visit | Attending: Surgery | Admitting: Surgery

## 2021-06-27 DIAGNOSIS — Z01812 Encounter for preprocedural laboratory examination: Secondary | ICD-10-CM | POA: Insufficient documentation

## 2021-06-27 DIAGNOSIS — E876 Hypokalemia: Secondary | ICD-10-CM

## 2021-06-27 DIAGNOSIS — Z01818 Encounter for other preprocedural examination: Secondary | ICD-10-CM

## 2021-06-27 LAB — BASIC METABOLIC PANEL
Anion gap: 7 (ref 5–15)
BUN: 11 mg/dL (ref 6–20)
CO2: 27 mmol/L (ref 22–32)
Calcium: 8.8 mg/dL — ABNORMAL LOW (ref 8.9–10.3)
Chloride: 104 mmol/L (ref 98–111)
Creatinine, Ser: 0.86 mg/dL (ref 0.44–1.00)
GFR, Estimated: >60 mL/min (ref >60)
Glucose, Bld: 114 mg/dL — ABNORMAL HIGH (ref 70–99)
Potassium: 3.5 mmol/L (ref 3.5–5.1)
Sodium: 138 mmol/L (ref 135–145)

## 2021-06-27 LAB — HCG, SERUM, QUALITATIVE: Preg, Serum: NEGATIVE

## 2021-06-29 ENCOUNTER — Encounter (HOSPITAL_COMMUNITY): Payer: Self-pay | Admitting: Surgery

## 2021-06-29 ENCOUNTER — Encounter (HOSPITAL_COMMUNITY)
Admission: RE | Disposition: A | Discharge status: Home / Self Care | Payer: Self-pay | Source: Home / Self Care | Attending: Surgery

## 2021-06-29 ENCOUNTER — Ambulatory Visit (HOSPITAL_COMMUNITY)
Admission: RE | Admit: 2021-06-29 | Discharge: 2021-06-29 | Disposition: A | Discharge status: Home / Self Care | Payer: Medicaid Other | Attending: Surgery | Admitting: Surgery

## 2021-06-29 ENCOUNTER — Ambulatory Visit (HOSPITAL_BASED_OUTPATIENT_CLINIC_OR_DEPARTMENT_OTHER): Payer: Medicaid Other | Admitting: Anesthesiology

## 2021-06-29 ENCOUNTER — Ambulatory Visit (HOSPITAL_COMMUNITY): Payer: Medicaid Other | Admitting: Anesthesiology

## 2021-06-29 DIAGNOSIS — K8010 Calculus of gallbladder with chronic cholecystitis without obstruction: Secondary | ICD-10-CM | POA: Insufficient documentation

## 2021-06-29 DIAGNOSIS — K8020 Calculus of gallbladder without cholecystitis without obstruction: Secondary | ICD-10-CM

## 2021-06-29 DIAGNOSIS — I10 Essential (primary) hypertension: Secondary | ICD-10-CM | POA: Insufficient documentation

## 2021-06-29 DIAGNOSIS — F1290 Cannabis use, unspecified, uncomplicated: Secondary | ICD-10-CM | POA: Insufficient documentation

## 2021-06-29 DIAGNOSIS — K219 Gastro-esophageal reflux disease without esophagitis: Secondary | ICD-10-CM | POA: Diagnosis not present

## 2021-06-29 DIAGNOSIS — F17210 Nicotine dependence, cigarettes, uncomplicated: Secondary | ICD-10-CM | POA: Diagnosis not present

## 2021-06-29 HISTORY — PX: CHOLECYSTECTOMY: SHX55

## 2021-06-29 SURGERY — LAPAROSCOPIC CHOLECYSTECTOMY
Anesthesia: General

## 2021-06-29 MED ORDER — ONDANSETRON HCL 4 MG/2ML IJ SOLN: 4.0000 mg | Freq: Once | INTRAMUSCULAR | Status: DC | PRN

## 2021-06-29 MED ORDER — OXYCODONE HCL 5 MG PO TABS: 5.0000 mg | ORAL_TABLET | Freq: Four times a day (QID) | ORAL | 0 refills | Status: AC | PRN

## 2021-06-29 MED ORDER — DEXAMETHASONE SODIUM PHOSPHATE 10 MG/ML IJ SOLN
INTRAMUSCULAR | Status: AC
  Filled 2021-06-29: qty 1

## 2021-06-29 MED ORDER — SUGAMMADEX SODIUM 500 MG/5ML IV SOLN
INTRAVENOUS | Status: AC
  Filled 2021-06-29: qty 5

## 2021-06-29 MED ORDER — FENTANYL CITRATE (PF) 100 MCG/2ML IJ SOLN
INTRAMUSCULAR | Status: AC
  Filled 2021-06-29: qty 2

## 2021-06-29 MED ORDER — ACETAMINOPHEN 500 MG PO TABS: 1000.0000 mg | ORAL_TABLET | Freq: Four times a day (QID) | ORAL | 0 refills | Status: AC

## 2021-06-29 MED ORDER — SCOPOLAMINE 1 MG/3DAYS TD PT72
1.0000 | MEDICATED_PATCH | Freq: Once | TRANSDERMAL | Status: DC
  Administered 2021-06-29: 1.5 mg via TRANSDERMAL
  Filled 2021-06-29: qty 1

## 2021-06-29 MED ORDER — DEXMEDETOMIDINE HCL IN NACL 80 MCG/20ML IV SOLN
INTRAVENOUS | Status: AC
  Filled 2021-06-29: qty 20

## 2021-06-29 MED ORDER — ROCURONIUM BROMIDE 10 MG/ML (PF) SYRINGE
PREFILLED_SYRINGE | INTRAVENOUS | Status: DC | PRN
  Administered 2021-06-29: 60 mg via INTRAVENOUS

## 2021-06-29 MED ORDER — SODIUM CHLORIDE 0.9 % IV SOLN
2.0000 g | INTRAVENOUS | Status: AC
  Administered 2021-06-29: 2 g via INTRAVENOUS
  Filled 2021-06-29: qty 2

## 2021-06-29 MED ORDER — BUPIVACAINE HCL (PF) 0.5 % IJ SOLN
INTRAMUSCULAR | Status: AC
  Filled 2021-06-29: qty 30

## 2021-06-29 MED ORDER — SODIUM CHLORIDE 0.9 % IR SOLN
Status: DC | PRN
  Administered 2021-06-29: 1000 mL

## 2021-06-29 MED ORDER — SUGAMMADEX SODIUM 500 MG/5ML IV SOLN
INTRAVENOUS | Status: DC | PRN
Start: 2021-06-29 — End: 2021-06-29
  Administered 2021-06-29: 250 mg via INTRAVENOUS

## 2021-06-29 MED ORDER — HEMOSTATIC AGENTS (NO CHARGE) OPTIME
TOPICAL | Status: DC | PRN
Start: 2021-06-29 — End: 2021-06-29
  Administered 2021-06-29: 1 via TOPICAL

## 2021-06-29 MED ORDER — PROPOFOL 10 MG/ML IV BOLUS
INTRAVENOUS | Status: AC
  Filled 2021-06-29: qty 20

## 2021-06-29 MED ORDER — MIDAZOLAM HCL 2 MG/2ML IJ SOLN
2.0000 mg | Freq: Once | INTRAMUSCULAR | Status: AC
  Administered 2021-06-29: 2 mg via INTRAVENOUS

## 2021-06-29 MED ORDER — CHLORHEXIDINE GLUCONATE 0.12 % MT SOLN
15.0000 mL | Freq: Once | OROMUCOSAL | Status: AC
  Administered 2021-06-29: 15 mL via OROMUCOSAL

## 2021-06-29 MED ORDER — MIDAZOLAM HCL 2 MG/2ML IJ SOLN
INTRAMUSCULAR | Status: AC
  Filled 2021-06-29: qty 2

## 2021-06-29 MED ORDER — HYDROMORPHONE HCL 1 MG/ML IJ SOLN
INTRAMUSCULAR | Status: AC
  Filled 2021-06-29: qty 0.5

## 2021-06-29 MED ORDER — CHLORHEXIDINE GLUCONATE CLOTH 2 % EX PADS: 6.0000 | MEDICATED_PAD | Freq: Once | CUTANEOUS | Status: DC

## 2021-06-29 MED ORDER — DEXMEDETOMIDINE (PRECEDEX) IN NS 20 MCG/5ML (4 MCG/ML) IV SYRINGE
PREFILLED_SYRINGE | INTRAVENOUS | Status: DC | PRN
Start: 2021-06-29 — End: 2021-06-29
  Administered 2021-06-29: 8 ug via INTRAVENOUS
  Administered 2021-06-29: 12 ug via INTRAVENOUS

## 2021-06-29 MED ORDER — LACTATED RINGERS IV SOLN
INTRAVENOUS | Status: DC
  Administered 2021-06-29: 1000 mL via INTRAVENOUS

## 2021-06-29 MED ORDER — BUPIVACAINE HCL (PF) 0.5 % IJ SOLN
INTRAMUSCULAR | Status: DC | PRN
  Administered 2021-06-29: 30 mL

## 2021-06-29 MED ORDER — ONDANSETRON HCL 4 MG/2ML IJ SOLN
INTRAMUSCULAR | Status: DC | PRN
Start: 2021-06-29 — End: 2021-06-29
  Administered 2021-06-29: 4 mg via INTRAVENOUS

## 2021-06-29 MED ORDER — PROPOFOL 10 MG/ML IV BOLUS
INTRAVENOUS | Status: DC | PRN
  Administered 2021-06-29: 200 mg via INTRAVENOUS

## 2021-06-29 MED ORDER — FENTANYL CITRATE (PF) 100 MCG/2ML IJ SOLN
INTRAMUSCULAR | Status: DC | PRN
  Administered 2021-06-29 (×3): 50 ug via INTRAVENOUS
  Administered 2021-06-29: 100 ug via INTRAVENOUS
  Administered 2021-06-29 (×2): 50 ug via INTRAVENOUS

## 2021-06-29 MED ORDER — FENTANYL CITRATE (PF) 250 MCG/5ML IJ SOLN
INTRAMUSCULAR | Status: AC
  Filled 2021-06-29: qty 5

## 2021-06-29 MED ORDER — KETOROLAC TROMETHAMINE 30 MG/ML IJ SOLN
INTRAMUSCULAR | Status: DC | PRN
  Administered 2021-06-29: 30 mg via INTRAVENOUS

## 2021-06-29 MED ORDER — MEPERIDINE HCL 50 MG/ML IJ SOLN: 6.2500 mg | INTRAMUSCULAR | Status: DC | PRN

## 2021-06-29 MED ORDER — HYDROMORPHONE HCL 1 MG/ML IJ SOLN
0.2500 mg | INTRAMUSCULAR | Status: DC | PRN
  Administered 2021-06-29 (×3): 0.5 mg via INTRAVENOUS
  Filled 2021-06-29 (×2): qty 0.5

## 2021-06-29 MED ORDER — LIDOCAINE HCL (PF) 2 % IJ SOLN
INTRAMUSCULAR | Status: AC
  Filled 2021-06-29: qty 10

## 2021-06-29 MED ORDER — ONDANSETRON HCL 4 MG/2ML IJ SOLN
INTRAMUSCULAR | Status: AC
  Filled 2021-06-29: qty 2

## 2021-06-29 MED ORDER — PROPOFOL 10 MG/ML IV BOLUS
INTRAVENOUS | Status: AC
Start: 2021-06-29 — End: ?
  Filled 2021-06-29: qty 20

## 2021-06-29 MED ORDER — DOCUSATE SODIUM 100 MG PO CAPS: 100.0000 mg | ORAL_CAPSULE | Freq: Two times a day (BID) | ORAL | 2 refills | Status: DC

## 2021-06-29 MED ORDER — DEXAMETHASONE SODIUM PHOSPHATE 10 MG/ML IJ SOLN
INTRAMUSCULAR | Status: DC | PRN
  Administered 2021-06-29: 10 mg via INTRAVENOUS

## 2021-06-29 MED ORDER — LIDOCAINE HCL (CARDIAC) PF 100 MG/5ML IV SOSY
PREFILLED_SYRINGE | INTRAVENOUS | Status: DC | PRN
  Administered 2021-06-29: 60 mg via INTRATRACHEAL

## 2021-06-29 MED ORDER — ROCURONIUM BROMIDE 10 MG/ML (PF) SYRINGE
PREFILLED_SYRINGE | INTRAVENOUS | Status: AC
  Filled 2021-06-29: qty 10

## 2021-06-29 MED ORDER — ORAL CARE MOUTH RINSE: 15.0000 mL | Freq: Once | OROMUCOSAL | Status: AC

## 2021-06-29 SURGICAL SUPPLY — 47 items
ADH SKN CLS APL DERMABOND .7 (GAUZE/BANDAGES/DRESSINGS) ×1
APL PRP STRL LF DISP 70% ISPRP (MISCELLANEOUS) ×1
APPLIER CLIP ROT 10 11.4 M/L (STAPLE) ×2
APR CLP MED LRG 11.4X10 (STAPLE) ×1
BAG RETRIEVAL 10 (BASKET) ×1
BLADE SURG 15 STRL LF DISP TIS (BLADE) ×1 IMPLANT
BLADE SURG 15 STRL SS (BLADE) ×2
CHLORAPREP W/TINT 26 (MISCELLANEOUS) ×2 IMPLANT
CLIP APPLIE ROT 10 11.4 M/L (STAPLE) ×1 IMPLANT
CLOTH BEACON ORANGE TIMEOUT ST (SAFETY) ×2 IMPLANT
COVER LIGHT HANDLE STERIS (MISCELLANEOUS) ×4 IMPLANT
CUTTER FLEX LINEAR 45M (STAPLE) ×1 IMPLANT
DECANTER SPIKE VIAL GLASS SM (MISCELLANEOUS) ×2 IMPLANT
DERMABOND ADVANCED (GAUZE/BANDAGES/DRESSINGS) ×1
DERMABOND ADVANCED .7 DNX12 (GAUZE/BANDAGES/DRESSINGS) ×1 IMPLANT
ELECT REM PT RETURN 9FT ADLT (ELECTROSURGICAL) ×2
ELECTRODE REM PT RTRN 9FT ADLT (ELECTROSURGICAL) ×1 IMPLANT
GAUZE 4X4 16PLY ~~LOC~~+RFID DBL (SPONGE) ×1 IMPLANT
GLOVE BIOGEL PI IND STRL 6.5 (GLOVE) ×1 IMPLANT
GLOVE BIOGEL PI IND STRL 7.0 (GLOVE) ×2 IMPLANT
GLOVE BIOGEL PI INDICATOR 6.5 (GLOVE) ×2
GLOVE BIOGEL PI INDICATOR 7.0 (GLOVE) ×4
GLOVE SURG SS PI 6.5 STRL IVOR (GLOVE) ×5 IMPLANT
GOWN STRL REUS W/TWL LRG LVL3 (GOWN DISPOSABLE) ×6 IMPLANT
HEMOSTAT SNOW SURGICEL 2X4 (HEMOSTASIS) ×2 IMPLANT
INST SET LAPROSCOPIC AP (KITS) ×2 IMPLANT
KIT TURNOVER KIT A (KITS) ×2 IMPLANT
MANIFOLD NEPTUNE II (INSTRUMENTS) ×2 IMPLANT
NDL INSUFFLATION 14GA 120MM (NEEDLE) ×1 IMPLANT
NEEDLE INSUFFLATION 14GA 120MM (NEEDLE) ×2 IMPLANT
NS IRRIG 1000ML POUR BTL (IV SOLUTION) ×2 IMPLANT
PACK LAP CHOLE LZT030E (CUSTOM PROCEDURE TRAY) ×2 IMPLANT
PAD ARMBOARD 7.5X6 YLW CONV (MISCELLANEOUS) ×2 IMPLANT
RELOAD STAPLE 45 3.5 BLU ETS (ENDOMECHANICALS) IMPLANT
RELOAD STAPLE TA45 3.5 REG BLU (ENDOMECHANICALS) ×2 IMPLANT
SET BASIN LINEN APH (SET/KITS/TRAYS/PACK) ×2 IMPLANT
SET TUBE SMOKE EVAC HIGH FLOW (TUBING) ×2 IMPLANT
SLEEVE ENDOPATH XCEL 5M (ENDOMECHANICALS) ×2 IMPLANT
SUT MNCRL AB 4-0 PS2 18 (SUTURE) ×4 IMPLANT
SUT VICRYL 0 UR6 27IN ABS (SUTURE) ×2 IMPLANT
SYS BAG RETRIEVAL 10MM (BASKET) ×1
SYSTEM BAG RETRIEVAL 10MM (BASKET) ×1 IMPLANT
TROCAR ENDO BLADELESS 11MM (ENDOMECHANICALS) ×2 IMPLANT
TROCAR XCEL NON-BLD 5MMX100MML (ENDOMECHANICALS) ×2 IMPLANT
TROCAR XCEL UNIV SLVE 11M 100M (ENDOMECHANICALS) ×2 IMPLANT
TUBE CONNECTING 12X1/4 (SUCTIONS) ×2 IMPLANT
WARMER LAPAROSCOPE (MISCELLANEOUS) ×2 IMPLANT

## 2021-06-29 NOTE — Transfer of Care (Signed)
Immediate Anesthesia Transfer of Care Note ? ?Patient: Kimberly Patrick ? ?Procedure(s) Performed: LAPAROSCOPIC CHOLECYSTECTOMY ? ?Patient Location: PACU ? ?Anesthesia Type:General ? ?Level of Consciousness: awake, alert  and oriented ? ?Airway & Oxygen Therapy: Patient Spontanous Breathing ? ?Post-op Assessment: Report given to RN and Post -op Vital signs reviewed and stable ? ?Post vital signs: Reviewed and stable ? ?Last Vitals:  ?Vitals Value Taken Time  ?BP 134/78   ?Temp    ?Pulse 88 06/29/21 0940  ?Resp 28 06/29/21 0940  ?SpO2 91 % 06/29/21 0940  ?Vitals shown include unvalidated device data. ? ?Last Pain:  ?Vitals:  ? 06/29/21 0722  ?TempSrc: Oral  ?PainSc: 0-No pain  ?   ? ?Patients Stated Pain Goal: 7 (06/29/21 7253) ? ?Complications: No notable events documented. ?

## 2021-06-29 NOTE — Anesthesia Procedure Notes (Signed)
Procedure Name: Intubation ?Date/Time: 06/29/2021 8:26 AM ?Performed by: Karna Dupes, CRNA ?Pre-anesthesia Checklist: Patient identified, Emergency Drugs available, Suction available and Patient being monitored ?Patient Re-evaluated:Patient Re-evaluated prior to induction ?Oxygen Delivery Method: Circle system utilized ?Preoxygenation: Pre-oxygenation with 100% oxygen ?Induction Type: IV induction ?Ventilation: Mask ventilation without difficulty ?Laryngoscope Size: Mac and 3 ?Grade View: Grade I ?Tube type: Oral ?Tube size: 7.0 mm ?Number of attempts: 1 ?Airway Equipment and Method: Stylet ?Placement Confirmation: ETT inserted through vocal cords under direct vision, positive ETCO2 and breath sounds checked- equal and bilateral ?Secured at: 22 cm ?Tube secured with: Tape ?Dental Injury: Teeth and Oropharynx as per pre-operative assessment  ? ? ? ? ?

## 2021-06-29 NOTE — Progress Notes (Signed)
Update Note: ? ?I spoke with the patient's mother and father in the consultation room.  I explained that the procedure went well, and her gallbladder was able to be removed without issue.  She has dissolvable stitches under the skin with overlying skin glue, which will flake off in 10 to 14 days.  I have discharged her with Roxicodone, that I recommend she takes for spikes in pain.  I also recommend that she take scheduled Tylenol for few days after the surgery to help with her baseline pain.  If she takes the narcotic pain medication, she should take a stool softener as well.  She will have a phone follow-up with me in 2 weeks.  I have also discharged her with a work note to excuse her from work for the next week.  All questions were answered to their expressed satisfaction. ? ?Graciella Freer, DO ?[facility] ?[place], [place] [address] ?[phone] (office) ? ?

## 2021-06-29 NOTE — Op Note (Signed)
Operative Note ?  ?Preoperative Diagnosis: Symptomatic cholelithiasis, possible gallstone pancreatitis ?  ?Postoperative Diagnosis: Same ?  ?Procedure(s) Performed: Laparoscopic cholecystectomy ?  ?Surgeon: Graciella Freer, DO  ?  ?Assistants: Marquita Palms, RN ?  ?Anesthesia: General endotracheal ?  ?Anesthesiologist: Dr. Charna Elizabeth ?  ?Specimens: Gallbladder  ?  ?Estimated Blood Loss: Minimal  ?  ?Blood Replacement: None  ?  ?Complications: None  ?  ?Operative Findings: Non-inflamed gallbladder with large cystic duct ? ?Indications: Patient is a 36 year old female who was admitted to the hospital with pancreatitis.  She underwent abdominal imaging which demonstrated gallstones.  Given that the source of her pancreatitis could have been gallstones, she was referred to general surgery for cholecystectomy. ? ?All risks, benefits, and alternatives to laparoscopic cholecystectomy were discussed with the patient and her family, all of their questions were answered to their expressed satisfaction. The patient expresses she wishes to proceed, and informed consent was obtained. ?  ?Procedure: The patient was taken to the operating room and placed supine. General endotracheal anesthesia was induced. Intravenous antibiotics were administered per protocol. An orogastric tube positioned to decompress the stomach. The abdomen was prepared and draped in the usual sterile fashion.  ?  ?A infraumbilical incision was made and a Veress technique was utilized to achieve pneumoperitoneum to 15 mmHg with carbon dioxide. A 11 mm optiview port was placed through the supraumbilical region, and a 10 mm 0-degree operative laparoscope was introduced. The area underlying the trocar and Veress needle were inspected and without evidence of injury.  Remaining trocars were placed under direct vision. Two 5 mm ports were placed in the right abdomen, between the anterior axillary and midclavicular line.  A final 11 mm port was placed through the  mid-epigastrium, near the falciform ligament.  ?  ?The gallbladder fundus was elevated cephalad and the infundibulum was retracted to the patient's right. The gallbladder/cystic duct junction was skeletonized. The cystic artery noted in the triangle of Calot and was also skeletonized.  We then continued liberal medial and lateral dissection until the critical view of safety was achieved. The cystic duct was noted to be enlarged and a posterior branch of the cystic artery was also dissected out. ?  ?The cystic artery and posterior branch were doubly clipped and divided. Given the size of the cystic duct, it was divided using EndoGIA stapler. The gallbladder was then dissected from the liver bed with electrocautery. The specimen was placed in an Endopouch and was retrieved through the epigastric site. ?  ?Final inspection revealed acceptable hemostasis. Surgical SNOW was placed in the gallbladder bed.  Trocars were removed and pneumoperitoneum was released.  0 Vicryl fascial sutures were used to close the epigastric and umbilical port sites. Skin incisions were closed with 4-0 Monocryl subcuticular sutures and Dermabond. The patient was awakened from anesthesia and extubated without complication.  ?  ?Graciella Freer, DO  ?[facility] ?[place], [place] [address] ?[phone] (office) ? ? ?

## 2021-06-29 NOTE — Discharge Instructions (Signed)
Ambulatory Surgery Discharge Instructions  General Anesthesia or Sedation Do not drive or operate heavy machinery for 24 hours.  Do not consume alcohol, tranquilizers, sleeping medications, or any non-prescribed medications for 24 hours. Do not make important decisions or sign any important papers in the next 24 hours. You should have someone with you tonight at home.  Activity  You are advised to go directly home from the hospital.  Restrict your activities and rest for a day.  Resume light activity tomorrow. No heavy lifting over 10 lbs or strenuous exercise.  Fluids and Diet Begin with clear liquids, bouillon, dry toast, soda crackers.  If not nauseated, you may go to a regular diet when you desire.  Greasy and spicy foods are not advised.  Medications  If you have not had a bowel movement in 24 hours, take 2 tablespoons over the counter Milk of mag.             You May resume your blood thinners tomorrow (Aspirin, coumadin, or other).  You are being discharged with prescriptions for Opioid/Narcotic Medications: There are some specific considerations for these medications that you should know. Opioid Meds have risks & benefits. Addiction to these meds is always a concern with prolonged use Take medication only as directed Do not drive while taking narcotic pain medication Do not crush tablets or capsules Do not use a different container than medication was dispensed in Lock the container of medication in a cool, dry place out of reach of children and pets. Opioid medication can cause addiction Do not share with anyone else (this is a felony) Do not store medications for future use. Dispose of them properly.     Disposal:  Find a [place] household drug take back site near you.  If you can't get to a drug take back site, use the recipe below as a last resort to dispose of expired, unused or unwanted drugs. Disposal  (Do not dispose chemotherapy drugs this way, talk to your  prescribing doctor instead.) Step 1: Mix drugs (do not crush) with dirt, kitty litter, or used coffee grounds and add a small amount of water to dissolve any solid medications. Step 2: Seal drugs in plastic bag. Step 3: Place plastic bag in trash. Step 4: Take prescription container and scratch out personal information, then recycle or throw away.  Operative Site  You have a liquid bandage over your incisions, this will begin to flake off in about a week. Ok to shower tomorrow. Keep wound clean and dry. No baths or swimming. No lifting more than 10 pounds.  Contact Information: If you have questions or concerns, please call our office, [phone], Monday- Thursday 8AM-5PM and Friday 8AM-12Noon.  If it is after hours or on the weekend, please call [facility] Main Number, [phone], and ask to speak to the surgeon on call for Dr. Irisa Grimsley at [place].   SPECIFIC COMPLICATIONS TO WATCH FOR: Inability to urinate Fever over 101? F by mouth Nausea and vomiting lasting longer than 24 hours. Pain not relieved by medication ordered Swelling around the operative site Increased redness, warmth, hardness, around operative area Numbness, tingling, or cold fingers or toes Blood -soaked dressing, (small amounts of oozing may be normal) Increasing and progressive drainage from surgical area or exam site  

## 2021-06-29 NOTE — Anesthesia Preprocedure Evaluation (Addendum)
Anesthesia Evaluation  ?Patient identified by MRN, date of birth, ID band ?Patient awake ? ? ? ?Reviewed: ?Allergy & Precautions, NPO status , Patient's Chart, lab work & pertinent test results, reviewed documented beta blocker date and time  ? ?Airway ?Mallampati: II ? ?TM Distance: >3 FB ?Neck ROM: Full ? ? ? Dental ? ?(+) Dental Advisory Given ?  ?Pulmonary ?Current Smoker and Patient abstained from smoking.,  ?  ?Pulmonary exam normal ?breath sounds clear to auscultation ? ? ? ? ? ? Cardiovascular ?hypertension, Pt. on medications and Pt. on home beta blockers ?Normal cardiovascular exam ?Rhythm:Regular Rate:Normal ? ? ?  ?Neuro/Psych ?PSYCHIATRIC DISORDERS Depression   ? GI/Hepatic ?Neg liver ROS, GERD  Medicated,  ?Endo/Other  ?negative endocrine ROS ? Renal/GU ?negative Renal ROS  ? ?  ?Musculoskeletal ?negative musculoskeletal ROS ?(+)  ? Abdominal ?  ?Peds ? Hematology ?negative hematology ROS ?(+)   ?Anesthesia Other Findings ? ? Reproductive/Obstetrics ?negative OB ROS ? ?  ? ? ? ? ? ? ? ? ? ? ? ? ? ?  ?  ? ? ? ? ? ? ? ?Anesthesia Physical ?Anesthesia Plan ? ?ASA: 2 ? ?Anesthesia Plan: General  ? ?Post-op Pain Management: Dilaudid IV  ? ?Induction: Intravenous ? ?PONV Risk Score and Plan: 4 or greater and Ondansetron, Dexamethasone, Midazolam and Scopolamine patch - Pre-op ? ?Airway Management Planned: Oral ETT ? ?Additional Equipment:  ? ?Intra-op Plan:  ? ?Post-operative Plan: Extubation in OR ? ?Informed Consent: I have reviewed the patients History and Physical, chart, labs and discussed the procedure including the risks, benefits and alternatives for the proposed anesthesia with the patient or authorized representative who has indicated his/her understanding and acceptance.  ? ? ? ?Dental advisory given ? ?Plan Discussed with: CRNA and Surgeon ? ?Anesthesia Plan Comments:   ? ? ? ? ? ? ?Anesthesia Quick Evaluation ? ?

## 2021-06-29 NOTE — Anesthesia Postprocedure Evaluation (Signed)
Anesthesia Post Note ? ?Patient: Kimberly Patrick ? ?Procedure(s) Performed: LAPAROSCOPIC CHOLECYSTECTOMY ? ?Patient location during evaluation: Phase II ?Anesthesia Type: General ?Level of consciousness: awake and alert and oriented ?Pain management: pain level controlled ?Vital Signs Assessment: post-procedure vital signs reviewed and stable ?Respiratory status: spontaneous breathing, nonlabored ventilation and respiratory function stable ?Cardiovascular status: blood pressure returned to baseline and stable ?Postop Assessment: no apparent nausea or vomiting ?Anesthetic complications: no ? ? ?No notable events documented. ? ? ?Last Vitals:  ?Vitals:  ? 06/29/21 1030 06/29/21 1039  ?BP: (!) 146/91 (!) 139/96  ?Pulse: 82 79  ?Resp: (!) 25 18  ?Temp:  37.1 ?C  ?SpO2: 92% 91%  ?  ?Last Pain:  ?Vitals:  ? 06/29/21 1039  ?TempSrc: Oral  ?PainSc: 6   ? ? ?  ?  ?  ?  ?  ?  ? ?Vitoria Conyer C Nazyia Gaugh ? ? ? ? ?

## 2021-06-29 NOTE — Interval H&P Note (Signed)
History and Physical Interval Note: ? ?06/29/2021 ?8:18 AM ? ?Kimberly Patrick  has presented today for surgery, with the diagnosis of CHOLELITHIASIS ?GALLSTONE PANCREATITIS.  The various methods of treatment have been discussed with the patient and family. After consideration of risks, benefits and other options for treatment, the patient has consented to  Procedure(s): ?LAPAROSCOPIC CHOLECYSTECTOMY (N/A) as a surgical intervention.  The patient's history has been reviewed, patient examined, no change in status, stable for surgery.  I have reviewed the patient's chart and labs.  Questions were answered to the patient's satisfaction.   ? ? ?Shira Bobst A Morgin Halls ? ? ?

## 2021-06-30 ENCOUNTER — Encounter (HOSPITAL_COMMUNITY): Payer: Self-pay | Admitting: Surgery

## 2021-06-30 LAB — SURGICAL PATHOLOGY

## 2021-07-12 ENCOUNTER — Ambulatory Visit (INDEPENDENT_AMBULATORY_CARE_PROVIDER_SITE_OTHER): Payer: Medicaid Other | Admitting: Surgery

## 2021-07-12 DIAGNOSIS — Z09 Encounter for follow-up examination after completed treatment for conditions other than malignant neoplasm: Secondary | ICD-10-CM

## 2021-07-12 NOTE — Progress Notes (Signed)
[facility] ? ?I am calling the patient for post operative evaluation. This is not a billable encounter as it is under the global charges for the surgery.  The patient had a laparoscopic cholecystectomy on 4/26. The patient reports that she has been doing well since the surgery.  She does complain of some intermittent pain associated with her incision site at her umbilicus, especially with bending over.  I explained that pain at her incision sites is normal for up to a month after surgery.  She should take Tylenol as needed for pain.  She is tolerating a diet without nausea and vomiting and moving her bowels.  She did express that she has had some increasing heartburn.  I advised her to take over-the-counter Prilosec daily over a week or 2 to see if this alleviates her pain.  If it does not, she may require evaluation by GI for possible EGD.  The incisions are healing well.  Some of her incisions still have the skin glue in place.  I advised her that she can use antibiotic ointment to help dissolve the remaining skin glue.. The patient has no other concerns.  All questions answered to her expressed satisfaction. ? ?Pathology: ?A. GALLBLADDER, CHOLECYSTECTOMY:  ?-  Chronic cholecystitis ? ?Will see the patient PRN.  ? ?Theophilus Kinds, DO ?[facility] ?[address] ?[place], [place] [address] ?[phone] (office) ? ? ?

## 2021-08-02 ENCOUNTER — Ambulatory Visit (HOSPITAL_COMMUNITY): Admission: RE | Admit: 2021-08-02 | Payer: Medicaid Other | Source: Ambulatory Visit

## 2021-09-16 ENCOUNTER — Emergency Department (HOSPITAL_COMMUNITY): Payer: Medicaid Other

## 2021-09-16 ENCOUNTER — Other Ambulatory Visit: Payer: Self-pay

## 2021-09-16 ENCOUNTER — Inpatient Hospital Stay (HOSPITAL_COMMUNITY)
Admission: EM | Admit: 2021-09-16 | Discharge: 2021-09-20 | DRG: 440 | Disposition: A | Discharge status: Home / Self Care | Payer: Medicaid Other | Attending: Family Medicine | Admitting: Family Medicine

## 2021-09-16 DIAGNOSIS — D6489 Other specified anemias: Secondary | ICD-10-CM | POA: Diagnosis present

## 2021-09-16 DIAGNOSIS — F1090 Alcohol use, unspecified, uncomplicated: Secondary | ICD-10-CM

## 2021-09-16 DIAGNOSIS — Z823 Family history of stroke: Secondary | ICD-10-CM | POA: Diagnosis not present

## 2021-09-16 DIAGNOSIS — Z8249 Family history of ischemic heart disease and other diseases of the circulatory system: Secondary | ICD-10-CM | POA: Diagnosis not present

## 2021-09-16 DIAGNOSIS — Z28310 Unvaccinated for covid-19: Secondary | ICD-10-CM

## 2021-09-16 DIAGNOSIS — Z9049 Acquired absence of other specified parts of digestive tract: Secondary | ICD-10-CM | POA: Diagnosis not present

## 2021-09-16 DIAGNOSIS — F17210 Nicotine dependence, cigarettes, uncomplicated: Secondary | ICD-10-CM | POA: Diagnosis present

## 2021-09-16 DIAGNOSIS — Z833 Family history of diabetes mellitus: Secondary | ICD-10-CM

## 2021-09-16 DIAGNOSIS — E8809 Other disorders of plasma-protein metabolism, not elsewhere classified: Secondary | ICD-10-CM | POA: Diagnosis present

## 2021-09-16 DIAGNOSIS — O039 Complete or unspecified spontaneous abortion without complication: Secondary | ICD-10-CM

## 2021-09-16 DIAGNOSIS — Z803 Family history of malignant neoplasm of breast: Secondary | ICD-10-CM | POA: Diagnosis not present

## 2021-09-16 DIAGNOSIS — F1010 Alcohol abuse, uncomplicated: Secondary | ICD-10-CM | POA: Diagnosis present

## 2021-09-16 DIAGNOSIS — F17200 Nicotine dependence, unspecified, uncomplicated: Secondary | ICD-10-CM

## 2021-09-16 DIAGNOSIS — D72829 Elevated white blood cell count, unspecified: Secondary | ICD-10-CM

## 2021-09-16 DIAGNOSIS — F32A Depression, unspecified: Secondary | ICD-10-CM | POA: Diagnosis present

## 2021-09-16 DIAGNOSIS — K859 Acute pancreatitis without necrosis or infection, unspecified: Principal | ICD-10-CM

## 2021-09-16 DIAGNOSIS — K852 Alcohol induced acute pancreatitis without necrosis or infection: Principal | ICD-10-CM | POA: Diagnosis present

## 2021-09-16 DIAGNOSIS — I10 Essential (primary) hypertension: Secondary | ICD-10-CM

## 2021-09-16 DIAGNOSIS — E876 Hypokalemia: Secondary | ICD-10-CM

## 2021-09-16 LAB — COMPREHENSIVE METABOLIC PANEL
ALT: 18 U/L (ref 0–44)
AST: 25 U/L (ref 15–41)
Albumin: 3.2 g/dL — ABNORMAL LOW (ref 3.5–5.0)
Alkaline Phosphatase: 76 U/L (ref 38–126)
Anion gap: 15 (ref 5–15)
BUN: 9 mg/dL (ref 6–20)
CO2: 25 mmol/L (ref 22–32)
Calcium: 8.2 mg/dL — ABNORMAL LOW (ref 8.9–10.3)
Chloride: 102 mmol/L (ref 98–111)
Creatinine, Ser: 0.82 mg/dL (ref 0.44–1.00)
GFR, Estimated: >60 mL/min (ref >60)
Glucose, Bld: 131 mg/dL — ABNORMAL HIGH (ref 70–99)
Potassium: 2.6 mmol/L — CL (ref 3.5–5.1)
Sodium: 142 mmol/L (ref 135–145)
Total Bilirubin: 0.4 mg/dL (ref 0.3–1.2)
Total Protein: 7.4 g/dL (ref 6.5–8.1)

## 2021-09-16 LAB — CBC WITH DIFFERENTIAL/PLATELET
Abs Immature Granulocytes: 0.11 10*3/uL — ABNORMAL HIGH (ref 0.00–0.07)
Basophils Absolute: 0 10*3/uL (ref 0.0–0.1)
Basophils Relative: 0 %
Eosinophils Absolute: 0 10*3/uL (ref 0.0–0.5)
Eosinophils Relative: 0 %
HCT: 38.4 % (ref 36.0–46.0)
Hemoglobin: 12.9 g/dL (ref 12.0–15.0)
Immature Granulocytes: 1 %
Lymphocytes Relative: 5 %
Lymphs Abs: 1.1 10*3/uL (ref 0.7–4.0)
MCH: 29.7 pg (ref 26.0–34.0)
MCHC: 33.6 g/dL (ref 30.0–36.0)
MCV: 88.3 fL (ref 80.0–100.0)
Monocytes Absolute: 0.7 10*3/uL (ref 0.1–1.0)
Monocytes Relative: 3 %
Neutro Abs: 18.2 10*3/uL — ABNORMAL HIGH (ref 1.7–7.7)
Neutrophils Relative %: 91 %
Platelets: 387 10*3/uL (ref 150–400)
RBC: 4.35 MIL/uL (ref 3.87–5.11)
RDW: 14.8 % (ref 11.5–15.5)
WBC: 20.1 10*3/uL — ABNORMAL HIGH (ref 4.0–10.5)
nRBC: 0 % (ref 0.0–0.2)

## 2021-09-16 LAB — HCG, QUANTITATIVE, PREGNANCY: hCG, Beta Chain, Quant, S: 6 m[IU]/mL — ABNORMAL HIGH (ref <5)

## 2021-09-16 LAB — I-STAT BETA HCG BLOOD, ED (MC, WL, AP ONLY): I-stat hCG, quantitative: 5.2 m[IU]/mL — ABNORMAL HIGH (ref <5)

## 2021-09-16 LAB — PROCALCITONIN: Procalcitonin: <0.1 ng/mL

## 2021-09-16 LAB — LIPASE, BLOOD: Lipase: 203 U/L — ABNORMAL HIGH (ref 11–51)

## 2021-09-16 MED ORDER — ADULT MULTIVITAMIN W/MINERALS CH
1.0000 | ORAL_TABLET | Freq: Every day | ORAL | Status: DC
  Administered 2021-09-16 – 2021-09-20 (×5): 1 via ORAL
  Filled 2021-09-16 (×5): qty 1

## 2021-09-16 MED ORDER — ACETAMINOPHEN 650 MG RE SUPP: 650.0000 mg | Freq: Four times a day (QID) | RECTAL | Status: DC | PRN

## 2021-09-16 MED ORDER — HYDROMORPHONE HCL 1 MG/ML IJ SOLN
1.0000 mg | Freq: Once | INTRAMUSCULAR | Status: AC
  Administered 2021-09-16: 1 mg via INTRAVENOUS
  Filled 2021-09-16: qty 1

## 2021-09-16 MED ORDER — ONDANSETRON HCL 4 MG PO TABS: 4.0000 mg | ORAL_TABLET | Freq: Four times a day (QID) | ORAL | Status: DC | PRN

## 2021-09-16 MED ORDER — HYDRALAZINE HCL 25 MG PO TABS
25.0000 mg | ORAL_TABLET | Freq: Three times a day (TID) | ORAL | Status: DC
  Administered 2021-09-16 – 2021-09-20 (×12): 25 mg via ORAL
  Filled 2021-09-16 (×12): qty 1

## 2021-09-16 MED ORDER — HYDROCHLOROTHIAZIDE 12.5 MG PO TABS
12.5000 mg | ORAL_TABLET | Freq: Every day | ORAL | Status: DC
  Administered 2021-09-16: 12.5 mg via ORAL
  Filled 2021-09-16 (×2): qty 1

## 2021-09-16 MED ORDER — MORPHINE SULFATE (PF) 2 MG/ML IV SOLN
2.0000 mg | INTRAVENOUS | Status: DC | PRN
  Administered 2021-09-16 – 2021-09-20 (×14): 2 mg via INTRAVENOUS
  Filled 2021-09-16 (×14): qty 1

## 2021-09-16 MED ORDER — ACETAMINOPHEN 325 MG PO TABS
650.0000 mg | ORAL_TABLET | Freq: Four times a day (QID) | ORAL | Status: DC | PRN
  Administered 2021-09-19 – 2021-09-20 (×2): 650 mg via ORAL
  Filled 2021-09-16 (×2): qty 2

## 2021-09-16 MED ORDER — FENTANYL CITRATE PF 50 MCG/ML IJ SOSY
50.0000 ug | PREFILLED_SYRINGE | Freq: Once | INTRAMUSCULAR | Status: AC
  Administered 2021-09-16: 50 ug via INTRAVENOUS
  Filled 2021-09-16: qty 1

## 2021-09-16 MED ORDER — ATENOLOL 25 MG PO TABS
50.0000 mg | ORAL_TABLET | Freq: Every day | ORAL | Status: DC
  Administered 2021-09-16 – 2021-09-19 (×4): 50 mg via ORAL
  Filled 2021-09-16 (×4): qty 2

## 2021-09-16 MED ORDER — POTASSIUM CHLORIDE 10 MEQ/100ML IV SOLN
10.0000 meq | INTRAVENOUS | Status: AC
  Administered 2021-09-16 – 2021-09-17 (×4): 10 meq via INTRAVENOUS
  Filled 2021-09-16 (×4): qty 100

## 2021-09-16 MED ORDER — OXYCODONE HCL 5 MG PO TABS
5.0000 mg | ORAL_TABLET | ORAL | Status: DC | PRN
  Administered 2021-09-16 – 2021-09-20 (×12): 5 mg via ORAL
  Filled 2021-09-16 (×12): qty 1

## 2021-09-16 MED ORDER — LISINOPRIL 10 MG PO TABS
20.0000 mg | ORAL_TABLET | Freq: Every day | ORAL | Status: DC
Start: 2021-09-16 — End: 2021-09-17
  Administered 2021-09-16 – 2021-09-17 (×2): 20 mg via ORAL
  Filled 2021-09-16 (×2): qty 2

## 2021-09-16 MED ORDER — POTASSIUM CHLORIDE CRYS ER 20 MEQ PO TBCR
40.0000 meq | EXTENDED_RELEASE_TABLET | Freq: Once | ORAL | Status: AC
  Administered 2021-09-16: 40 meq via ORAL
  Filled 2021-09-16: qty 2

## 2021-09-16 MED ORDER — FOLIC ACID 1 MG PO TABS
1.0000 mg | ORAL_TABLET | Freq: Every day | ORAL | Status: DC
  Administered 2021-09-16 – 2021-09-20 (×5): 1 mg via ORAL
  Filled 2021-09-16 (×5): qty 1

## 2021-09-16 MED ORDER — THIAMINE HCL 100 MG/ML IJ SOLN
100.0000 mg | Freq: Every day | INTRAMUSCULAR | Status: DC
  Administered 2021-09-17: 100 mg via INTRAVENOUS
  Filled 2021-09-16 (×2): qty 2

## 2021-09-16 MED ORDER — ONDANSETRON HCL 4 MG/2ML IJ SOLN: 4.0000 mg | Freq: Four times a day (QID) | INTRAMUSCULAR | Status: DC | PRN

## 2021-09-16 MED ORDER — HEPARIN SODIUM (PORCINE) 5000 UNIT/ML IJ SOLN
5000.0000 [IU] | Freq: Three times a day (TID) | INTRAMUSCULAR | Status: DC
  Administered 2021-09-16 – 2021-09-20 (×12): 5000 [IU] via SUBCUTANEOUS
  Filled 2021-09-16 (×12): qty 1

## 2021-09-16 MED ORDER — MAGNESIUM OXIDE -MG SUPPLEMENT 400 (240 MG) MG PO TABS
800.0000 mg | ORAL_TABLET | Freq: Once | ORAL | Status: AC
  Administered 2021-09-16: 800 mg via ORAL
  Filled 2021-09-16: qty 2

## 2021-09-16 MED ORDER — LACTATED RINGERS IV BOLUS
1000.0000 mL | Freq: Once | INTRAVENOUS | Status: AC
  Administered 2021-09-16: 1000 mL via INTRAVENOUS

## 2021-09-16 MED ORDER — LISINOPRIL-HYDROCHLOROTHIAZIDE 20-12.5 MG PO TABS: 1.0000 | ORAL_TABLET | Freq: Every day | ORAL | Status: DC

## 2021-09-16 MED ORDER — FENTANYL CITRATE (PF) 100 MCG/2ML IJ SOLN
100.0000 ug | Freq: Once | INTRAMUSCULAR | Status: AC
  Administered 2021-09-16: 100 ug via INTRAVENOUS
  Filled 2021-09-16: qty 2

## 2021-09-16 MED ORDER — ONDANSETRON HCL 4 MG/2ML IJ SOLN
4.0000 mg | Freq: Once | INTRAMUSCULAR | Status: AC
  Administered 2021-09-16: 4 mg via INTRAVENOUS
  Filled 2021-09-16: qty 2

## 2021-09-16 MED ORDER — SODIUM CHLORIDE 0.9 % IV SOLN: INTRAVENOUS | Status: AC

## 2021-09-16 MED ORDER — LORAZEPAM 1 MG PO TABS: 1.0000 mg | ORAL_TABLET | ORAL | Status: AC | PRN

## 2021-09-16 MED ORDER — THIAMINE HCL 100 MG PO TABS
100.0000 mg | ORAL_TABLET | Freq: Every day | ORAL | Status: DC
  Administered 2021-09-16 – 2021-09-20 (×4): 100 mg via ORAL
  Filled 2021-09-16 (×4): qty 1

## 2021-09-16 MED ORDER — LORAZEPAM 2 MG/ML IJ SOLN: 1.0000 mg | INTRAMUSCULAR | Status: AC | PRN

## 2021-09-16 NOTE — Assessment & Plan Note (Signed)
-   Atenolol, hydrochlorothiazide, lisinopril, hydralazine

## 2021-09-16 NOTE — Assessment & Plan Note (Signed)
-   Advised on the importance of cessation especially in the setting of recurrent pancreatitis -Last drink 1 week ago, reports binge drinking on the weekends with no history of withdrawal.   -Start CIWA

## 2021-09-16 NOTE — Assessment & Plan Note (Signed)
-   UA pending -No infectious symptoms -Most likely acute phase reaction in the setting of acute pancreatitis and recent miscarriage -Add on Pro-Cal -Continue to monitor

## 2021-09-16 NOTE — Assessment & Plan Note (Signed)
-   Reports half a pack per day -Declines nicotine patch at this time -Counseled on the importance of cessation

## 2021-09-16 NOTE — Assessment & Plan Note (Signed)
-   Likely secondary to poor p.o. intake given abdominal pain -When patient is able to tolerate diet, encourage p.o. intake with nutrient dense food choices -Trend with a.m. CMP

## 2021-09-16 NOTE — ED Notes (Signed)
Date and time results received: 09/16/21 1637 (use smartphrase ".now" to insert current time)  Test: K+ Critical Value: 2.6  Name of Provider Notified: Dr. Posey Rea  Orders Received? Or Actions Taken?: see chart

## 2021-09-16 NOTE — Assessment & Plan Note (Signed)
-   40 mEq potassium given p.o. in the ED -Continue 4 more rounds of IV potassium -Current potassium 2.6, trend in the a.m.

## 2021-09-16 NOTE — ED Triage Notes (Signed)
Pt arrived via [facility] c/o possible miscarriage ac ording to the health department. Per EMS, pt [redacted] weeks pregnant and experienced vaginal bleeding Saturday, Sunday, and Monday--the bleeding has subsided but now pt is experiencing cramping and contractions.

## 2021-09-16 NOTE — Assessment & Plan Note (Signed)
-   Recently told she is having a miscarriage, approximately 2-[redacted] weeks along -Vaginal bleeding has resolved -Beta hCG is down to 6, repeat in the a.m. to confirm downtrending -Ultrasound OB shows no intrauterine pregnancy, evidence of failed gestation with complete abortion

## 2021-09-16 NOTE — H&P (Signed)
History and Physical    Patient: Kimberly Patrick LPF:[id number] DOB: [birth year] DOA: 09/16/2021 DOS: the patient was seen and examined on 09/16/2021 PCP: Mariel Kansky, MD  Patient coming from: Home  Chief Complaint:  Chief Complaint  Patient presents with   Threatened Miscarriage   HPI: Kimberly Patrick is a 36 y.o. female with medical history significant of depression, recent cholecystectomy secondary to suspected gallstone pancreatitis, hypertension, recent miscarriage, and more presents the ED with a chief complaint of abdominal pain.  Apparently upon presentation patient was flip-flopping all over the hospital bed screaming in pain.  She reports she has had abdominal pain and vomiting that started this AM.  The abdominal pain is diffuse and feels like contractions.  She reports the pain is severe although I had to wake her up when I walked into the room.  She had 2 doses of fentanyl, and 1 dose of Dilaudid in the ER and reports none of them have even touched her pain.  Patient reports that she had 1 episode of vomiting this a.m. and none since.  Her last meal was yesterday.  This pain does feel like her previous pancreatitis.  When she had nausea and vomiting it was clear and nonbloody.  Her last normal bowel movement was this morning.  The bowel movement temporarily improve the pain.  Patient reports her chief complaint is miscarriage.  She reports she was being told that she was having a miscarriage by the health department 4 days ago.  He was told this because she was having a full.  After having a pregnancy test that was positive.  Her last normal period was the previous month.  She believes she was 2-[redacted] weeks along.  Her last drinking binge was 5 days ago.  She has had no vaginal bleeding today.  Patient denies any lightheadedness, chest pain, palpitations, paresthesias.  She does report mild occasional dyspnea.  No other complaints on review of systems.  Patient does smoke half a  pack per day.  She binge drinks alcohol shots on the weekends.  Her last marijuana use was yesterday.  She does not use other illicit substances.  She is not vaccinated for COVID.  Patient is full code. Review of Systems: As mentioned in the history of present illness. All other systems reviewed and are negative. Past Medical History:  Diagnosis Date   Depression    Gallstones and inflammation of gallbladder without obstruction 03/28/2012   Heart palpitations    Hypertension    Past Surgical History:  Procedure Laterality Date   CHOLECYSTECTOMY N/A 06/29/2021   Procedure: LAPAROSCOPIC CHOLECYSTECTOMY;  Surgeon: Lewie Chamber, DO;  Location: [facility];  Service: General;  Laterality: N/A;   Social History:  reports that she has been smoking cigarettes. She has been smoking an average of .5 packs per day. She has never used smokeless tobacco. She reports current alcohol use. She reports current drug use. Drug: Marijuana.  Allergies  Allergen Reactions   Gadopentetate Hives, Other (See Comments) and Swelling    Sneezing, coughing, hives, lip swelling   Ivp Dye [Iodinated Contrast Media] Hives and Shortness Of Breath    Family History  Problem Relation Age of Onset   Coronary artery disease Mother    Hypertension Mother    Breast cancer Cousin    Diabetes Maternal Aunt    Stroke Maternal Aunt     Prior to Admission medications   Medication Sig Start Date End Date Taking? Authorizing Provider  atenolol (  TENORMIN) 50 MG tablet Take 1 tablet (50 mg total) by mouth daily. For BP 08/13/20 09/16/21 Yes Emokpae, Courage, MD  hydrALAZINE (APRESOLINE) 25 MG tablet Take 1 tablet (25 mg total) by mouth every 8 (eight) hours. 06/03/21  Yes Johnson, Clanford L, MD  lisinopril-hydrochlorothiazide (ZESTORETIC) 20-12.5 MG tablet Take 1 tablet by mouth daily. 06/03/21 06/03/22 Yes Johnson, Clanford L, MD  potassium chloride (KLOR-CON M) 10 MEQ tablet Take 1 tablet (10 mEq total) by mouth daily.  06/04/21  Yes Johnson, Clanford L, MD  diphenhydrAMINE (BENADRYL) 50 MG tablet Take 1 tablet (50 mg total) by mouth once for 1 dose. Patient not taking: Reported on 09/16/2021 06/14/21 06/14/21  Dolores Frame, MD  docusate sodium (COLACE) 100 MG capsule Take 1 capsule (100 mg total) by mouth 2 (two) times daily. Patient not taking: Reported on 09/16/2021 06/29/21 06/29/22  PappayliouGustavus Messing, DO    Physical Exam: Vitals:   09/16/21 1439 09/16/21 1716 09/16/21 2108  BP: (!) 149/87 (!) 147/88 (!) 144/80  Pulse: 88 88 93  Resp: 20 20 20   Temp: 98.1 F (36.7 C)  97.8 F (36.6 C)  TempSrc: Oral  Oral  SpO2:  99% (!) 52%   1.  General: Patient lying supine in bed, initially asleep, but when I started asking about pain she sat up and started rocking back and forth on the bed whimpering   2. Psychiatric: Alert and oriented x 3, mood is baseline, behavior is pain med seeking, pleasant and cooperative with exam   3. Neurologic: Speech and language are normal, face is symmetric, moves all 4 extremities voluntarily, at baseline without acute deficits on limited exam   4. HEENMT:  Head is atraumatic, normocephalic, pupils reactive to light, neck is supple, trachea is midline, mucous membranes are moist   5. Respiratory : Lungs are clear to auscultation bilaterally without wheezing, rhonchi, rales, no cyanosis, no increase in work of breathing or accessory muscle use   6. Cardiovascular : Heart rate normal, rhythm is regular, no murmurs, rubs or gallops, no peripheral edema, peripheral pulses palpated   7. Gastrointestinal:  Abdomen is soft, nondistended, nontender to palpation bowel sounds active, no masses or organomegaly palpated   8. Skin:  Skin is warm, dry and intact without rashes, acute lesions, or ulcers on limited exam   9.Musculoskeletal:  No acute deformities or trauma, no asymmetry in tone, no peripheral edema, peripheral pulses palpated, no tenderness to palpation  in the extremities  Data Reviewed: In the ED Temp 98.1, heart rate 88, respiratory rate 20, blood pressure 147/87-149/88 Leukocytosis 20.1, hemoglobin 12.9, platelets 387 Chemistry is positive for hypokalemia 2.6, decreased albumin 3.2, lipase elevated at 203 Beta-hCG is 6 CT abdomen pelvis shows acute pancreatitis Ultrasound OB shows no intrauterine pregnancy UA pending Fentanyl 50 mcg then 100 mcg Dilaudid 1 mg LR 1 L Mag-Ox, Zofran, 40 mEq p.o. potassium Admission requested for acute pancreatitis  Assessment and Plan: * Acute pancreatitis - Lipase 203 -Acute pancreatitis on CT -Cholecystectomy secondary to suspected gallstone pancreatitis earlier this year -CT shows no biliary dilatation -Check lipid panel -Holding off on right upper quadrant ultrasound at this time given recent cholecystectomy, no dilatation on CT -Patient's last drink was almost a week ago, she does report drinking shots on the weekends, advised on the importance of cessation -Possibly related to stress event: Recent miscarriage  HTN (hypertension) - Atenolol, hydrochlorothiazide, lisinopril, hydralazine  Miscarriage - Recently told she is having a miscarriage, approximately 2-[redacted] weeks along -  Vaginal bleeding has resolved -Beta hCG is down to 6, repeat in the a.m. to confirm downtrending -Ultrasound OB shows no intrauterine pregnancy, evidence of failed gestation with complete abortion   Hypoalbuminemia - Likely secondary to poor p.o. intake given abdominal pain -When patient is able to tolerate diet, encourage p.o. intake with nutrient dense food choices -Trend with a.m. CMP  Alcohol use disorder - Advised on the importance of cessation especially in the setting of recurrent pancreatitis -Last drink 1 week ago, reports binge drinking on the weekends with no history of withdrawal.   -Start CIWA  Tobacco use disorder - Reports half a pack per day -Declines nicotine patch at this time -Counseled  on the importance of cessation  Hypokalemia - 40 mEq potassium given p.o. in the ED -Continue 4 more rounds of IV potassium -Current potassium 2.6, trend in the a.m.      Advance Care Planning:   Code Status: Full Code   Consults: None  Family Communication: No family  Severity of Illness: The appropriate patient status for this patient is INPATIENT. Inpatient status is judged to be reasonable and necessary in order to provide the required intensity of service to ensure the patient's safety. The patient's presenting symptoms, physical exam findings, and initial radiographic and laboratory data in the context of their chronic comorbidities is felt to place them at high risk for further clinical deterioration. Furthermore, it is not anticipated that the patient will be medically stable for discharge from the hospital within 2 midnights of admission.   * I certify that at the point of admission it is my clinical judgment that the patient will require inpatient hospital care spanning beyond 2 midnights from the point of admission due to high intensity of service, high risk for further deterioration and high frequency of surveillance required.*  Author: Lilyan Gilford, DO 09/16/2021 9:09 PM  For on call review [email or web address].

## 2021-09-16 NOTE — Assessment & Plan Note (Signed)
-   Lipase 203 -Acute pancreatitis on CT -Cholecystectomy secondary to suspected gallstone pancreatitis earlier this year -CT shows no biliary dilatation -Check lipid panel -Holding off on right upper quadrant ultrasound at this time given recent cholecystectomy, no dilatation on CT -Patient's last drink was almost a week ago, she does report drinking shots on the weekends, advised on the importance of cessation -Possibly related to stress event: Recent miscarriage

## 2021-09-16 NOTE — ED Provider Notes (Signed)
[facility] EMERGENCY DEPARTMENT Provider Note  CSN: [id number] Arrival date & time: 09/16/21 1426  Chief Complaint(s) Threatened Miscarriage  HPI Kimberly Patrick is a 36 y.o. G3 P2 female approximately [redacted] weeks pregnant (?)  Who presents the emergency department for evaluation of abdominal pain.  Patient states that she is been having vaginal bleeding and went to her primary care physician 2 days ago who found the patient to have a positive pregnancy test and told her that she may be having a miscarriage.  At that time the primary care physician told the patient that she was at least [redacted] weeks pregnant.  She states that her bleeding has slowed, but her abdominal pain is worsened and is coming in waves.  She states that it feels like contractions.  She denies nausea, vomiting, headache, fever or other systemic symptoms.  Patient is very uncomfortable on presentation rolling around in the bed.   Past Medical History Past Medical History:  Diagnosis Date   Depression    Gallstones and inflammation of gallbladder without obstruction 03/28/2012   Heart palpitations    Hypertension    Patient Active Problem List   Diagnosis Date Noted   Calculus of gallbladder without cholecystitis without obstruction    Pancreatic pseudocyst    Alcohol abuse    Acute pancreatitis 05/30/2021   GERD (gastroesophageal reflux disease) 05/30/2021   Serum phosphorus decreased 08/12/2020   HTN (hypertension) 08/12/2020   Obesity (BMI 30-39.9) 08/12/2020   Hypokalemia due to excessive renal loss of potassium 08/10/2020   Depression    Hypomagnesemia    Nicotine dependence    Supervision of other normal pregnancy 06/20/2012   Gallstones and inflammation of gallbladder without obstruction    Home Medication(s) Prior to Admission medications   Medication Sig Start Date End Date Taking? Authorizing Provider  atenolol (TENORMIN) 50 MG tablet Take 1 tablet (50 mg total) by mouth daily. For BP 08/13/20 08/13/21   Shon Hale, MD  diphenhydrAMINE (BENADRYL) 50 MG tablet Take 1 tablet (50 mg total) by mouth once for 1 dose. 06/14/21 06/14/21  Dolores Frame, MD  docusate sodium (COLACE) 100 MG capsule Take 1 capsule (100 mg total) by mouth 2 (two) times daily. 06/29/21 06/29/22  Pappayliou, Santina Evans A, DO  hydrALAZINE (APRESOLINE) 25 MG tablet Take 1 tablet (25 mg total) by mouth every 8 (eight) hours. 06/03/21   Johnson, Clanford L, MD  lisinopril-hydrochlorothiazide (ZESTORETIC) 20-12.5 MG tablet Take 1 tablet by mouth daily. 06/03/21 06/03/22  Johnson, Clanford L, MD  omeprazole (PRILOSEC) 20 MG capsule Take 20 mg by mouth daily. 06/03/21   [provider]  potassium chloride (KLOR-CON M) 10 MEQ tablet Take 1 tablet (10 mEq total) by mouth daily. 06/04/21   Cleora Fleet, MD  Past Surgical History Past Surgical History:  Procedure Laterality Date   CHOLECYSTECTOMY N/A 06/29/2021   Procedure: LAPAROSCOPIC CHOLECYSTECTOMY;  Surgeon: Lewie Chamber, DO;  Location: [facility];  Service: General;  Laterality: N/A;   Family History Family History  Problem Relation Age of Onset   Coronary artery disease Mother    Hypertension Mother    Breast cancer Cousin    Diabetes Maternal Aunt    Stroke Maternal Aunt     Social History Social History   Tobacco Use   Smoking status: Every Day    Packs/day: 0.50    Types: Cigarettes   Smokeless tobacco: Never  Vaping Use   Vaping Use: Never used  Substance Use Topics   Alcohol use: Yes    Comment: occ. use. no alcohol since preg.   Drug use: Yes    Types: Marijuana    Comment: occasionally   Allergies Gadopentetate and Ivp dye [iodinated contrast media]  Review of Systems Review of Systems  Gastrointestinal:  Positive for abdominal pain.  Genitourinary:  Positive for pelvic pain and vaginal  bleeding.    Physical Exam Vital Signs  I have reviewed the triage vital signs BP (!) 149/87 (BP Location: Left Wrist)   Pulse 88   Temp 98.1 F (36.7 C) (Oral)   Resp 20   Physical Exam Vitals and nursing note reviewed.  Constitutional:      General: She is not in acute distress.    Appearance: She is well-developed. She is ill-appearing.  HENT:     Head: Normocephalic and atraumatic.  Eyes:     Conjunctiva/sclera: Conjunctivae normal.  Cardiovascular:     Rate and Rhythm: Normal rate and regular rhythm.     Heart sounds: No murmur heard. Pulmonary:     Effort: Pulmonary effort is normal. No respiratory distress.     Breath sounds: Normal breath sounds.  Abdominal:     Palpations: Abdomen is soft.     Tenderness: There is abdominal tenderness.  Musculoskeletal:        General: No swelling.     Cervical back: Neck supple.  Skin:    General: Skin is warm and dry.     Capillary Refill: Capillary refill takes less than 2 seconds.  Neurological:     Mental Status: She is alert.  Psychiatric:        Mood and Affect: Mood normal.     ED Results and Treatments Labs (all labs ordered are listed, but only abnormal results are displayed) Labs Reviewed  CBC WITH DIFFERENTIAL/PLATELET  HCG, QUANTITATIVE, PREGNANCY  COMPREHENSIVE METABOLIC PANEL  LIPASE, BLOOD  URINALYSIS, ROUTINE W REFLEX MICROSCOPIC  I-STAT BETA HCG BLOOD, ED ([facility], [facility], [facility] ONLY)                                                                                                                          Radiology No results found.  Pertinent labs & imaging results that were available during my  care of the patient were reviewed by me and considered in my medical decision making (see MDM for details).  Medications Ordered in ED Medications  fentaNYL (SUBLIMAZE) injection 50 mcg (50 mcg Intravenous Given 09/16/21 1540)  ondansetron (ZOFRAN) injection 4 mg (4 mg Intravenous Given 09/16/21 1539)                                                                                                                                      Procedures .Critical Care  Performed by: Glendora Score, MD Authorized by: Glendora Score, MD   Critical care provider statement:    Critical care time (minutes):  30   Critical care was necessary to treat or prevent imminent or life-threatening deterioration of the following conditions:  Metabolic crisis (Critical hypokalemia)   Critical care was time spent personally by me on the following activities:  Development of treatment plan with patient or surrogate, discussions with consultants, evaluation of patient's response to treatment, examination of patient, ordering and review of laboratory studies, ordering and review of radiographic studies, ordering and performing treatments and interventions, pulse oximetry, re-evaluation of patient's condition and review of old charts   (including critical care time)  Medical Decision Making / ED Course   This patient presents to the ED for concern of abdominal pain, vaginal bleeding, this involves an extensive number of treatment options, and is a complaint that carries with it a high risk of complications and morbidity.  The differential diagnosis includes ectopic pregnancy, miscarriage, intra-abdominal infection, STD, pancreatitis, cystitis  MDM: Patient seen emergency room for evaluation of abdominal pain and vaginal bleeding.  Physical exam with tenderness more in the epigastrium and patient appears very uncomfortable appearing and writhing around in the bed.  Bedside ultrasound with no intrauterine contents.  Patient taken immediately for transvaginal ultrasound for ectopic rule out which does not show any definitive evidence of ectopic pregnancy but also does not show an IUP.  Laboratory evaluation with a leukocytosis to 20.1, potassium is 2.6 and this was repleted in the emergency department, initial i-STAT beta-hCG 5.2 and  follow-up beta quant is 6.  This leads me to believe that patient has likely suffered a complete abortion over last few days and I am not entirely convinced that her presentation is related to that today.  A CT abdomen pelvis was obtained that shows acute pancreatitis with a lipase of 203, this seems to fit with her presentation much better.  Patient pain controlled and fluids initiated.  Patient then admitted to the hospitalist.   Additional history obtained:  -External records from outside source obtained and reviewed including: Chart review including previous notes, labs, imaging, consultation notes   Lab Tests: -I ordered, reviewed, and interpreted labs.   The pertinent results include:   Labs Reviewed  CBC WITH DIFFERENTIAL/PLATELET  HCG, QUANTITATIVE, PREGNANCY  COMPREHENSIVE METABOLIC PANEL  LIPASE, BLOOD  URINALYSIS, ROUTINE W REFLEX MICROSCOPIC  I-STAT  BETA HCG BLOOD, ED ([facility], [facility], [facility] ONLY)         Imaging Studies ordered: I ordered imaging studies including transvaginal ultrasound, CT abdomen pelvis I independently visualized and interpreted imaging. I agree with the radiologist interpretation   Medicines ordered and prescription drug management: Meds ordered this encounter  Medications   fentaNYL (SUBLIMAZE) injection 50 mcg   ondansetron (ZOFRAN) injection 4 mg    -I have reviewed the patients home medicines and have made adjustments as needed  Critical interventions Potassium repletion, fluid resuscitation    Cardiac Monitoring: The patient was maintained on a cardiac monitor.  I personally viewed and interpreted the cardiac monitored which showed an underlying rhythm of: NSR  Social Determinants of Health:  Factors impacting patients care include: none   Reevaluation: After the interventions noted above, I reevaluated the patient and found that they have :improved  Co morbidities that complicate the patient evaluation  Past Medical History:   Diagnosis Date   Depression    Gallstones and inflammation of gallbladder without obstruction 03/28/2012   Heart palpitations    Hypertension       Dispostion: I considered admission for this patient, and given pancreatitis, patient will require admission     Final Clinical Impression(s) / ED Diagnoses Final diagnoses:  None     @PCDICTATION @    , MD 09/16/21 1919

## 2021-09-17 ENCOUNTER — Encounter (HOSPITAL_COMMUNITY): Payer: Self-pay | Admitting: Family Medicine

## 2021-09-17 DIAGNOSIS — F1090 Alcohol use, unspecified, uncomplicated: Secondary | ICD-10-CM | POA: Diagnosis not present

## 2021-09-17 DIAGNOSIS — K859 Acute pancreatitis without necrosis or infection, unspecified: Secondary | ICD-10-CM | POA: Diagnosis not present

## 2021-09-17 DIAGNOSIS — I10 Essential (primary) hypertension: Secondary | ICD-10-CM | POA: Diagnosis not present

## 2021-09-17 LAB — CBC WITH DIFFERENTIAL/PLATELET
Abs Immature Granulocytes: 0.11 10*3/uL — ABNORMAL HIGH (ref 0.00–0.07)
Basophils Absolute: 0 10*3/uL (ref 0.0–0.1)
Basophils Relative: 0 %
Eosinophils Absolute: 0 10*3/uL (ref 0.0–0.5)
Eosinophils Relative: 0 %
HCT: 39.4 % (ref 36.0–46.0)
Hemoglobin: 12.9 g/dL (ref 12.0–15.0)
Immature Granulocytes: 1 %
Lymphocytes Relative: 6 %
Lymphs Abs: 1 10*3/uL (ref 0.7–4.0)
MCH: 29.6 pg (ref 26.0–34.0)
MCHC: 32.7 g/dL (ref 30.0–36.0)
MCV: 90.4 fL (ref 80.0–100.0)
Monocytes Absolute: 0.7 10*3/uL (ref 0.1–1.0)
Monocytes Relative: 4 %
Neutro Abs: 15.9 10*3/uL — ABNORMAL HIGH (ref 1.7–7.7)
Neutrophils Relative %: 89 %
Platelets: 335 10*3/uL (ref 150–400)
RBC: 4.36 MIL/uL (ref 3.87–5.11)
RDW: 14.9 % (ref 11.5–15.5)
WBC: 17.8 10*3/uL — ABNORMAL HIGH (ref 4.0–10.5)
nRBC: 0 % (ref 0.0–0.2)

## 2021-09-17 LAB — COMPREHENSIVE METABOLIC PANEL
ALT: 18 U/L (ref 0–44)
AST: 46 U/L — ABNORMAL HIGH (ref 15–41)
Albumin: 2.9 g/dL — ABNORMAL LOW (ref 3.5–5.0)
Alkaline Phosphatase: 101 U/L (ref 38–126)
Anion gap: 9 (ref 5–15)
BUN: 8 mg/dL (ref 6–20)
CO2: 30 mmol/L (ref 22–32)
Calcium: 8.2 mg/dL — ABNORMAL LOW (ref 8.9–10.3)
Chloride: 100 mmol/L (ref 98–111)
Creatinine, Ser: 0.78 mg/dL (ref 0.44–1.00)
GFR, Estimated: >60 mL/min (ref >60)
Glucose, Bld: 142 mg/dL — ABNORMAL HIGH (ref 70–99)
Potassium: 3.3 mmol/L — ABNORMAL LOW (ref 3.5–5.1)
Sodium: 139 mmol/L (ref 135–145)
Total Bilirubin: 0.6 mg/dL (ref 0.3–1.2)
Total Protein: 6.7 g/dL (ref 6.5–8.1)

## 2021-09-17 LAB — LIPID PANEL
Cholesterol: 104 mg/dL (ref 0–200)
HDL: 53 mg/dL (ref >40)
LDL Cholesterol: 29 mg/dL (ref 0–99)
Total CHOL/HDL Ratio: 2 RATIO
Triglycerides: 109 mg/dL (ref <150)
VLDL: 22 mg/dL (ref 0–40)

## 2021-09-17 LAB — HIV ANTIBODY (ROUTINE TESTING W REFLEX): HIV Screen 4th Generation wRfx: NONREACTIVE

## 2021-09-17 LAB — MAGNESIUM: Magnesium: 1.8 mg/dL (ref 1.7–2.4)

## 2021-09-17 LAB — HCG, QUANTITATIVE, PREGNANCY: hCG, Beta Chain, Quant, S: 4 m[IU]/mL (ref <5)

## 2021-09-17 MED ORDER — AMLODIPINE BESYLATE 5 MG PO TABS
10.0000 mg | ORAL_TABLET | Freq: Every day | ORAL | Status: DC
  Administered 2021-09-17 – 2021-09-20 (×4): 10 mg via ORAL
  Filled 2021-09-17 (×4): qty 2

## 2021-09-17 MED ORDER — SODIUM CHLORIDE 0.9 % IV SOLN: INTRAVENOUS | Status: DC

## 2021-09-17 MED ORDER — POTASSIUM CHLORIDE CRYS ER 20 MEQ PO TBCR
40.0000 meq | EXTENDED_RELEASE_TABLET | ORAL | Status: AC
  Administered 2021-09-17 (×2): 40 meq via ORAL
  Filled 2021-09-17 (×2): qty 2

## 2021-09-17 MED ORDER — LABETALOL HCL 5 MG/ML IV SOLN: 10.0000 mg | INTRAVENOUS | Status: DC | PRN

## 2021-09-17 NOTE — Progress Notes (Signed)
PROGRESS NOTE     Kimberly Patrick, is a 36 y.o. female, DOB - 1985-04-18, [id number]  Admit date - 09/16/2021   Admitting Physician Rolla Plate, DO  Outpatient Primary MD for the patient is Candise Bowens, MD  LOS - 1  Chief Complaint  Patient presents with   Threatened Miscarriage        Brief Narrative:  36 y.o. female with medical history significant of depression, recent cholecystectomy secondary to suspected gallstone pancreatitis, hypertension, recent miscarriage    -Assessment and Plan: 1) Acute alcoholic pancreatitis - Lipase 203 -Acute pancreatitis on CT -Cholecystectomy secondary to suspected gallstone pancreatitis earlier this year -CT shows no biliary dilatation -Holding off on right upper quadrant ultrasound at this time given recent cholecystectomy, no dilatation on CT -Okay to give clear liquid diet -IV fluids -Pain and nausea control with opiates and antiemetics  HTN (hypertension) -BP is not at goal  -continue atenolol and hydralazine -Stop lisinopril and HCTZ -Start Amlodipine -IV labetalol as needed elevated BP  Leukocytosis - UA pending -No infectious symptoms -Most likely reactive in the setting of acute pancreatitis and recent miscarriage Pro-Cal is not elevated -Continue to monitor  Miscarriage - Recently told she is having a miscarriage, approximately 2-[redacted] weeks along -Vaginal bleeding has resolved -Beta hCG is down to 6, repeat in the a.m. to confirm downtrending -Ultrasound OB shows no intrauterine pregnancy, evidence of failed gestation with complete abortion   Hypoalbuminemia - Likely secondary to poor p.o. intake given abdominal pain -When patient is able to tolerate diet, encourage p.o. intake with nutrient dense food choices -Trend with a.m. CMP  Alcohol use disorder - Advised on the importance of cessation especially in the setting of recurrent pancreatitis -Last drink 1 week ago, reports binge drinking on  the weekends with no history of withdrawal.   -Start CIWA along with multivitamin  Tobacco use disorder - Reports half a pack per day -Declines nicotine patch at this time -Counseled on the importance of cessation  Hypokalemia - 40 mEq potassium given p.o. in the ED -Continue 4 more rounds of IV potassium -Current potassium 2.6, trend in the a.m.  Disposition/Need for in-Hospital Stay- patient unable to be discharged at this time due to * -Acute pancreatitis requiring IV fluids and pain control and nausea control Status is: Inpatient   Disposition: The patient is from: Home              Anticipated d/c is to: Home              Anticipated d/c date is: 3 days              Patient currently is not medically stable to d/c. Barriers: Not Clinically Stable-   Code Status :  -  Code Status: Full Code   Family Communication:    NA (patient is alert, awake and coherent)   DVT Prophylaxis  :   - SCDs   heparin injection 5,000 Units Start: 09/16/21 2200 SCDs Start: 09/16/21 2034   Lab Results  Component Value Date   PLT 335 09/17/2021    Inpatient Medications  Scheduled Meds:  atenolol  50 mg Oral Daily   folic acid  1 mg Oral Daily   heparin  5,000 Units Subcutaneous Q8H   hydrALAZINE  25 mg Oral Q8H   lisinopril  20 mg Oral Daily   multivitamin with minerals  1 tablet Oral Daily   thiamine  100 mg Oral Daily   Or  thiamine  100 mg Intravenous Daily   Continuous Infusions:  sodium chloride 150 mL/hr at 09/17/21 1715   PRN Meds:.acetaminophen **OR** acetaminophen, LORazepam **OR** LORazepam, morphine injection, ondansetron **OR** ondansetron (ZOFRAN) IV, oxyCODONE   Anti-infectives (From admission, onward)    None         Subjective: Kimberly Patrick today has no fevers, ,  No chest pain,  -Nausea and abdominal pain persist No emesis  Objective: Vitals:   09/17/21 0515 09/17/21 1010 09/17/21 1054 09/17/21 1319  BP: (!) 181/98 (!) 172/98 (!) 165/99 (!)  172/108  Pulse: 61 68 69 73  Resp: 20  18 18   Temp: 98.5 F (36.9 C)  98.3 F (36.8 C) 98.5 F (36.9 C)  TempSrc:   Oral Oral  SpO2: 100%  97% 98%  Weight:        Intake/Output Summary (Last 24 hours) at 09/17/2021 1819 Last data filed at 09/17/2021 1600 Gross per 24 hour  Intake 1358.2 ml  Output --  Net 1358.2 ml   Filed Weights   09/16/21 2141  Weight: 106.3 kg    Physical Exam  Gen:- Awake Alert,  in no apparent distress  HEENT:- [place].AT, No sclera icterus Neck-Supple Neck,No JVD,.  Lungs-  CTAB , fair symmetrical air movement CV- S1, S2 normal, regular  Abd-  +ve B.Sounds, Abd Soft, epigastric and left upper quadrant tenderness without rebound or guarding, no suprapubic area tenderness Extremity/Skin:- No  edema, pedal pulses present  Psych-affect is appropriate, oriented x3 Neuro-no new focal deficits, no tremors  Data Reviewed: I have personally reviewed following labs and imaging studies  CBC: Recent Labs  Lab 09/16/21 1552 09/17/21 0528  WBC 20.1* 17.8*  NEUTROABS 18.2* 15.9*  HGB 12.9 12.9  HCT 38.4 39.4  MCV 88.3 90.4  PLT 387 335   Basic Metabolic Panel: Recent Labs  Lab 09/16/21 1552 09/17/21 0528  NA 142 139  K 2.6* 3.3*  CL 102 100  CO2 25 30  GLUCOSE 131* 142*  BUN 9 8  CREATININE 0.82 0.78  CALCIUM 8.2* 8.2*  MG  --  1.8   GFR: Estimated Creatinine Clearance: 120.9 mL/min (by C-G formula based on SCr of 0.78 mg/dL). Liver Function Tests: Recent Labs  Lab 09/16/21 1552 09/17/21 0528  AST 25 46*  ALT 18 18  ALKPHOS 76 101  BILITOT 0.4 0.6  PROT 7.4 6.7  ALBUMIN 3.2* 2.9*   Cardiac Enzymes: No results for input(s): "CKTOTAL", "CKMB", "CKMBINDEX", "TROPONINI" in the last 168 hours. BNP (last 3 results) No results for input(s): "PROBNP" in the last 8760 hours. HbA1C: No results for input(s): "HGBA1C" in the last 72 hours. Sepsis Labs: @LABRCNTIP (procalcitonin:4,lacticidven:4) )No results found for this or any previous  visit (from the past 240 hour(s)).    Radiology Studies: CT ABDOMEN WO CONTRAST  Result Date: 09/16/2021 CLINICAL DATA:  Abdominal pain, acute, nonlocalized IV contrast allergy EXAM: CT ABDOMEN WITHOUT CONTRAST TECHNIQUE: Multidetector CT imaging of the abdomen was performed following the standard protocol without IV contrast. RADIATION DOSE REDUCTION: This exam was performed according to the departmental dose-optimization program which includes automated exposure control, adjustment of the mA and/or kV according to patient size and/or use of iterative reconstruction technique. COMPARISON:  05/30/2021 FINDINGS: Lower chest: No acute abnormality.  Bibasilar atelectasis. Hepatobiliary: No focal liver abnormality is seen. Status post cholecystectomy. No biliary dilatation. Pancreas: Inflammatory stranding noted around the pancreas compatible with acute pancreatitis. No ductal dilatation or focal pancreatic abnormality. Spleen: No focal abnormality.  Normal size.  Adrenals/Urinary Tract: No adrenal abnormality. No focal renal abnormality. No stones or hydronephrosis. Stomach/Bowel: Stomach, visualized large and small bowel unremarkable. No evidence of bowel obstruction. Vascular/Lymphatic: No evidence of aneurysm or adenopathy. Other: No free fluid or free air. Musculoskeletal: No acute bony abnormality. IMPRESSION: Inflammatory stranding around the pancreas compatible with acute pancreatitis. Electronically Signed   By: Charlett Nose M.D.   On: 09/16/2021 18:18   US OB Comp < 14 Wks  Result Date: 09/16/2021 CLINICAL DATA:  Vaginal bleeding EXAM: OBSTETRIC <14 WK [place] AND TRANSVAGINAL OB US TECHNIQUE: Both transabdominal and transvaginal ultrasound examinations were performed for complete evaluation of the gestation as well as the maternal uterus, adnexal regions, and pelvic cul-de-sac. Transvaginal technique was performed to assess early pregnancy. COMPARISON:  None Available. FINDINGS: Intrauterine gestational  sac: None Yolk sac:  Not seen Embryo:  Not seen Cardiac Activity: Not seen Subchorionic hemorrhage:  None visualized. Maternal uterus/adnexae: Trace amount of free fluid may be physiological. There are no dominant adnexal masses. There is vascular flow in both adnexal regions seen color Doppler examination. IMPRESSION: There is no demonstrable intrauterine pregnancy. Findings suggest possible failed gestation with complete abortion. Less likely possibilities would include very early IUP or ectopic gestation. Serial HCG estimations and short-term follow-up sonogram may be considered. Electronically Signed   By: Ernie Avena M.D.   On: 09/16/2021 16:46   US OB Transvaginal  Result Date: 09/16/2021 CLINICAL DATA:  Vaginal bleeding EXAM: OBSTETRIC <14 WK [place] AND TRANSVAGINAL OB US TECHNIQUE: Both transabdominal and transvaginal ultrasound examinations were performed for complete evaluation of the gestation as well as the maternal uterus, adnexal regions, and pelvic cul-de-sac. Transvaginal technique was performed to assess early pregnancy. COMPARISON:  None Available. FINDINGS: Intrauterine gestational sac: None Yolk sac:  Not seen Embryo:  Not seen Cardiac Activity: Not seen Subchorionic hemorrhage:  None visualized. Maternal uterus/adnexae: Trace amount of free fluid may be physiological. There are no dominant adnexal masses. There is vascular flow in both adnexal regions seen color Doppler examination. IMPRESSION: There is no demonstrable intrauterine pregnancy. Findings suggest possible failed gestation with complete abortion. Less likely possibilities would include very early IUP or ectopic gestation. Serial HCG estimations and short-term follow-up sonogram may be considered. Electronically Signed   By: Ernie Avena M.D.   On: 09/16/2021 16:46     Scheduled Meds:  atenolol  50 mg Oral Daily   folic acid  1 mg Oral Daily   heparin  5,000 Units Subcutaneous Q8H   hydrALAZINE  25 mg Oral Q8H    lisinopril  20 mg Oral Daily   multivitamin with minerals  1 tablet Oral Daily   thiamine  100 mg Oral Daily   Or   thiamine  100 mg Intravenous Daily   Continuous Infusions:  sodium chloride 150 mL/hr at 09/17/21 1715     LOS: 1 day    Shon Hale M.D on 09/17/2021 at 6:19 PM  Go to [email or web address] - for contact info  [facility] - Office  [phone]  If 7PM-7AM, please contact night-coverage [email or web address] 09/17/2021, 6:19 PM

## 2021-09-18 DIAGNOSIS — I10 Essential (primary) hypertension: Secondary | ICD-10-CM | POA: Diagnosis not present

## 2021-09-18 DIAGNOSIS — K859 Acute pancreatitis without necrosis or infection, unspecified: Secondary | ICD-10-CM | POA: Diagnosis not present

## 2021-09-18 DIAGNOSIS — D72829 Elevated white blood cell count, unspecified: Secondary | ICD-10-CM | POA: Diagnosis not present

## 2021-09-18 DIAGNOSIS — E876 Hypokalemia: Secondary | ICD-10-CM | POA: Diagnosis not present

## 2021-09-18 LAB — URINALYSIS, ROUTINE W REFLEX MICROSCOPIC
Bacteria, UA: NONE SEEN
Bilirubin Urine: NEGATIVE
Glucose, UA: NEGATIVE mg/dL
Ketones, ur: 5 mg/dL — AB
Leukocytes,Ua: NEGATIVE
Nitrite: NEGATIVE
Protein, ur: NEGATIVE mg/dL
Specific Gravity, Urine: 1.009 (ref 1.005–1.030)
pH: 7 (ref 5.0–8.0)

## 2021-09-18 LAB — COMPREHENSIVE METABOLIC PANEL
ALT: 49 U/L — ABNORMAL HIGH (ref 0–44)
AST: 64 U/L — ABNORMAL HIGH (ref 15–41)
Albumin: 2.5 g/dL — ABNORMAL LOW (ref 3.5–5.0)
Alkaline Phosphatase: 218 U/L — ABNORMAL HIGH (ref 38–126)
Anion gap: 8 (ref 5–15)
BUN: 6 mg/dL (ref 6–20)
CO2: 26 mmol/L (ref 22–32)
Calcium: 8 mg/dL — ABNORMAL LOW (ref 8.9–10.3)
Chloride: 103 mmol/L (ref 98–111)
Creatinine, Ser: 0.71 mg/dL (ref 0.44–1.00)
GFR, Estimated: >60 mL/min (ref >60)
Glucose, Bld: 119 mg/dL — ABNORMAL HIGH (ref 70–99)
Potassium: 3.3 mmol/L — ABNORMAL LOW (ref 3.5–5.1)
Sodium: 137 mmol/L (ref 135–145)
Total Bilirubin: 0.8 mg/dL (ref 0.3–1.2)
Total Protein: 6.3 g/dL — ABNORMAL LOW (ref 6.5–8.1)

## 2021-09-18 LAB — CBC
HCT: 35.8 % — ABNORMAL LOW (ref 36.0–46.0)
Hemoglobin: 11.6 g/dL — ABNORMAL LOW (ref 12.0–15.0)
MCH: 29.4 pg (ref 26.0–34.0)
MCHC: 32.4 g/dL (ref 30.0–36.0)
MCV: 90.6 fL (ref 80.0–100.0)
Platelets: 277 10*3/uL (ref 150–400)
RBC: 3.95 MIL/uL (ref 3.87–5.11)
RDW: 15 % (ref 11.5–15.5)
WBC: 19 10*3/uL — ABNORMAL HIGH (ref 4.0–10.5)
nRBC: 0 % (ref 0.0–0.2)

## 2021-09-18 MED ORDER — POTASSIUM CHLORIDE CRYS ER 20 MEQ PO TBCR
40.0000 meq | EXTENDED_RELEASE_TABLET | ORAL | Status: AC
  Administered 2021-09-18 (×2): 40 meq via ORAL
  Filled 2021-09-18: qty 2

## 2021-09-18 MED ORDER — POTASSIUM CHLORIDE 10 MEQ/100ML IV SOLN: 10.0000 meq | INTRAVENOUS | Status: DC

## 2021-09-18 NOTE — TOC CAGE-AID Note (Signed)
Transition of Care [facility]) - CAGE-AID Screening   Patient Details  Name: Kimberly Patrick MRN: [id number] Date of Birth: 1985-09-15  [facility] of [facility] CM/SW Contact:    Sammi Stolarz C Tarpley-Carter, LCSWA Phone Number: 09/18/2021, 1:37 PM   Clinical Narrative: TOC CSW consulted with pt.  Pt participated in Cage-Aid.  Pt stated she does use ETOH.  Pt was offered resources, due to usage of ETOH.     Carmeron Heady Tarpley-Carter, MSW, LCSW-A Pronouns:  She/Her/Hers [facility]itions of Care Clinical Social Worker Direct Number:  [phone] [email or web address]    CAGE-AID Screening:    Have You Ever Felt You Ought to Cut Down on Your Drinking or Drug Use?: Yes Have People Annoyed You By [employer] Your Drinking Or Drug Use?: No Have You Felt Bad Or Guilty About Your Drinking Or Drug Use?: Yes Have You Ever Had a Drink or Used Drugs First Thing In The Morning to Steady Your Nerves or to Get Rid of a Hangover?: No CAGE-AID Score: 2  Substance Abuse Education Offered: Yes  Substance abuse interventions: Transport planner

## 2021-09-18 NOTE — Progress Notes (Addendum)
PROGRESS NOTE     Kimberly Patrick, is a 36 y.o. female, DOB - May 20, 1985, ZOX:[id number]  Admit date - 09/16/2021   Admitting Physician Lilyan Gilford, DO  Outpatient Primary MD for the patient is Mariel Kansky, MD  LOS - 2  Chief Complaint  Patient presents with   Threatened Miscarriage        Brief Narrative:  36 y.o. female with medical history significant of depression, recent cholecystectomy secondary to suspected gallstone pancreatitis, hypertension, recent miscarriage    -Assessment and Plan: 1) Acute Alcoholic Pancreatitis - Lipase 203 -Acute pancreatitis on CT -Cholecystectomy secondary to suspected gallstone pancreatitis earlier this year -CT shows no biliary dilatation -WBC 20.1 >>17.8 >> 19.0 -Elevated LFTs noted, T. bili WNL, patient is status post prior cholecystectomy -Pain and nausea control with opiates and antiemetics -try to advance to Full Liquid diet  HTN (hypertension) -BP is not at goal  -continue atenolol and hydralazine -Stop lisinopril and HCTZ -c/n Amlodipine -IV labetalol as needed elevated BP  Leukocytosis - UA pending -No infectious symptoms -Most likely reactive in the setting of acute pancreatitis and recent miscarriage Pro-Cal is not elevated -Continue to monitor  Miscarriage - Recently told she is having a miscarriage, approximately 2-[redacted] weeks along -Vaginal bleeding has resolved -Beta hCG is down to 6, repeat in the a.m. to confirm downtrending -Ultrasound OB shows no intrauterine pregnancy, evidence of failed gestation with complete abortion   Hypoalbuminemia - Likely secondary to poor p.o. intake given abdominal pain -When patient is able to tolerate diet, encourage p.o. intake with nutrient dense food choices -Trend with a.m. CMP  Alcohol use disorder - Advised on the importance of cessation especially in the setting of recurrent pancreatitis -Last drink 1 week ago, reports binge drinking on the weekends with  no history of withdrawal.   -Start CIWA along with multivitamin  Tobacco use disorder - Reports half a pack per day -Declines nicotine patch at this time -Counseled on the importance of cessation  Hypokalemia - Replace and recheck   Acute Anemia--- Hgb 11.6 down from 12.9, suspect hemodilutional  Disposition/Need for in-Hospital Stay- patient unable to be discharged at this time due to * -Acute pancreatitis requiring IV fluids and pain control and nausea control -Unable to discharge home due to well-controlled abdominal pain and awaiting better tolerance of oral intake   Status is: Inpatient   Disposition: The patient is from: Home              Anticipated d/c is to: Home              Anticipated d/c date is: 3 days              Patient currently is not medically stable to d/c. Barriers: Not Clinically Stable-   Code Status :  -  Code Status: Full Code   Family Communication:    NA (patient is alert, awake and coherent)   DVT Prophylaxis  :   - SCDs   heparin injection 5,000 Units Start: 09/16/21 2200 SCDs Start: 09/16/21 2034  Lab Results  Component Value Date   PLT 277 09/18/2021   Inpatient Medications  Scheduled Meds:  amLODipine  10 mg Oral Daily   atenolol  50 mg Oral Daily   folic acid  1 mg Oral Daily   heparin  5,000 Units Subcutaneous Q8H   hydrALAZINE  25 mg Oral Q8H   multivitamin with minerals  1 tablet Oral Daily   thiamine  100  mg Oral Daily   Or   thiamine  100 mg Intravenous Daily   Continuous Infusions:  sodium chloride 150 mL/hr at 09/18/21 0034   PRN Meds:.acetaminophen **OR** acetaminophen, labetalol, LORazepam **OR** LORazepam, morphine injection, ondansetron **OR** ondansetron (ZOFRAN) IV, oxyCODONE   Anti-infectives (From admission, onward)    None       Subjective: Kimberly Patrick today has no fevers, ,  No chest pain,  -Nausea and abdominal pain persist -has Nausea, No emesis Tolerating clear liquid  diet......  Objective: Vitals:   09/17/21 2027 09/17/21 2139 09/18/21 0450 09/18/21 1530  BP: (!) 185/106 (!) 163/90 (!) 144/79 (!) 149/82  Pulse: 75 73 78 77  Resp: 18  19 19   Temp: 97.9 F (36.6 C)  98 F (36.7 C) 98.1 F (36.7 C)  TempSrc: Oral   Oral  SpO2: 98%  100% 98%  Weight:        Intake/Output Summary (Last 24 hours) at 09/18/2021 1614 Last data filed at 09/18/2021 1300 Gross per 24 hour  Intake 720 ml  Output 2 ml  Net 718 ml   Filed Weights   09/16/21 2141  Weight: 106.3 kg   Physical Exam  Gen:- Awake Alert,  in no apparent distress  HEENT:- [place].AT, No sclera icterus Neck-Supple Neck,No JVD,.  Lungs-  CTAB , fair symmetrical air movement CV- S1, S2 normal, regular  Abd-  +ve B.Sounds, Abd Soft, epigastric and left upper quadrant tenderness without rebound or guarding, no suprapubic area tenderness Extremity/Skin:- No  edema, pedal pulses present  Psych-affect is appropriate, oriented x3 Neuro-no new focal deficits, no tremors  Data Reviewed: I have personally reviewed following labs and imaging studies  CBC: Recent Labs  Lab 09/16/21 1552 09/17/21 0528 09/18/21 0600  WBC 20.1* 17.8* 19.0*  NEUTROABS 18.2* 15.9*  --   HGB 12.9 12.9 11.6*  HCT 38.4 39.4 35.8*  MCV 88.3 90.4 90.6  PLT 387 335 277   Basic Metabolic Panel: Recent Labs  Lab 09/16/21 1552 09/17/21 0528 09/18/21 0600  NA 142 139 137  K 2.6* 3.3* 3.3*  CL 102 100 103  CO2 25 30 26   GLUCOSE 131* 142* 119*  BUN 9 8 6   CREATININE 0.82 0.78 0.71  CALCIUM 8.2* 8.2* 8.0*  MG  --  1.8  --    GFR: Estimated Creatinine Clearance: 120.9 mL/min (by C-G formula based on SCr of 0.71 mg/dL). Liver Function Tests: Recent Labs  Lab 09/16/21 1552 09/17/21 0528 09/18/21 0600  AST 25 46* 64*  ALT 18 18 49*  ALKPHOS 76 101 218*  BILITOT 0.4 0.6 0.8  PROT 7.4 6.7 6.3*  ALBUMIN 3.2* 2.9* 2.5*   Cardiac Enzymes: No results for input(s): "CKTOTAL", "CKMB", "CKMBINDEX", "TROPONINI" in  the last 168 hours. BNP (last 3 results) No results for input(s): "PROBNP" in the last 8760 hours. HbA1C: No results for input(s): "HGBA1C" in the last 72 hours. Sepsis Labs: @LABRCNTIP (procalcitonin:4,lacticidven:4) )No results found for this or any previous visit (from the past 240 hour(s)).    Radiology Studies: CT ABDOMEN WO CONTRAST  Result Date: 09/16/2021 CLINICAL DATA:  Abdominal pain, acute, nonlocalized IV contrast allergy EXAM: CT ABDOMEN WITHOUT CONTRAST TECHNIQUE: Multidetector CT imaging of the abdomen was performed following the standard protocol without IV contrast. RADIATION DOSE REDUCTION: This exam was performed according to the departmental dose-optimization program which includes automated exposure control, adjustment of the mA and/or kV according to patient size and/or use of iterative reconstruction technique. COMPARISON:  05/30/2021 FINDINGS: Lower chest:  No acute abnormality.  Bibasilar atelectasis. Hepatobiliary: No focal liver abnormality is seen. Status post cholecystectomy. No biliary dilatation. Pancreas: Inflammatory stranding noted around the pancreas compatible with acute pancreatitis. No ductal dilatation or focal pancreatic abnormality. Spleen: No focal abnormality.  Normal size. Adrenals/Urinary Tract: No adrenal abnormality. No focal renal abnormality. No stones or hydronephrosis. Stomach/Bowel: Stomach, visualized large and small bowel unremarkable. No evidence of bowel obstruction. Vascular/Lymphatic: No evidence of aneurysm or adenopathy. Other: No free fluid or free air. Musculoskeletal: No acute bony abnormality. IMPRESSION: Inflammatory stranding around the pancreas compatible with acute pancreatitis. Electronically Signed   By: Charlett Nose M.D.   On: 09/16/2021 18:18   US OB Comp < 14 Wks  Result Date: 09/16/2021 CLINICAL DATA:  Vaginal bleeding EXAM: OBSTETRIC <14 WK [place] AND TRANSVAGINAL OB US TECHNIQUE: Both transabdominal and transvaginal ultrasound  examinations were performed for complete evaluation of the gestation as well as the maternal uterus, adnexal regions, and pelvic cul-de-sac. Transvaginal technique was performed to assess early pregnancy. COMPARISON:  None Available. FINDINGS: Intrauterine gestational sac: None Yolk sac:  Not seen Embryo:  Not seen Cardiac Activity: Not seen Subchorionic hemorrhage:  None visualized. Maternal uterus/adnexae: Trace amount of free fluid may be physiological. There are no dominant adnexal masses. There is vascular flow in both adnexal regions seen color Doppler examination. IMPRESSION: There is no demonstrable intrauterine pregnancy. Findings suggest possible failed gestation with complete abortion. Less likely possibilities would include very early IUP or ectopic gestation. Serial HCG estimations and short-term follow-up sonogram may be considered. Electronically Signed   By: Ernie Avena M.D.   On: 09/16/2021 16:46   US OB Transvaginal  Result Date: 09/16/2021 CLINICAL DATA:  Vaginal bleeding EXAM: OBSTETRIC <14 WK [place] AND TRANSVAGINAL OB US TECHNIQUE: Both transabdominal and transvaginal ultrasound examinations were performed for complete evaluation of the gestation as well as the maternal uterus, adnexal regions, and pelvic cul-de-sac. Transvaginal technique was performed to assess early pregnancy. COMPARISON:  None Available. FINDINGS: Intrauterine gestational sac: None Yolk sac:  Not seen Embryo:  Not seen Cardiac Activity: Not seen Subchorionic hemorrhage:  None visualized. Maternal uterus/adnexae: Trace amount of free fluid may be physiological. There are no dominant adnexal masses. There is vascular flow in both adnexal regions seen color Doppler examination. IMPRESSION: There is no demonstrable intrauterine pregnancy. Findings suggest possible failed gestation with complete abortion. Less likely possibilities would include very early IUP or ectopic gestation. Serial HCG estimations and short-term  follow-up sonogram may be considered. Electronically Signed   By: Ernie Avena M.D.   On: 09/16/2021 16:46    Scheduled Meds:  amLODipine  10 mg Oral Daily   atenolol  50 mg Oral Daily   folic acid  1 mg Oral Daily   heparin  5,000 Units Subcutaneous Q8H   hydrALAZINE  25 mg Oral Q8H   multivitamin with minerals  1 tablet Oral Daily   thiamine  100 mg Oral Daily   Or   thiamine  100 mg Intravenous Daily   Continuous Infusions:  sodium chloride 150 mL/hr at 09/18/21 0034    LOS: 2 days   Shon Hale M.D on 09/18/2021 at 4:14 PM  Go to [email or web address] - for contact info  [facility] - Office  [phone]  If 7PM-7AM, please contact night-coverage [email or web address] 09/18/2021, 4:14 PM

## 2021-09-19 DIAGNOSIS — K859 Acute pancreatitis without necrosis or infection, unspecified: Secondary | ICD-10-CM | POA: Diagnosis not present

## 2021-09-19 DIAGNOSIS — I10 Essential (primary) hypertension: Secondary | ICD-10-CM | POA: Diagnosis not present

## 2021-09-19 DIAGNOSIS — F1090 Alcohol use, unspecified, uncomplicated: Secondary | ICD-10-CM | POA: Diagnosis not present

## 2021-09-19 DIAGNOSIS — D72829 Elevated white blood cell count, unspecified: Secondary | ICD-10-CM | POA: Diagnosis not present

## 2021-09-19 LAB — COMPREHENSIVE METABOLIC PANEL
ALT: 38 U/L (ref 0–44)
AST: 32 U/L (ref 15–41)
Albumin: 2.7 g/dL — ABNORMAL LOW (ref 3.5–5.0)
Alkaline Phosphatase: 246 U/L — ABNORMAL HIGH (ref 38–126)
Anion gap: 10 (ref 5–15)
BUN: <5 mg/dL — ABNORMAL LOW (ref 6–20)
CO2: 24 mmol/L (ref 22–32)
Calcium: 8.4 mg/dL — ABNORMAL LOW (ref 8.9–10.3)
Chloride: 102 mmol/L (ref 98–111)
Creatinine, Ser: 0.57 mg/dL (ref 0.44–1.00)
GFR, Estimated: >60 mL/min (ref >60)
Glucose, Bld: 106 mg/dL — ABNORMAL HIGH (ref 70–99)
Potassium: 3 mmol/L — ABNORMAL LOW (ref 3.5–5.1)
Sodium: 136 mmol/L (ref 135–145)
Total Bilirubin: 0.7 mg/dL (ref 0.3–1.2)
Total Protein: 6.8 g/dL (ref 6.5–8.1)

## 2021-09-19 LAB — CBC
HCT: 38.6 % (ref 36.0–46.0)
Hemoglobin: 12.8 g/dL (ref 12.0–15.0)
MCH: 30.2 pg (ref 26.0–34.0)
MCHC: 33.2 g/dL (ref 30.0–36.0)
MCV: 91 fL (ref 80.0–100.0)
Platelets: 273 10*3/uL (ref 150–400)
RBC: 4.24 MIL/uL (ref 3.87–5.11)
RDW: 15 % (ref 11.5–15.5)
WBC: 14.2 10*3/uL — ABNORMAL HIGH (ref 4.0–10.5)
nRBC: 0 % (ref 0.0–0.2)

## 2021-09-19 MED ORDER — POTASSIUM CHLORIDE CRYS ER 20 MEQ PO TBCR
40.0000 meq | EXTENDED_RELEASE_TABLET | ORAL | Status: AC
  Administered 2021-09-19 (×2): 40 meq via ORAL
  Filled 2021-09-19 (×2): qty 2

## 2021-09-19 MED ORDER — ATENOLOL 25 MG PO TABS
50.0000 mg | ORAL_TABLET | Freq: Two times a day (BID) | ORAL | Status: DC
  Administered 2021-09-19 – 2021-09-20 (×2): 50 mg via ORAL
  Filled 2021-09-19 (×2): qty 2

## 2021-09-19 NOTE — Plan of Care (Signed)
  Problem: Education: Goal: Knowledge of General Education information will improve Description: Including pain rating scale, medication(s)/side effects and non-pharmacologic comfort measures Outcome: Progressing   Problem: Health Behavior/Discharge Planning: Goal: Ability to manage health-related needs will improve Outcome: Progressing   Problem: Nutrition: Goal: Adequate nutrition will be maintained Outcome: Progressing   Problem: Coping: Goal: Level of anxiety will decrease Outcome: Progressing   Problem: Elimination: Goal: Will not experience complications related to bowel motility Outcome: Progressing Goal: Will not experience complications related to urinary retention Outcome: Progressing   Problem: Pain Managment: Goal: General experience of comfort will improve Outcome: Progressing   Problem: Safety: Goal: Ability to remain free from injury will improve Outcome: Progressing   

## 2021-09-19 NOTE — Progress Notes (Signed)
PROGRESS NOTE     Kimberly Patrick, is a 36 y.o. female, DOB - 01-18-1986, WLN:[id number]  Admit date - 09/16/2021   Admitting Physician Lilyan Gilford, DO  Outpatient Primary MD for the patient is Mariel Kansky, MD  LOS - 3  Chief Complaint  Patient presents with   Threatened Miscarriage        Brief Narrative:  36 y.o. female with medical history significant of depression, recent cholecystectomy secondary to suspected gallstone pancreatitis, hypertension, recent miscarriage    -Assessment and Plan: 1) Acute Alcoholic Pancreatitis - Lipase 203 -Acute pancreatitis on CT -Cholecystectomy secondary to suspected gallstone pancreatitis earlier this year -CT shows no biliary dilatation -WBC 20.1 >>17.8 >> 19.0>>14.2 -Elevated LFTs noted, T. bili WNL, patient is status post prior cholecystectomy -Pain and nausea control with opiates and antiemetics -Postprandial pain with full liquid diet unable to advance diet further at this time  HTN (hypertension) -BP is not at goal  -continue atenolol and hydralazine -Stop lisinopril and HCTZ -c/n Amlodipine -IV labetalol as needed elevated BP  Leukocytosis - UA not suggestive of UTI -No infectious symptoms -Most likely reactive in the setting of acute pancreatitis and recent miscarriage Pro-Cal is not elevated WBC trending down  Miscarriage - Recently told she is having a miscarriage, approximately 2-[redacted] weeks along -Vaginal bleeding has resolved -Beta hCG is down to 6, repeat in the a.m. to confirm downtrending -Ultrasound OB shows no intrauterine pregnancy, evidence of failed gestation with complete abortion   Hypoalbuminemia - Likely secondary to poor p.o. intake given abdominal pain -When patient is able to tolerate diet, encourage p.o. intake with nutrient dense food choices -Trend with a.m. CMP  Alcohol use disorder - Advised on the importance of cessation especially in the setting of recurrent  pancreatitis -Last drink 1 week ago, reports binge drinking on the weekends with no history of withdrawal.   -Start CIWA along with multivitamin  Tobacco use disorder - Reports half a pack per day -Declines nicotine patch at this time -Counseled on the importance of cessation  Hypokalemia - Replace and recheck   Disposition/Need for in-Hospital Stay- patient unable to be discharged at this time due to * -Acute pancreatitis requiring IV fluids and pain control and nausea control -Unable to discharge home due to well-controlled abdominal pain and awaiting better tolerance of oral intake   Status is: Inpatient   Disposition: The patient is from: Home              Anticipated d/c is to: Home              Anticipated d/c date is: 3 days              Patient currently is not medically stable to d/c. Barriers: Not Clinically Stable-   Code Status :  -  Code Status: Full Code   Family Communication:    NA (patient is alert, awake and coherent)   DVT Prophylaxis  :   - SCDs   heparin injection 5,000 Units Start: 09/16/21 2200 SCDs Start: 09/16/21 2034  Lab Results  Component Value Date   PLT 273 09/19/2021   Inpatient Medications  Scheduled Meds:  amLODipine  10 mg Oral Daily   atenolol  50 mg Oral BID   folic acid  1 mg Oral Daily   heparin  5,000 Units Subcutaneous Q8H   hydrALAZINE  25 mg Oral Q8H   multivitamin with minerals  1 tablet Oral Daily   thiamine  100  mg Oral Daily   Or   thiamine  100 mg Intravenous Daily   Continuous Infusions:  sodium chloride 150 mL/hr at 09/19/21 0651   PRN Meds:.acetaminophen **OR** acetaminophen, labetalol, LORazepam **OR** LORazepam, morphine injection, ondansetron **OR** ondansetron (ZOFRAN) IV, oxyCODONE   Anti-infectives (From admission, onward)    None       Subjective: Kimberly Patrick today has no fevers, ,  No chest pain,  Abdominal pain postprandial with full liquid diet -Continues to require pain  medications  Objective: Vitals:   09/18/21 1530 09/18/21 2037 09/19/21 0239 09/19/21 1319  BP: (!) 149/82 (!) 167/103  (!) 165/106  Pulse: 77 69  73  Resp: 19 20  18   Temp: 98.1 F (36.7 C) 99 F (37.2 C)  98.3 F (36.8 C)  TempSrc: Oral Oral  Oral  SpO2: 98% 98%  100%  Weight:      Height:   5' 6.5" (1.689 m)     Intake/Output Summary (Last 24 hours) at 09/19/2021 2021 Last data filed at 09/19/2021 1700 Gross per 24 hour  Intake 460 ml  Output 100 ml  Net 360 ml   Filed Weights   09/16/21 2141  Weight: 106.3 kg   Physical Exam  Gen:- Awake Alert,  in no apparent distress  HEENT:- [place].AT, No sclera icterus Neck-Supple Neck,No JVD,.  Lungs-  CTAB , fair symmetrical air movement CV- S1, S2 normal, regular  Abd-  +ve B.Sounds, Abd Soft, epigastric and left upper quadrant tenderness without rebound or guarding, no suprapubic area tenderness Extremity/Skin:- No  edema, pedal pulses present  Psych-affect is appropriate, oriented x3 Neuro-no new focal deficits, no tremors  Data Reviewed: I have personally reviewed following labs and imaging studies  CBC: Recent Labs  Lab 09/16/21 1552 09/17/21 0528 09/18/21 0600 09/19/21 0525  WBC 20.1* 17.8* 19.0* 14.2*  NEUTROABS 18.2* 15.9*  --   --   HGB 12.9 12.9 11.6* 12.8  HCT 38.4 39.4 35.8* 38.6  MCV 88.3 90.4 90.6 91.0  PLT 387 335 277 [id number]   Basic Metabolic Panel: Recent Labs  Lab 09/16/21 1552 09/17/21 0528 09/18/21 0600 09/19/21 0525  NA 142 139 137 136  K 2.6* 3.3* 3.3* 3.0*  CL 102 100 103 102  CO2 25 30 26 24   GLUCOSE 131* 142* 119* 106*  BUN 9 8 6  <5*  CREATININE 0.82 0.78 0.71 0.57  CALCIUM 8.2* 8.2* 8.0* 8.4*  MG  --  1.8  --   --    GFR: Estimated Creatinine Clearance: 120.9 mL/min (by C-G formula based on SCr of 0.57 mg/dL). Liver Function Tests: Recent Labs  Lab 09/16/21 1552 09/17/21 0528 09/18/21 0600 09/19/21 0525  AST 25 46* 64* 32  ALT 18 18 49* 38  ALKPHOS 76 101 218* 246*  BILITOT  0.4 0.6 0.8 0.7  PROT 7.4 6.7 6.3* 6.8  ALBUMIN 3.2* 2.9* 2.5* 2.7*   Cardiac Enzymes: No results for input(s): "CKTOTAL", "CKMB", "CKMBINDEX", "TROPONINI" in the last 168 hours. BNP (last 3 results) No results for input(s): "PROBNP" in the last 8760 hours. HbA1C: No results for input(s): "HGBA1C" in the last 72 hours. Sepsis Labs: @LABRCNTIP (procalcitonin:4,lacticidven:4) )No results found for this or any previous visit (from the past 240 hour(s)).    Radiology Studies: No results found.  Scheduled Meds:  amLODipine  10 mg Oral Daily   atenolol  50 mg Oral BID   folic acid  1 mg Oral Daily   heparin  5,000 Units Subcutaneous Q8H   hydrALAZINE  25 mg Oral Q8H   multivitamin with minerals  1 tablet Oral Daily   thiamine  100 mg Oral Daily   Or   thiamine  100 mg Intravenous Daily   Continuous Infusions:  sodium chloride 150 mL/hr at 09/19/21 0651    LOS: 3 days   Shon Hale M.D on 09/19/2021 at 8:21 PM  Go to [email or web address] - for contact info  [facility] - Office  [phone]  If 7PM-7AM, please contact night-coverage [email or web address] 09/19/2021, 8:21 PM

## 2021-09-19 NOTE — Plan of Care (Signed)
  Problem: Coping: Goal: Level of anxiety will decrease Outcome: Progressing   Problem: Pain Managment: Goal: General experience of comfort will improve Outcome: Progressing   Problem: Safety: Goal: Ability to remain free from injury will improve Outcome: Progressing   Problem: Skin Integrity: Goal: Risk for impaired skin integrity will decrease Outcome: Progressing   

## 2021-09-20 LAB — CBC
HCT: 34.2 % — ABNORMAL LOW (ref 36.0–46.0)
Hemoglobin: 11.5 g/dL — ABNORMAL LOW (ref 12.0–15.0)
MCH: 30.1 pg (ref 26.0–34.0)
MCHC: 33.6 g/dL (ref 30.0–36.0)
MCV: 89.5 fL (ref 80.0–100.0)
Platelets: 277 10*3/uL (ref 150–400)
RBC: 3.82 MIL/uL — ABNORMAL LOW (ref 3.87–5.11)
RDW: 15 % (ref 11.5–15.5)
WBC: 12.4 10*3/uL — ABNORMAL HIGH (ref 4.0–10.5)
nRBC: 0 % (ref 0.0–0.2)

## 2021-09-20 LAB — URINE CULTURE: Culture: NO GROWTH

## 2021-09-20 MED ORDER — LISINOPRIL-HYDROCHLOROTHIAZIDE 20-12.5 MG PO TABS: 1.0000 | ORAL_TABLET | Freq: Every day | ORAL | 3 refills | Status: AC

## 2021-09-20 MED ORDER — ONDANSETRON HCL 4 MG PO TABS: 4.0000 mg | ORAL_TABLET | Freq: Four times a day (QID) | ORAL | 0 refills | Status: DC | PRN

## 2021-09-20 MED ORDER — HYDRALAZINE HCL 50 MG PO TABS: 50.0000 mg | ORAL_TABLET | Freq: Three times a day (TID) | ORAL | 3 refills | Status: AC

## 2021-09-20 MED ORDER — ATENOLOL 50 MG PO TABS: 50.0000 mg | ORAL_TABLET | Freq: Every day | ORAL | 11 refills | Status: AC

## 2021-09-20 MED ORDER — ADULT MULTIVITAMIN W/MINERALS CH: 1.0000 | ORAL_TABLET | Freq: Every day | ORAL | 2 refills | Status: DC

## 2021-09-20 MED ORDER — POTASSIUM CHLORIDE CRYS ER 10 MEQ PO TBCR: 10.0000 meq | EXTENDED_RELEASE_TABLET | Freq: Every day | ORAL | 1 refills | Status: DC

## 2021-09-20 MED ORDER — ISOSORBIDE MONONITRATE ER 30 MG PO TB24: 30.0000 mg | ORAL_TABLET | Freq: Every day | ORAL | 3 refills | Status: AC

## 2021-09-20 MED ORDER — AMLODIPINE BESYLATE 10 MG PO TABS: 10.0000 mg | ORAL_TABLET | Freq: Every day | ORAL | 11 refills | Status: AC

## 2021-09-20 MED ORDER — SENNOSIDES-DOCUSATE SODIUM 8.6-50 MG PO TABS: 2.0000 | ORAL_TABLET | Freq: Two times a day (BID) | ORAL | 1 refills | Status: AC

## 2021-09-20 MED ORDER — ACETAMINOPHEN 325 MG PO TABS
650.0000 mg | ORAL_TABLET | Freq: Four times a day (QID) | ORAL | 0 refills | Status: AC | PRN
Start: 2021-09-20 — End: ?

## 2021-09-20 NOTE — Discharge Instructions (Signed)
1)Complete Abstinence from Alcohol advised 2)Please avoid greasy/fatty foods, bland food advised 3)Avoid ibuprofen/Advil/Aleve/Motrin/Goody Powders/Naproxen/BC powders/Meloxicam/Diclofenac/Indomethacin and other Nonsteroidal anti-inflammatory medications as these will make you more likely to bleed and can cause stomach ulcers, can also cause Kidney problems.  4)Please  note that there has been several changes to your blood pressure medications--- it is important that you take your blood pressure medications and quit smoking to reduce your risk for stroke and heart attacks, persistently high blood pressure could also lead your kidneys to fail 5) please follow-up with your primary care physician for recheck and reevaluation and repeat CBC and BMP blood test within a week

## 2021-09-20 NOTE — Discharge Summary (Signed)
Kimberly Patrick, is a 36 y.o. female  DOB December 27, 1985  MRN [id number].  Admission date:  09/16/2021  Admitting Physician  Rolla Plate, DO  Discharge Date:  09/20/2021   Primary MD  Candise Bowens, MD  Recommendations for primary care physician for things to follow:   1)Complete Abstinence from Alcohol advised 2)Please avoid greasy/fatty foods, bland food advised 3)Avoid ibuprofen/Advil/Aleve/Motrin/Goody Powders/Naproxen/BC powders/Meloxicam/Diclofenac/Indomethacin and other Nonsteroidal anti-inflammatory medications as these will make you more likely to bleed and can cause stomach ulcers, can also cause Kidney problems.  4)Please  note that there has been several changes to your blood pressure medications--- it is important that you take your blood pressure medications and quit smoking to reduce your risk for stroke and heart attacks, persistently high blood pressure could also lead your kidneys to fail 5) please follow-up with your primary care physician for recheck and reevaluation and repeat CBC and BMP blood test within a week  Admission Diagnosis  Acute pancreatitis [K85.90]   Discharge Diagnosis  Acute pancreatitis [K85.90]    Principal Problem:   Acute pancreatitis Active Problems:   HTN (hypertension)   Hypokalemia   Tobacco use disorder   Alcohol use disorder   Hypoalbuminemia   Miscarriage   Leukocytosis      Past Medical History:  Diagnosis Date   Depression    Gallstones and inflammation of gallbladder without obstruction 03/28/2012   Heart palpitations    Hypertension     Past Surgical History:  Procedure Laterality Date   CHOLECYSTECTOMY N/A 06/29/2021   Procedure: LAPAROSCOPIC CHOLECYSTECTOMY;  Surgeon: Rusty Aus, DO;  Location: [facility];  Service: General;  Laterality: N/A;      HPI  from the history and physical done on the day of admission:      HPI: Kimberly Patrick is a 36 y.o. female with medical history significant of depression, recent cholecystectomy secondary to suspected gallstone pancreatitis, hypertension, recent miscarriage, and more presents the ED with a chief complaint of abdominal pain.  Apparently upon presentation patient was flip-flopping all over the hospital bed screaming in pain.  She reports she has had abdominal pain and vomiting that started this AM.  The abdominal pain is diffuse and feels like contractions.  She reports the pain is severe although I had to wake her up when I walked into the room.  She had 2 doses of fentanyl, and 1 dose of Dilaudid in the ER and reports none of them have even touched her pain.  Patient reports that she had 1 episode of vomiting this a.m. and none since.  Her last meal was yesterday.  This pain does feel like her previous pancreatitis.  When she had nausea and vomiting it was clear and nonbloody.  Her last normal bowel movement was this morning.  The bowel movement temporarily improve the pain.  Patient reports her chief complaint is miscarriage.  She reports she was being told that she was having a miscarriage by the health department 4 days ago.  He  was told this because she was having a full.  After having a pregnancy test that was positive.  Her last normal period was the previous month.  She believes she was 2-[redacted] weeks along.  Her last drinking binge was 5 days ago.  She has had no vaginal bleeding today.  Patient denies any lightheadedness, chest pain, palpitations, paresthesias.  She does report mild occasional dyspnea.  No other complaints on review of systems.   Patient does smoke half a pack per day.  She binge drinks alcohol shots on the weekends.  Her last marijuana use was yesterday.  She does not use other illicit substances.  She is not vaccinated for COVID.  Patient is full code. Review of Systems: As mentioned in the history of present illness. All other systems reviewed  and are negative.     Hospital Course:   Brief Narrative:  36 y.o. female with medical history significant of depression, recent cholecystectomy secondary to suspected gallstone pancreatitis, hypertension, recent miscarriage     -Assessment and Plan: 1) Acute Alcoholic Pancreatitis - Lipase 203 -Acute pancreatitis on CT -Cholecystectomy secondary to suspected gallstone pancreatitis earlier this year -CT shows no biliary dilatation -WBC 20.1 >>17.8 >> 19.0>>14.2>>12.4 -AST and ALT normalized after being elevated initially, T. bili WNL, patient is status post prior cholecystectomy -Abdominal pain and nausea has improved significantly -Tolerated  full liquids and was advanced to soft diet   HTN (hypertension)-stage II -Discharge on amlodipine, lisinopril HCTZ, isosorbide/hydralazine combo and atenolol   Leukocytosis - UA not suggestive of UTI -No infectious symptoms -Most likely reactive in the setting of acute pancreatitis and recent miscarriage Pro-Cal is not elevated WBC trending down   Miscarriage - Recently told she is having a miscarriage, approximately 2-[redacted] weeks along -Vaginal bleeding has resolved -Beta hCG is down to 6, repeat in the a.m. to confirm downtrending -Ultrasound OB shows no intrauterine pregnancy, evidence of failed gestation with complete abortion   Alcohol use disorder - Advised on the importance of cessation especially in the setting of recurrent pancreatitis -Last drink 1 week ago, reports binge drinking on the weekends with no history of withdrawal.   Patient is not interested in alcohol rehab   Tobacco use disorder - Reports half a pack per day -Declines nicotine patch at this time -Counseled on the importance of cessation   Hypokalemia - Replace    Disposition: The patient is from: Home         Discharged home in stable condition   Follow UP--PCP as advised   Follow-up Information     Mariel Kansky, MD. Schedule an appointment as  soon as possible for a visit in 1 week(s).   Specialty: Family Medicine Why: Repeat CBC and BMP blood test -Repeat blood pressure check Contact information: [address] [place] [address] [phone]                 Diet and Activity recommendation:  As advised  Discharge Instructions    Discharge Instructions     Call MD for:  difficulty breathing, headache or visual disturbances   Complete by: As directed    Call MD for:  persistant dizziness or light-headedness   Complete by: As directed    Call MD for:  persistant nausea and vomiting   Complete by: As directed    Call MD for:  severe uncontrolled pain   Complete by: As directed    Call MD for:  temperature >100.4   Complete by: As  directed    Diet - low sodium heart healthy   Complete by: As directed    Bland food advised--- please avoid fatty and greasy foods   Discharge instructions   Complete by: As directed    1)Complete Abstinence from Alcohol advised 2)Please avoid greasy/fatty foods, bland food advised 3)Avoid ibuprofen/Advil/Aleve/Motrin/Goody Powders/Naproxen/BC powders/Meloxicam/Diclofenac/Indomethacin and other Nonsteroidal anti-inflammatory medications as these will make you more likely to bleed and can cause stomach ulcers, can also cause Kidney problems.  4)Please  note that there has been several changes to your blood pressure medications--- it is important that you take your blood pressure medications and quit smoking to reduce your risk for stroke and heart attacks, persistently high blood pressure could also lead your kidneys to fail 5) please follow-up with your primary care physician for recheck and reevaluation and repeat CBC and BMP blood test within a week   Increase activity slowly   Complete by: As directed          Discharge Medications     Allergies as of 09/20/2021       Reactions   Gadopentetate Hives, Other (See Comments), Swelling   Sneezing, coughing, hives, lip  swelling   Ivp Dye [iodinated Contrast Media] Hives, Shortness Of Breath        Medication List     STOP taking these medications    diphenhydrAMINE 50 MG tablet Commonly known as: BENADRYL   docusate sodium 100 MG capsule Commonly known as: Colace       TAKE these medications    acetaminophen 325 MG tablet Commonly known as: TYLENOL Take 2 tablets (650 mg total) by mouth every 6 (six) hours as needed for mild pain (or Fever >/= 101).   amLODipine 10 MG tablet Commonly known as: NORVASC Take 1 tablet (10 mg total) by mouth daily.   atenolol 50 MG tablet Commonly known as: Tenormin Take 1 tablet (50 mg total) by mouth daily. For BP   hydrALAZINE 50 MG tablet Commonly known as: APRESOLINE Take 1 tablet (50 mg total) by mouth 3 (three) times daily. What changed:  medication strength how much to take when to take this   isosorbide mononitrate 30 MG 24 hr tablet Commonly known as: IMDUR Take 1 tablet (30 mg total) by mouth daily.   lisinopril-hydrochlorothiazide 20-12.5 MG tablet Commonly known as: Zestoretic Take 1 tablet by mouth daily.   multivitamin with minerals Tabs tablet Take 1 tablet by mouth daily. Start taking on: September 21, 2021   ondansetron 4 MG tablet Commonly known as: ZOFRAN Take 1 tablet (4 mg total) by mouth every 6 (six) hours as needed for nausea.   potassium chloride 10 MEQ tablet Commonly known as: KLOR-CON M Take 1 tablet (10 mEq total) by mouth daily. Take only while Taking HCTZ/Hydrochlorthiazide What changed: additional instructions   senna-docusate 8.6-50 MG tablet Commonly known as: Senokot-S Take 2 tablets by mouth 2 (two) times daily.        Major procedures and Radiology Reports - PLEASE review detailed and final reports for all details, in brief -   CT ABDOMEN WO CONTRAST  Result Date: 09/16/2021 CLINICAL DATA:  Abdominal pain, acute, nonlocalized IV contrast allergy EXAM: CT ABDOMEN WITHOUT CONTRAST TECHNIQUE:  Multidetector CT imaging of the abdomen was performed following the standard protocol without IV contrast. RADIATION DOSE REDUCTION: This exam was performed according to the departmental dose-optimization program which includes automated exposure control, adjustment of the mA and/or kV according to patient size and/or use of  iterative reconstruction technique. COMPARISON:  05/30/2021 FINDINGS: Lower chest: No acute abnormality.  Bibasilar atelectasis. Hepatobiliary: No focal liver abnormality is seen. Status post cholecystectomy. No biliary dilatation. Pancreas: Inflammatory stranding noted around the pancreas compatible with acute pancreatitis. No ductal dilatation or focal pancreatic abnormality. Spleen: No focal abnormality.  Normal size. Adrenals/Urinary Tract: No adrenal abnormality. No focal renal abnormality. No stones or hydronephrosis. Stomach/Bowel: Stomach, visualized large and small bowel unremarkable. No evidence of bowel obstruction. Vascular/Lymphatic: No evidence of aneurysm or adenopathy. Other: No free fluid or free air. Musculoskeletal: No acute bony abnormality. IMPRESSION: Inflammatory stranding around the pancreas compatible with acute pancreatitis. Electronically Signed   By: Rolm Baptise M.D.   On: 09/16/2021 18:18   US OB Comp < 14 Wks  Result Date: 09/16/2021 CLINICAL DATA:  Vaginal bleeding EXAM: OBSTETRIC <14 WK [place] AND TRANSVAGINAL OB US TECHNIQUE: Both transabdominal and transvaginal ultrasound examinations were performed for complete evaluation of the gestation as well as the maternal uterus, adnexal regions, and pelvic cul-de-sac. Transvaginal technique was performed to assess early pregnancy. COMPARISON:  None Available. FINDINGS: Intrauterine gestational sac: None Yolk sac:  Not seen Embryo:  Not seen Cardiac Activity: Not seen Subchorionic hemorrhage:  None visualized. Maternal uterus/adnexae: Trace amount of free fluid may be physiological. There are no dominant adnexal masses.  There is vascular flow in both adnexal regions seen color Doppler examination. IMPRESSION: There is no demonstrable intrauterine pregnancy. Findings suggest possible failed gestation with complete abortion. Less likely possibilities would include very early IUP or ectopic gestation. Serial HCG estimations and short-term follow-up sonogram may be considered. Electronically Signed   By: Elmer Picker M.D.   On: 09/16/2021 16:46   US OB Transvaginal  Result Date: 09/16/2021 CLINICAL DATA:  Vaginal bleeding EXAM: OBSTETRIC <14 WK [place] AND TRANSVAGINAL OB US TECHNIQUE: Both transabdominal and transvaginal ultrasound examinations were performed for complete evaluation of the gestation as well as the maternal uterus, adnexal regions, and pelvic cul-de-sac. Transvaginal technique was performed to assess early pregnancy. COMPARISON:  None Available. FINDINGS: Intrauterine gestational sac: None Yolk sac:  Not seen Embryo:  Not seen Cardiac Activity: Not seen Subchorionic hemorrhage:  None visualized. Maternal uterus/adnexae: Trace amount of free fluid may be physiological. There are no dominant adnexal masses. There is vascular flow in both adnexal regions seen color Doppler examination. IMPRESSION: There is no demonstrable intrauterine pregnancy. Findings suggest possible failed gestation with complete abortion. Less likely possibilities would include very early IUP or ectopic gestation. Serial HCG estimations and short-term follow-up sonogram may be considered. Electronically Signed   By: Elmer Picker M.D.   On: 09/16/2021 16:46    Micro Results   Recent Results (from the past 240 hour(s))  Urine Culture     Status: None   Collection Time: 09/18/21  4:30 PM   Specimen: Urine, Clean Catch  Result Value Ref Range Status   Specimen Description   Final    URINE, CLEAN CATCH Performed at [facility], [address], [place], [place] [address]    Special Requests   Final    NONE Performed at [facility], [address], [place], [place] [address]    Culture   Final    NO GROWTH Performed at [facility], [place] [address], [place], [place] [address]    Report Status 09/20/2021 FINAL  Final    Today   Subjective    Nohemy Desa today has no new complaints Eating and drinking well -No emesis -No significant  abdominal pain after eating          Patient has been seen and examined prior to discharge   Objective   Blood pressure (!) 155/110, pulse 63, temperature 98.3 F (36.8 C), temperature source Oral, resp. rate 18, height 5' 6.5" (1.689 m), weight 106.3 kg, SpO2 100 %.   Intake/Output Summary (Last 24 hours) at 09/20/2021 1442 Last data filed at 09/20/2021 1300 Gross per 24 hour  Intake 5641.8 ml  Output 500 ml  Net 5141.8 ml    Exam Gen:- Awake Alert, no acute distress  HEENT:- [place].AT, No sclera icterus Neck-Supple Neck,No JVD,.  Lungs-  CTAB , good air movement bilaterally CV- S1, S2 normal, regular Abd-  +ve B.Sounds, Abd Soft, No significant tenderness,    Extremity/Skin:- No  edema,   good pulses Psych-affect is appropriate, oriented x3 Neuro-no new focal deficits, no tremors    Data Review   CBC w Diff:  Lab Results  Component Value Date   WBC 12.4 (H) 09/20/2021   HGB 11.5 (L) 09/20/2021   HGB 10.8 03/28/2012   HCT 34.2 (L) 09/20/2021   HCT 33 03/28/2012   PLT 277 09/20/2021   PLT 349 03/28/2012   LYMPHOPCT 6 09/17/2021   BANDSPCT 0 08/26/2006   MONOPCT 4 09/17/2021   EOSPCT 0 09/17/2021   BASOPCT 0 09/17/2021    CMP:  Lab Results  Component Value Date   NA 136 09/19/2021   K 3.0 (L) 09/19/2021   CL 102 09/19/2021   CO2 24 09/19/2021   BUN <5 (L) 09/19/2021   CREATININE 0.57 09/19/2021   PROT 6.8 09/19/2021   ALBUMIN 2.7 (L) 09/19/2021   BILITOT 0.7 09/19/2021   ALKPHOS 246 (H) 09/19/2021   AST 32 09/19/2021   ALT 38 09/19/2021  .  Total Discharge time is about 33 minutes  Shon Hale M.D on 09/20/2021 at 2:42  PM  Go to [email or web address] -  for contact info  [facility] - Office  [phone]

## 2021-09-20 NOTE — Plan of Care (Signed)

## 2021-09-20 NOTE — Progress Notes (Signed)
AVS given and reviewed with pt. Medications discussed. All questions answered to satisfaction. Work note from Shon Hale, MD provided to pt. Pt verbalized understanding of information given. Pt chose to walk off the unit escorted by staff member with all belongings.

## 2021-09-20 NOTE — Plan of Care (Signed)
  Problem: Pain Managment: Goal: General experience of comfort will improve Outcome: Progressing   Problem: Safety: Goal: Ability to remain free from injury will improve Outcome: Progressing   Problem: Skin Integrity: Goal: Risk for impaired skin integrity will decrease Outcome: Progressing   

## 2022-11-28 ENCOUNTER — Encounter (HOSPITAL_COMMUNITY): Payer: Self-pay | Admitting: Emergency Medicine

## 2022-11-28 ENCOUNTER — Inpatient Hospital Stay (HOSPITAL_COMMUNITY)
Admission: EM | Admit: 2022-11-28 | Discharge: 2022-12-01 | DRG: 440 | Disposition: A | Discharge status: Home / Self Care | Payer: Medicaid Other | Attending: Internal Medicine | Admitting: Internal Medicine

## 2022-11-28 ENCOUNTER — Emergency Department (HOSPITAL_COMMUNITY): Payer: Medicaid Other

## 2022-11-28 ENCOUNTER — Other Ambulatory Visit: Payer: Self-pay

## 2022-11-28 DIAGNOSIS — Z87891 Personal history of nicotine dependence: Secondary | ICD-10-CM

## 2022-11-28 DIAGNOSIS — Z79899 Other long term (current) drug therapy: Secondary | ICD-10-CM

## 2022-11-28 DIAGNOSIS — M25561 Pain in right knee: Secondary | ICD-10-CM

## 2022-11-28 DIAGNOSIS — M549 Dorsalgia, unspecified: Principal | ICD-10-CM

## 2022-11-28 DIAGNOSIS — Z91041 Radiographic dye allergy status: Secondary | ICD-10-CM | POA: Diagnosis not present

## 2022-11-28 DIAGNOSIS — Y901 Blood alcohol level of 20-39 mg/100 ml: Secondary | ICD-10-CM | POA: Diagnosis present

## 2022-11-28 DIAGNOSIS — I10 Essential (primary) hypertension: Secondary | ICD-10-CM | POA: Diagnosis present

## 2022-11-28 DIAGNOSIS — F1010 Alcohol abuse, uncomplicated: Secondary | ICD-10-CM | POA: Diagnosis present

## 2022-11-28 DIAGNOSIS — F419 Anxiety disorder, unspecified: Secondary | ICD-10-CM | POA: Diagnosis present

## 2022-11-28 DIAGNOSIS — K852 Alcohol induced acute pancreatitis without necrosis or infection: Principal | ICD-10-CM | POA: Diagnosis present

## 2022-11-28 DIAGNOSIS — Z823 Family history of stroke: Secondary | ICD-10-CM

## 2022-11-28 DIAGNOSIS — E876 Hypokalemia: Secondary | ICD-10-CM | POA: Diagnosis present

## 2022-11-28 DIAGNOSIS — K219 Gastro-esophageal reflux disease without esophagitis: Secondary | ICD-10-CM | POA: Diagnosis present

## 2022-11-28 DIAGNOSIS — Z8249 Family history of ischemic heart disease and other diseases of the circulatory system: Secondary | ICD-10-CM | POA: Diagnosis not present

## 2022-11-28 DIAGNOSIS — K859 Acute pancreatitis without necrosis or infection, unspecified: Secondary | ICD-10-CM | POA: Diagnosis present

## 2022-11-28 DIAGNOSIS — E669 Obesity, unspecified: Secondary | ICD-10-CM | POA: Diagnosis present

## 2022-11-28 DIAGNOSIS — Z833 Family history of diabetes mellitus: Secondary | ICD-10-CM | POA: Diagnosis not present

## 2022-11-28 DIAGNOSIS — Z803 Family history of malignant neoplasm of breast: Secondary | ICD-10-CM | POA: Diagnosis not present

## 2022-11-28 DIAGNOSIS — Z6834 Body mass index (BMI) 34.0-34.9, adult: Secondary | ICD-10-CM | POA: Diagnosis not present

## 2022-11-28 DIAGNOSIS — Z888 Allergy status to other drugs, medicaments and biological substances status: Secondary | ICD-10-CM

## 2022-11-28 LAB — COMPREHENSIVE METABOLIC PANEL
ALT: 22 U/L (ref 0–44)
AST: 28 U/L (ref 15–41)
Albumin: 3.2 g/dL — ABNORMAL LOW (ref 3.5–5.0)
Alkaline Phosphatase: 79 U/L (ref 38–126)
Anion gap: 18 — ABNORMAL HIGH (ref 5–15)
BUN: 10 mg/dL (ref 6–20)
CO2: 20 mmol/L — ABNORMAL LOW (ref 22–32)
Calcium: 8.3 mg/dL — ABNORMAL LOW (ref 8.9–10.3)
Chloride: 102 mmol/L (ref 98–111)
Creatinine, Ser: 0.79 mg/dL (ref 0.44–1.00)
GFR, Estimated: >60 mL/min (ref >60)
Glucose, Bld: 139 mg/dL — ABNORMAL HIGH (ref 70–99)
Potassium: 2.8 mmol/L — ABNORMAL LOW (ref 3.5–5.1)
Sodium: 140 mmol/L (ref 135–145)
Total Bilirubin: 0.5 mg/dL (ref 0.3–1.2)
Total Protein: 7.4 g/dL (ref 6.5–8.1)

## 2022-11-28 LAB — LIPID PANEL
Cholesterol: 83 mg/dL (ref 0–200)
HDL: 53 mg/dL (ref >40)
LDL Cholesterol: 3 mg/dL (ref 0–99)
Total CHOL/HDL Ratio: 1.6 RATIO
Triglycerides: 133 mg/dL (ref <150)
VLDL: 27 mg/dL (ref 0–40)

## 2022-11-28 LAB — CBC WITH DIFFERENTIAL/PLATELET
Abs Immature Granulocytes: 0.09 10*3/uL — ABNORMAL HIGH (ref 0.00–0.07)
Basophils Absolute: 0.1 10*3/uL (ref 0.0–0.1)
Basophils Relative: 0 %
Eosinophils Absolute: 0 10*3/uL (ref 0.0–0.5)
Eosinophils Relative: 0 %
HCT: 36.1 % (ref 36.0–46.0)
Hemoglobin: 12 g/dL (ref 12.0–15.0)
Immature Granulocytes: 1 %
Lymphocytes Relative: 9 %
Lymphs Abs: 1.5 10*3/uL (ref 0.7–4.0)
MCH: 29.6 pg (ref 26.0–34.0)
MCHC: 33.2 g/dL (ref 30.0–36.0)
MCV: 88.9 fL (ref 80.0–100.0)
Monocytes Absolute: 0.7 10*3/uL (ref 0.1–1.0)
Monocytes Relative: 4 %
Neutro Abs: 15.6 10*3/uL — ABNORMAL HIGH (ref 1.7–7.7)
Neutrophils Relative %: 86 %
Platelets: 400 10*3/uL (ref 150–400)
RBC: 4.06 MIL/uL (ref 3.87–5.11)
RDW: 15.8 % — ABNORMAL HIGH (ref 11.5–15.5)
WBC: 17.9 10*3/uL — ABNORMAL HIGH (ref 4.0–10.5)
nRBC: 0 % (ref 0.0–0.2)

## 2022-11-28 LAB — TROPONIN I (HIGH SENSITIVITY)
Troponin I (High Sensitivity): 2 ng/L (ref <18)
Troponin I (High Sensitivity): 3 ng/L (ref <18)

## 2022-11-28 LAB — MAGNESIUM: Magnesium: 1.4 mg/dL — ABNORMAL LOW (ref 1.7–2.4)

## 2022-11-28 LAB — ETHANOL: Alcohol, Ethyl (B): 32 mg/dL — ABNORMAL HIGH (ref <10)

## 2022-11-28 LAB — LIPASE, BLOOD: Lipase: 244 U/L — ABNORMAL HIGH (ref 11–51)

## 2022-11-28 LAB — HCG, SERUM, QUALITATIVE: Preg, Serum: NEGATIVE

## 2022-11-28 LAB — BRAIN NATRIURETIC PEPTIDE: B Natriuretic Peptide: 55 pg/mL (ref 0.0–100.0)

## 2022-11-28 MED ORDER — SODIUM CHLORIDE 0.9 % IV SOLN: INTRAVENOUS | Status: DC

## 2022-11-28 MED ORDER — POTASSIUM CHLORIDE CRYS ER 20 MEQ PO TBCR
40.0000 meq | EXTENDED_RELEASE_TABLET | Freq: Two times a day (BID) | ORAL | Status: AC
  Administered 2022-11-28 (×2): 40 meq via ORAL
  Filled 2022-11-28 (×2): qty 2

## 2022-11-28 MED ORDER — THIAMINE HCL 100 MG/ML IJ SOLN
100.0000 mg | Freq: Every day | INTRAMUSCULAR | Status: DC
  Administered 2022-11-28: 100 mg via INTRAVENOUS
  Filled 2022-11-28: qty 2

## 2022-11-28 MED ORDER — ONDANSETRON HCL 4 MG/2ML IJ SOLN: 4.0000 mg | Freq: Four times a day (QID) | INTRAMUSCULAR | Status: DC | PRN

## 2022-11-28 MED ORDER — MAGNESIUM SULFATE 2 GM/50ML IV SOLN
2.0000 g | Freq: Once | INTRAVENOUS | Status: AC
  Administered 2022-11-28: 2 g via INTRAVENOUS
  Filled 2022-11-28: qty 50

## 2022-11-28 MED ORDER — LORAZEPAM 2 MG/ML IJ SOLN: 1.0000 mg | INTRAMUSCULAR | Status: DC | PRN

## 2022-11-28 MED ORDER — ONDANSETRON HCL 4 MG PO TABS: 4.0000 mg | ORAL_TABLET | Freq: Four times a day (QID) | ORAL | Status: DC | PRN

## 2022-11-28 MED ORDER — ISOSORBIDE MONONITRATE ER 30 MG PO TB24
30.0000 mg | ORAL_TABLET | Freq: Every day | ORAL | Status: DC
  Administered 2022-11-28 – 2022-12-01 (×4): 30 mg via ORAL
  Filled 2022-11-28 (×4): qty 1

## 2022-11-28 MED ORDER — LORAZEPAM 1 MG PO TABS
1.0000 mg | ORAL_TABLET | ORAL | Status: DC | PRN
  Administered 2022-11-30: 1 mg via ORAL
  Filled 2022-11-28: qty 1

## 2022-11-28 MED ORDER — ACETAMINOPHEN 650 MG RE SUPP: 650.0000 mg | Freq: Four times a day (QID) | RECTAL | Status: DC | PRN

## 2022-11-28 MED ORDER — MORPHINE SULFATE (PF) 4 MG/ML IV SOLN
4.0000 mg | Freq: Once | INTRAVENOUS | Status: AC
  Administered 2022-11-28: 4 mg via INTRAVENOUS
  Filled 2022-11-28: qty 1

## 2022-11-28 MED ORDER — POTASSIUM CHLORIDE 10 MEQ/100ML IV SOLN
10.0000 meq | INTRAVENOUS | Status: AC
  Administered 2022-11-28 (×4): 10 meq via INTRAVENOUS
  Filled 2022-11-28 (×4): qty 100

## 2022-11-28 MED ORDER — HYDRALAZINE HCL 20 MG/ML IJ SOLN: 10.0000 mg | INTRAMUSCULAR | Status: DC | PRN

## 2022-11-28 MED ORDER — HYDROMORPHONE HCL 1 MG/ML IJ SOLN
1.0000 mg | INTRAMUSCULAR | Status: DC | PRN
  Administered 2022-11-28 – 2022-12-01 (×17): 1 mg via INTRAVENOUS
  Filled 2022-11-28 (×17): qty 1

## 2022-11-28 MED ORDER — HYDROMORPHONE HCL 1 MG/ML IJ SOLN
1.0000 mg | Freq: Once | INTRAMUSCULAR | Status: AC
  Administered 2022-11-28: 1 mg via INTRAVENOUS
  Filled 2022-11-28: qty 1

## 2022-11-28 MED ORDER — ATENOLOL 25 MG PO TABS
50.0000 mg | ORAL_TABLET | Freq: Every day | ORAL | Status: DC
  Administered 2022-11-28 – 2022-12-01 (×4): 50 mg via ORAL
  Filled 2022-11-28 (×4): qty 2

## 2022-11-28 MED ORDER — SODIUM CHLORIDE 0.9 % IV BOLUS
1000.0000 mL | Freq: Once | INTRAVENOUS | Status: AC
  Administered 2022-11-28: 1000 mL via INTRAVENOUS

## 2022-11-28 MED ORDER — HYDRALAZINE HCL 25 MG PO TABS
50.0000 mg | ORAL_TABLET | Freq: Three times a day (TID) | ORAL | Status: DC
  Administered 2022-11-28 – 2022-12-01 (×9): 50 mg via ORAL
  Filled 2022-11-28 (×9): qty 2

## 2022-11-28 MED ORDER — THIAMINE MONONITRATE 100 MG PO TABS
100.0000 mg | ORAL_TABLET | Freq: Every day | ORAL | Status: DC
  Administered 2022-11-29 – 2022-12-01 (×3): 100 mg via ORAL
  Filled 2022-11-28 (×3): qty 1

## 2022-11-28 MED ORDER — ENOXAPARIN SODIUM 40 MG/0.4ML IJ SOSY
40.0000 mg | PREFILLED_SYRINGE | INTRAMUSCULAR | Status: DC
  Administered 2022-11-28 – 2022-11-30 (×3): 40 mg via SUBCUTANEOUS
  Filled 2022-11-28 (×3): qty 0.4

## 2022-11-28 MED ORDER — ACETAMINOPHEN 325 MG PO TABS: 650.0000 mg | ORAL_TABLET | Freq: Four times a day (QID) | ORAL | Status: DC | PRN

## 2022-11-28 MED ORDER — ADULT MULTIVITAMIN W/MINERALS CH
1.0000 | ORAL_TABLET | Freq: Every day | ORAL | Status: DC
  Administered 2022-11-28 – 2022-12-01 (×4): 1 via ORAL
  Filled 2022-11-28 (×4): qty 1

## 2022-11-28 MED ORDER — ONDANSETRON HCL 4 MG/2ML IJ SOLN
4.0000 mg | Freq: Once | INTRAMUSCULAR | Status: AC
  Administered 2022-11-28: 4 mg via INTRAVENOUS
  Filled 2022-11-28: qty 2

## 2022-11-28 MED ORDER — OXYCODONE HCL 5 MG PO TABS
10.0000 mg | ORAL_TABLET | ORAL | Status: DC | PRN
  Administered 2022-11-30 (×3): 10 mg via ORAL
  Filled 2022-11-28 (×3): qty 2

## 2022-11-28 MED ORDER — AMLODIPINE BESYLATE 5 MG PO TABS
10.0000 mg | ORAL_TABLET | Freq: Every day | ORAL | Status: DC
  Administered 2022-11-28 – 2022-12-01 (×4): 10 mg via ORAL
  Filled 2022-11-28 (×4): qty 2

## 2022-11-28 MED ORDER — HYDROMORPHONE HCL 1 MG/ML IJ SOLN
0.5000 mg | INTRAMUSCULAR | Status: DC | PRN
  Administered 2022-11-28 (×2): 1 mg via INTRAVENOUS
  Filled 2022-11-28 (×2): qty 1

## 2022-11-28 MED ORDER — FOLIC ACID 1 MG PO TABS
1.0000 mg | ORAL_TABLET | Freq: Every day | ORAL | Status: DC
  Administered 2022-11-28 – 2022-12-01 (×4): 1 mg via ORAL
  Filled 2022-11-28 (×4): qty 1

## 2022-11-28 NOTE — H&P (Signed)
History and Physical    Kimberly Patrick KGM:[id number] DOB: June 30, 1985 DOA: 11/28/2022  PCP: Mariel Kansky, MD   Patient coming from: Home  Chief Complaint: Back pain  HPI: Kimberly Patrick is a 37 y.o. female with medical history significant for hypertension and alcohol abuse who presented to the ED with complaints of back pain that was quite severe.  She was awakened from her sleep this morning as a result of the pain and denies any injury/trauma.  The pain does not appear to radiate.  She has also had some ongoing right knee pain but this has been going on for several weeks.  She has also had some nausea and vomiting with 2 episodes of emesis.  Denies any diarrhea, fevers, or chills.   ED Course: Vital signs stable and patient afebrile.  Leukocytosis of 17,900 noted.  Potassium 2.8 and magnesium 1.4.  Lipase noted to be 244 and there is no transaminitis.  CT of the abdomen demonstrating findings of acute pancreatitis with no common bile duct dilation and noted prior history of cholecystectomy.  Review of Systems: Reviewed as noted above, otherwise negative.  Past Medical History:  Diagnosis Date   Depression    Gallstones and inflammation of gallbladder without obstruction 03/28/2012   Heart palpitations    Hypertension     Past Surgical History:  Procedure Laterality Date   CHOLECYSTECTOMY N/A 06/29/2021   Procedure: LAPAROSCOPIC CHOLECYSTECTOMY;  Surgeon: Lewie Chamber, DO;  Location: [facility];  Service: General;  Laterality: N/A;     reports that she has been smoking cigarettes. She has never used smokeless tobacco. She reports current alcohol use. She reports current drug use. Drug: Marijuana.  Allergies  Allergen Reactions   Gadopentetate Hives, Other (See Comments) and Swelling    Sneezing, coughing, hives, lip swelling   Ivp Dye [Iodinated Contrast Media] Hives and Shortness Of Breath    Family History  Problem Relation Age of Onset   Coronary  artery disease Mother    Hypertension Mother    Breast cancer Cousin    Diabetes Maternal Aunt    Stroke Maternal Aunt     Prior to Admission medications   Medication Sig Start Date End Date Taking? Authorizing Provider  amLODipine (NORVASC) 10 MG tablet Take 1 tablet (10 mg total) by mouth daily. 09/20/21 11/28/22 Yes Emokpae, Courage, MD  atenolol (TENORMIN) 50 MG tablet Take 1 tablet (50 mg total) by mouth daily. For BP 09/20/21 11/28/22 Yes Emokpae, Courage, MD  hydrALAZINE (APRESOLINE) 50 MG tablet Take 1 tablet (50 mg total) by mouth 3 (three) times daily. 09/20/21  Yes Emokpae, Courage, MD  lisinopril-hydrochlorothiazide (ZESTORETIC) 20-12.5 MG tablet Take 1 tablet by mouth daily. 09/20/21 11/28/22 Yes Emokpae, Courage, MD  potassium chloride (KLOR-CON M) 10 MEQ tablet Take 1 tablet (10 mEq total) by mouth daily. Take only while Taking HCTZ/Hydrochlorthiazide 09/20/21  Yes Emokpae, Courage, MD  acetaminophen (TYLENOL) 325 MG tablet Take 2 tablets (650 mg total) by mouth every 6 (six) hours as needed for mild pain (or Fever >/= 101). 09/20/21   Shon Hale, MD  isosorbide mononitrate (IMDUR) 30 MG 24 hr tablet Take 1 tablet (30 mg total) by mouth daily. 09/20/21 09/20/22  Shon Hale, MD  Multiple Vitamin (MULTIVITAMIN WITH MINERALS) TABS tablet Take 1 tablet by mouth daily. 09/21/21   Shon Hale, MD  ondansetron (ZOFRAN) 4 MG tablet Take 1 tablet (4 mg total) by mouth every 6 (six) hours as needed for nausea. 09/20/21  Shon Hale, MD    Physical Exam: Vitals:   11/28/22 0645 11/28/22 0645 11/28/22 0930 11/28/22 1102  BP:   (!) 168/94   Pulse: 82  97   Resp: (!) 24  (!) 24   Temp:  97.7 F (36.5 C)  97.8 F (36.6 C)  TempSrc:  Oral  Oral  SpO2: 100%  98%   Weight:      Height:        Constitutional: NAD, calm, comfortable Vitals:   11/28/22 0645 11/28/22 0645 11/28/22 0930 11/28/22 1102  BP:   (!) 168/94   Pulse: 82  97   Resp: (!) 24  (!) 24   Temp:  97.7 F  (36.5 C)  97.8 F (36.6 C)  TempSrc:  Oral  Oral  SpO2: 100%  98%   Weight:      Height:       Eyes: lids and conjunctivae normal Neck: normal, supple Respiratory: clear to auscultation bilaterally. Normal respiratory effort. No accessory muscle use.  Cardiovascular: Regular rate and rhythm, no murmurs. Abdomen: no tenderness, no distention. Bowel sounds positive.  Musculoskeletal:  No edema. Skin: no rashes, lesions, ulcers.  Psychiatric: Flat affect  Labs on Admission: I have personally reviewed following labs and imaging studies  CBC: Recent Labs  Lab 11/28/22 0658  WBC 17.9*  NEUTROABS 15.6*  HGB 12.0  HCT 36.1  MCV 88.9  PLT 400   Basic Metabolic Panel: Recent Labs  Lab 11/28/22 0658  NA 140  K 2.8*  CL 102  CO2 20*  GLUCOSE 139*  BUN 10  CREATININE 0.79  CALCIUM 8.3*  MG 1.4*   GFR: Estimated Creatinine Clearance: 116.9 mL/min (by C-G formula based on SCr of 0.79 mg/dL). Liver Function Tests: Recent Labs  Lab 11/28/22 0658  AST 28  ALT 22  ALKPHOS 79  BILITOT 0.5  PROT 7.4  ALBUMIN 3.2*   Recent Labs  Lab 11/28/22 0658  LIPASE 244*   No results for input(s): "AMMONIA" in the last 168 hours. Coagulation Profile: No results for input(s): "INR", "PROTIME" in the last 168 hours. Cardiac Enzymes: No results for input(s): "CKTOTAL", "CKMB", "CKMBINDEX", "TROPONINI" in the last 168 hours. BNP (last 3 results) No results for input(s): "PROBNP" in the last 8760 hours. HbA1C: No results for input(s): "HGBA1C" in the last 72 hours. CBG: No results for input(s): "GLUCAP" in the last 168 hours. Lipid Profile: No results for input(s): "CHOL", "HDL", "LDLCALC", "TRIG", "CHOLHDL", "LDLDIRECT" in the last 72 hours. Thyroid Function Tests: No results for input(s): "TSH", "T4TOTAL", "FREET4", "T3FREE", "THYROIDAB" in the last 72 hours. Anemia Panel: No results for input(s): "VITAMINB12", "FOLATE", "FERRITIN", "TIBC", "IRON", "RETICCTPCT" in the last 72  hours. Urine analysis:    Component Value Date/Time   COLORURINE YELLOW 09/18/2021 1630   APPEARANCEUR CLEAR 09/18/2021 1630   LABSPEC 1.009 09/18/2021 1630   PHURINE 7.0 09/18/2021 1630   GLUCOSEU NEGATIVE 09/18/2021 1630   HGBUR MODERATE (A) 09/18/2021 1630   BILIRUBINUR NEGATIVE 09/18/2021 1630   KETONESUR 5 (A) 09/18/2021 1630   PROTEINUR NEGATIVE 09/18/2021 1630   UROBILINOGEN 1.0 07/28/2012 0027   NITRITE NEGATIVE 09/18/2021 1630   LEUKOCYTESUR NEGATIVE 09/18/2021 1630    Radiological Exams on Admission: CT ABDOMEN PELVIS WO CONTRAST  Result Date: 11/28/2022 CLINICAL DATA:  Low back pain, abdominal pain EXAM: CT ABDOMEN AND PELVIS WITHOUT CONTRAST TECHNIQUE: Multidetector CT imaging of the abdomen and pelvis was performed following the standard protocol without IV contrast. RADIATION DOSE REDUCTION: This exam  was performed according to the departmental dose-optimization program which includes automated exposure control, adjustment of the mA and/or kV according to patient size and/or use of iterative reconstruction technique. COMPARISON:  09/16/2021, 05/30/2021 FINDINGS: Lower chest: No acute findings Hepatobiliary: No focal liver abnormality is seen. Status post cholecystectomy. No biliary dilatation. Pancreas: Edema noted around the pancreas compatible with acute pancreatitis. No ductal dilatation or focal pancreatic abnormality. Spleen: No focal abnormality.  Normal size. Adrenals/Urinary Tract: No adrenal abnormality. No focal renal abnormality. No stones or hydronephrosis. Urinary bladder is unremarkable. Stomach/Bowel: Stomach, large and small bowel grossly unremarkable. Normal appendix. Vascular/Lymphatic: No evidence of aneurysm or adenopathy. Shotty retroperitoneal lymph nodes, stable since prior study and none pathologically enlarged. Reproductive: Uterus and right adnexa unremarkable. Fat containing left ovarian mass measures up to 3.7 cm compatible with dermoid. Other: No free  fluid or free air. Musculoskeletal: No acute bony abnormality. IMPRESSION: Stranding noted around the pancreas compatible with acute pancreatitis. Slowly enlarging left ovarian dermoid, 3.7 cm currently compared to 3.2 cm previously. Electronically Signed   By: Charlett Nose M.D.   On: 11/28/2022 10:56   DG Chest 2 View  Result Date: 11/28/2022 CLINICAL DATA:  Chest pain Shortness of breath Right knee pain Back pain EXAM: CHEST - 2 VIEW COMPARISON:  10/13/2010 FINDINGS: Examination limited due to expiratory phase of imaging. Heart size within normal limits allowing for limitations of the study. No pulmonary vascular congestion. Lungs are clear. IMPRESSION: No acute cardiopulmonary process. Electronically Signed   By: Acquanetta Belling M.D.   On: 11/28/2022 08:12   DG Knee Complete 4 Views Right  Result Date: 11/28/2022 CLINICAL DATA:  Severe back and knee pain EXAM: RIGHT KNEE - COMPLETE 4+ VIEW COMPARISON:  None available FINDINGS: Moderate joint space loss and spurring of the medial, lateral, and patellofemoral compartments. Minimal knee joint effusion. No fracture or dislocation. IMPRESSION: Moderate tricompartmental osteoarthrosis, advanced for patient's age. Electronically Signed   By: Acquanetta Belling M.D.   On: 11/28/2022 08:10    EKG: Independently reviewed. SR 83bpm.  Assessment/Plan Principal Problem:   Acute pancreatitis Active Problems:   Hypokalemia   Obesity (BMI 30-39.9)   GERD (gastroesophageal reflux disease)   Alcohol abuse    Acute pancreatitis likely alcohol induced -Blood alcohol level of 32 noted -Check lipid panel -Repeat IV fluid bolus and maintain on aggressive IV fluid resuscitation -N.p.o. status -IV pain medications -Monitor lipase and liver enzyme trend -No findings of gallstones or common bile duct dilation on CT imaging, prior cholecystectomy noted  Hypokalemia/hypomagnesemia -Replete and reevaluate  Hypertension -Hold home medications and use hydralazine for  severe elevations  History of alcohol abuse -CIWA protocol  Tobacco/marijuana use -Counseled on cessation  Obesity -BMI 34.46  DVT prophylaxis: Lovenox Code Status: Full Family Communication: None at bedside Disposition Plan: Admit for acute pancreatitis Consults called: None Admission status: Inpatient, MedSurg  Severity of Illness: The appropriate patient status for this patient is INPATIENT. Inpatient status is judged to be reasonable and necessary in order to provide the required intensity of service to ensure the patient's safety. The patient's presenting symptoms, physical exam findings, and initial radiographic and laboratory data in the context of their chronic comorbidities is felt to place them at high risk for further clinical deterioration. Furthermore, it is not anticipated that the patient will be medically stable for discharge from the hospital within 2 midnights of admission.   * I certify that at the point of admission it is my clinical judgment that the  patient will require inpatient hospital care spanning beyond 2 midnights from the point of admission due to high intensity of service, high risk for further deterioration and high frequency of surveillance required.*   Rondal Vandevelde D Eleri Ruben DO [facility]  If 7PM-7AM, please contact night-coverage [email or web address]  11/28/2022, 11:24 AM

## 2022-11-28 NOTE — ED Provider Notes (Signed)
Signout from Dr. Oletta Cohn.  37 year old female here with acute diffuse back pain.  No numbness or weakness.  Also complaining of right knee pain.  She is pending labs and imaging.  Disposition per results of testing. Physical Exam  BP (!) 156/98   Pulse 82   Temp 97.7 F (36.5 C) (Oral)   Resp (!) 24   Ht 5\' 7"  (1.702 m)   Wt 99.8 kg   LMP  (LMP Unknown)   SpO2 100%   BMI 34.46 kg/m   Physical Exam  Procedures  Procedures  ED Course / MDM    Medical Decision Making Amount and/or Complexity of Data Reviewed Labs: ordered. Radiology: ordered.  Risk Prescription drug management.   Patient's lab work significant for elevated white count and elevated lipase.  CT showing stranding around the pancreas consistent with pancreatitis.  She also has a slowly enlarging left ovarian dermoid.  I reviewed this with the patient.  She said she is not having any abdominal pain.  She does endorse alcohol use but denies any excessive use.  She does feel her back pain and her knee pain are uncontrolled and does not feel like she can be discharged.  Have paged the hospitalist  11:13 AM.  Discussed with Dr. Sherryll Burger [facility] who will evaluate patient for admission.     Terrilee Files, MD 11/28/22 [phone]

## 2022-11-28 NOTE — ED Provider Notes (Signed)
[place] EMERGENCY DEPARTMENT AT [facility] Provider Note   CSN: [id number] Arrival date & time: 11/28/22  5284     History  Chief Complaint  Patient presents with   Back Pain    Kimberly Patrick is a 37 y.o. female.  Patient presents to the emergency department for evaluation of back pain.  Patient reports that she was awakened from sleep this morning by severe back pain.  She cannot delineate where it is, reports that her entire back hurts.  She denies any injury.  Pain does not seem to radiate.  No numbness, tingling, altered sensation or strength.  Patient does complain of right knee pain but this has been ongoing for weeks.       Home Medications Prior to Admission medications   Medication Sig Start Date End Date Taking? Authorizing Provider  amLODipine (NORVASC) 10 MG tablet Take 1 tablet (10 mg total) by mouth daily. 09/20/21 11/28/22 Yes Emokpae, Courage, MD  atenolol (TENORMIN) 50 MG tablet Take 1 tablet (50 mg total) by mouth daily. For BP 09/20/21 11/28/22 Yes Emokpae, Courage, MD  hydrALAZINE (APRESOLINE) 50 MG tablet Take 1 tablet (50 mg total) by mouth 3 (three) times daily. 09/20/21  Yes Emokpae, Courage, MD  lisinopril-hydrochlorothiazide (ZESTORETIC) 20-12.5 MG tablet Take 1 tablet by mouth daily. 09/20/21 11/28/22 Yes Emokpae, Courage, MD  potassium chloride (KLOR-CON M) 10 MEQ tablet Take 1 tablet (10 mEq total) by mouth daily. Take only while Taking HCTZ/Hydrochlorthiazide 09/20/21  Yes Emokpae, Courage, MD  acetaminophen (TYLENOL) 325 MG tablet Take 2 tablets (650 mg total) by mouth every 6 (six) hours as needed for mild pain (or Fever >/= 101). 09/20/21   Shon Hale, MD  isosorbide mononitrate (IMDUR) 30 MG 24 hr tablet Take 1 tablet (30 mg total) by mouth daily. 09/20/21 09/20/22  Shon Hale, MD  Multiple Vitamin (MULTIVITAMIN WITH MINERALS) TABS tablet Take 1 tablet by mouth daily. 09/21/21   Shon Hale, MD  ondansetron (ZOFRAN) 4 MG  tablet Take 1 tablet (4 mg total) by mouth every 6 (six) hours as needed for nausea. 09/20/21   Shon Hale, MD      Allergies    Gadopentetate and Ivp dye [iodinated contrast media]    Review of Systems   Review of Systems  Physical Exam Updated Vital Signs BP (!) 156/98   Pulse 82   Temp 97.7 F (36.5 C) (Oral)   Resp (!) 24   Ht 5\' 7"  (1.702 m)   Wt 99.8 kg   LMP  (LMP Unknown)   SpO2 100%   BMI 34.46 kg/m  Physical Exam Vitals and nursing note reviewed.  Constitutional:      General: She is in acute distress.     Appearance: She is well-developed.  HENT:     Head: Normocephalic and atraumatic.     Mouth/Throat:     Mouth: Mucous membranes are moist.  Eyes:     General: Vision grossly intact. Gaze aligned appropriately.     Extraocular Movements: Extraocular movements intact.     Conjunctiva/sclera: Conjunctivae normal.  Cardiovascular:     Rate and Rhythm: Regular rhythm. Tachycardia present.     Pulses: Normal pulses.     Heart sounds: Normal heart sounds, S1 normal and S2 normal. No murmur heard.    No friction rub. No gallop.  Pulmonary:     Effort: Pulmonary effort is normal. No respiratory distress.     Breath sounds: Normal breath sounds.  Abdominal:  General: Bowel sounds are normal.     Palpations: Abdomen is soft.     Tenderness: There is no abdominal tenderness. There is no guarding or rebound.     Hernia: No hernia is present.  Musculoskeletal:        General: No swelling.     Cervical back: Full passive range of motion without pain, normal range of motion and neck supple. No spinous process tenderness or muscular tenderness. Normal range of motion.     Right lower leg: No edema.     Left lower leg: No edema.  Skin:    General: Skin is warm and dry.     Capillary Refill: Capillary refill takes less than 2 seconds.     Findings: No ecchymosis, erythema, rash or wound.  Neurological:     General: No focal deficit present.     Mental  Status: She is alert and oriented to person, place, and time.     GCS: GCS eye subscore is 4. GCS verbal subscore is 5. GCS motor subscore is 6.     Cranial Nerves: Cranial nerves 2-12 are intact.     Sensory: Sensation is intact.     Motor: Motor function is intact.     Coordination: Coordination is intact.  Psychiatric:        Attention and Perception: Attention normal.        Mood and Affect: Mood is anxious (hyperventilating).        Speech: Speech normal.        Behavior: Behavior normal.     ED Results / Procedures / Treatments   Labs (all labs ordered are listed, but only abnormal results are displayed) Labs Reviewed - No data to display  EKG EKG Interpretation Date/Time:  Tuesday November 28 2022 06:41:47 EDT Ventricular Rate:  83 PR Interval:  142 QRS Duration:  90 QT Interval:  443 QTC Calculation: 521 R Axis:   38  Text Interpretation: Sinus rhythm Abnormal R-wave progression, early transition Prolonged QT interval Confirmed by Gilda Crease [phone]) on 11/28/2022 6:49:39 AM  Radiology No results found.  Procedures Procedures    Medications Ordered in ED Medications - No data to display  ED Course/ Medical Decision Making/ A&P                                 Medical Decision Making  Presents to the emergency department with multiple problems.  Patient's primary problem is back pain.  Patient reports awakening this morning with diffuse, severe back pain.  She has taken Tylenol without relief.  She comes to the ER by ambulance.  Patient anxious and hyperventilating at arrival.  He reports chest pain and shortness of breath, likely secondary to anxiety.  Will perform cardiac workup.  Back exam is nonfocal, endorses pain everywhere but there is no significant findings on exam.  Will provide analgesia and then reevaluate to see if there is a focal area of pain.  Patient also complains of right knee pain.  She did have a fall a couple of weeks ago but  thinks the pain was present prior to that.  She has had some swelling.  Will perform x-ray.  Signed out to oncoming ER physician to follow-up on results at shift change.        Final Clinical Impression(s) / ED Diagnoses Final diagnoses:  Acute bilateral back pain, unspecified back location    Rx /  DC Orders ED Discharge Orders     None         Arielis Leonhart, Canary Brim, MD 11/28/22 [phone]

## 2022-11-28 NOTE — ED Triage Notes (Signed)
Pt to ed via [facility]. Pt c/o right knee pain that has been ongoing. Back pain that started this morning. Pt given 500 mg tylenol by ems 0600. Pt took tylenol 500 or 625mg  prior to ems arrival. Pt asking for pain medication during triage.

## 2022-11-28 NOTE — ED Notes (Signed)
ED TO INPATIENT HANDOFF REPORT  ED Nurse Name and Phone #: Wandra Mannan, Paramedic [phone]  S Name/Age/Gender Kimberly Patrick 37 y.o. female Room/Bed: APA09/APA09  Code Status   Code Status: Prior  Home/SNF/Other Home Patient oriented to: self, place, time, and situation Is this baseline? Yes   Triage Complete: Triage complete  Chief Complaint Acute pancreatitis [K85.90]  Triage Note Pt to ed via [facility]. Pt c/o right knee pain that has been ongoing. Back pain that started this morning. Pt given 500 mg tylenol by ems 0600. Pt took tylenol 500 or 625mg  prior to ems arrival. Pt asking for pain medication during triage.    Allergies Allergies  Allergen Reactions   Gadopentetate Hives, Other (See Comments) and Swelling    Sneezing, coughing, hives, lip swelling   Ivp Dye [Iodinated Contrast Media] Hives and Shortness Of Breath    Level of Care/Admitting Diagnosis ED Disposition     ED Disposition  Admit   Condition  --   Comment  Hospital Area: [facility] [[id number]]  Level of Care: Med-Surg [16]  Covid Evaluation: Asymptomatic - no recent exposure (last 10 days) testing not required  Diagnosis: Acute pancreatitis [577.0.ICD-9-CM]  Admitting Physician: Erick Blinks [[id number]]  Attending Physician: Erick Blinks [[id number]]  Certification:: I certify this patient will need inpatient services for at least 2 midnights  Expected Medical Readiness: 11/30/2022          B Medical/Surgery History Past Medical History:  Diagnosis Date   Depression    Gallstones and inflammation of gallbladder without obstruction 03/28/2012   Heart palpitations    Hypertension    Past Surgical History:  Procedure Laterality Date   CHOLECYSTECTOMY N/A 06/29/2021   Procedure: LAPAROSCOPIC CHOLECYSTECTOMY;  Surgeon: Lewie Chamber, DO;  Location: [facility];  Service: General;  Laterality: N/A;     A IV Location/Drains/Wounds Patient Lines/Drains/Airways Status      Active Line/Drains/Airways     Name Placement date Placement time Site Days   Peripheral IV 11/28/22 20 G 1" Anterior;Proximal;Right Forearm 11/28/22  0728  Forearm  less than 1   Incision (Closed) 06/29/21 Abdomen 06/29/21  0916  -- 517   Incision - 4 Ports Abdomen Umbilicus Superior;Lateral;Left Lower;Left Lower;Right 06/29/21  0841  -- 517            Intake/Output Last 24 hours No intake or output data in the 24 hours ending 11/28/22 1120  Labs/Imaging Results for orders placed or performed during the hospital encounter of 11/28/22 (from the past 48 hour(s))  CBC with Differential/Platelet     Status: Abnormal   Collection Time: 11/28/22  6:58 AM  Result Value Ref Range   WBC 17.9 (H) 4.0 - 10.5 K/uL   RBC 4.06 3.87 - 5.11 MIL/uL   Hemoglobin 12.0 12.0 - 15.0 g/dL   HCT 30.8 65.7 - 84.6 %   MCV 88.9 80.0 - 100.0 fL   MCH 29.6 26.0 - 34.0 pg   MCHC 33.2 30.0 - 36.0 g/dL   RDW 96.2 (H) 95.2 - 84.1 %   Platelets 400 150 - 400 K/uL   nRBC 0.0 0.0 - 0.2 %   Neutrophils Relative % 86 %   Neutro Abs 15.6 (H) 1.7 - 7.7 K/uL   Lymphocytes Relative 9 %   Lymphs Abs 1.5 0.7 - 4.0 K/uL   Monocytes Relative 4 %   Monocytes Absolute 0.7 0.1 - 1.0 K/uL   Eosinophils Relative 0 %   Eosinophils Absolute 0.0 0.0 -  0.5 K/uL   Basophils Relative 0 %   Basophils Absolute 0.1 0.0 - 0.1 K/uL   Immature Granulocytes 1 %   Abs Immature Granulocytes 0.09 (H) 0.00 - 0.07 K/uL    Comment: Performed at [facility], [address] [place], [place] [address]  Comprehensive metabolic panel     Status: Abnormal   Collection Time: 11/28/22  6:58 AM  Result Value Ref Range   Sodium 140 135 - 145 mmol/L   Potassium 2.8 (L) 3.5 - 5.1 mmol/L   Chloride 102 98 - 111 mmol/L   CO2 20 (L) 22 - 32 mmol/L   Glucose, Bld 139 (H) 70 - 99 mg/dL    Comment: Glucose reference range applies only to samples taken after fasting for at least 8 hours.   BUN 10 6 - 20 mg/dL   Creatinine, Ser 8.84 0.44 -  1.00 mg/dL   Calcium 8.3 (L) 8.9 - 10.3 mg/dL   Total Protein 7.4 6.5 - 8.1 g/dL   Albumin 3.2 (L) 3.5 - 5.0 g/dL   AST 28 15 - 41 U/L   ALT 22 0 - 44 U/L   Alkaline Phosphatase 79 38 - 126 U/L   Total Bilirubin 0.5 0.3 - 1.2 mg/dL   GFR, Estimated >16 >60 mL/min    Comment: (NOTE) Calculated using the CKD-EPI Creatinine Equation (2021)    Anion gap 18 (H) 5 - 15    Comment: Performed at [facility], [address] [place], [place] [address]  Lipase, blood     Status: Abnormal   Collection Time: 11/28/22  6:58 AM  Result Value Ref Range   Lipase 244 (H) 11 - 51 U/L    Comment: Performed at [facility], [address], [place], [place] [address]  Troponin I (High Sensitivity)     Status: None   Collection Time: 11/28/22  6:58 AM  Result Value Ref Range   Troponin I (High Sensitivity) 2 <18 ng/L    Comment: (NOTE) Elevated high sensitivity troponin I (hsTnI) values and significant  changes across serial measurements may suggest ACS but many other  chronic and acute conditions are known to elevate hsTnI results.  Refer to the "Links" section for chest pain algorithms and additional  guidance. Performed at [facility], [address], [place], [place] [address]   Brain natriuretic peptide     Status: None   Collection Time: 11/28/22  6:58 AM  Result Value Ref Range   B Natriuretic Peptide 55.0 0.0 - 100.0 pg/mL    Comment: Performed at [facility], [address] [place], [place] [address]  Magnesium     Status: Abnormal   Collection Time: 11/28/22  6:58 AM  Result Value Ref Range   Magnesium 1.4 (L) 1.7 - 2.4 mg/dL    Comment: Performed at [facility], [address], [place], [place] [address]  Ethanol     Status: Abnormal   Collection Time: 11/28/22  6:58 AM  Result Value Ref Range   Alcohol, Ethyl (B) 32 (H) <10 mg/dL    Comment: (NOTE) Lowest detectable limit for serum alcohol is 10 mg/dL.  For medical purposes only. Performed at [facility], [address],  [place], [place] [address]   hCG, serum, qualitative     Status: None   Collection Time: 11/28/22  7:50 AM  Result Value Ref Range   Preg, Serum NEGATIVE NEGATIVE    Comment:        THE SENSITIVITY OF THIS METHODOLOGY IS >  10 mIU/mL. Performed at [facility], [address], [place], [place] [address]   Troponin I (High Sensitivity)     Status: None   Collection Time: 11/28/22  9:07 AM  Result Value Ref Range   Troponin I (High Sensitivity) 3 <18 ng/L    Comment: (NOTE) Elevated high sensitivity troponin I (hsTnI) values and significant  changes across serial measurements may suggest ACS but many other  chronic and acute conditions are known to elevate hsTnI results.  Refer to the "Links" section for chest pain algorithms and additional  guidance. Performed at [facility], [address], [place], [place] [address]    CT ABDOMEN PELVIS WO CONTRAST  Result Date: 11/28/2022 CLINICAL DATA:  Low back pain, abdominal pain EXAM: CT ABDOMEN AND PELVIS WITHOUT CONTRAST TECHNIQUE: Multidetector CT imaging of the abdomen and pelvis was performed following the standard protocol without IV contrast. RADIATION DOSE REDUCTION: This exam was performed according to the departmental dose-optimization program which includes automated exposure control, adjustment of the mA and/or kV according to patient size and/or use of iterative reconstruction technique. COMPARISON:  09/16/2021, 05/30/2021 FINDINGS: Lower chest: No acute findings Hepatobiliary: No focal liver abnormality is seen. Status post cholecystectomy. No biliary dilatation. Pancreas: Edema noted around the pancreas compatible with acute pancreatitis. No ductal dilatation or focal pancreatic abnormality. Spleen: No focal abnormality.  Normal size. Adrenals/Urinary Tract: No adrenal abnormality. No focal renal abnormality. No stones or hydronephrosis. Urinary bladder is unremarkable. Stomach/Bowel: Stomach, large and small bowel grossly unremarkable. Normal  appendix. Vascular/Lymphatic: No evidence of aneurysm or adenopathy. Shotty retroperitoneal lymph nodes, stable since prior study and none pathologically enlarged. Reproductive: Uterus and right adnexa unremarkable. Fat containing left ovarian mass measures up to 3.7 cm compatible with dermoid. Other: No free fluid or free air. Musculoskeletal: No acute bony abnormality. IMPRESSION: Stranding noted around the pancreas compatible with acute pancreatitis. Slowly enlarging left ovarian dermoid, 3.7 cm currently compared to 3.2 cm previously. Electronically Signed   By: Charlett Nose M.D.   On: 11/28/2022 10:56   DG Chest 2 View  Result Date: 11/28/2022 CLINICAL DATA:  Chest pain Shortness of breath Right knee pain Back pain EXAM: CHEST - 2 VIEW COMPARISON:  10/13/2010 FINDINGS: Examination limited due to expiratory phase of imaging. Heart size within normal limits allowing for limitations of the study. No pulmonary vascular congestion. Lungs are clear. IMPRESSION: No acute cardiopulmonary process. Electronically Signed   By: Acquanetta Belling M.D.   On: 11/28/2022 08:12   DG Knee Complete 4 Views Right  Result Date: 11/28/2022 CLINICAL DATA:  Severe back and knee pain EXAM: RIGHT KNEE - COMPLETE 4+ VIEW COMPARISON:  None available FINDINGS: Moderate joint space loss and spurring of the medial, lateral, and patellofemoral compartments. Minimal knee joint effusion. No fracture or dislocation. IMPRESSION: Moderate tricompartmental osteoarthrosis, advanced for patient's age. Electronically Signed   By: Acquanetta Belling M.D.   On: 11/28/2022 08:10    Pending Labs Unresulted Labs (From admission, onward)     Start     Ordered   11/28/22 1116  Lipid panel  Once,   R        11/28/22 1116   11/28/22 0652  Urinalysis, Routine w reflex microscopic -Urine, Clean Catch  ONCE - URGENT,   URGENT       Question:  Specimen Source  Answer:  Urine, Clean Catch   11/28/22 0651            Vitals/Pain Today's Vitals    11/28/22  4098 11/28/22 0843 11/28/22 0930 11/28/22 1102  BP:   (!) 168/94   Pulse:   97   Resp:   (!) 24   Temp:    97.8 F (36.6 C)  TempSrc:    Oral  SpO2:   98%   Weight:      Height:      PainSc: 10-Worst pain ever 10-Worst pain ever      Isolation Precautions No active isolations  Medications Medications  sodium chloride 0.9 % bolus 1,000 mL (has no administration in time range)  potassium chloride 10 mEq in 100 mL IVPB (has no administration in time range)  potassium chloride SA (KLOR-CON M) CR tablet 40 mEq (has no administration in time range)  0.9 %  sodium chloride infusion (has no administration in time range)  sodium chloride 0.9 % bolus 1,000 mL (0 mLs Intravenous Stopped 11/28/22 0843)  morphine (PF) 4 MG/ML injection 4 mg (4 mg Intravenous Given 11/28/22 0734)  ondansetron (ZOFRAN) injection 4 mg (4 mg Intravenous Given 11/28/22 0734)  HYDROmorphone (DILAUDID) injection 1 mg (1 mg Intravenous Given 11/28/22 0803)  magnesium sulfate IVPB 2 g 50 mL (0 g Intravenous Stopped 11/28/22 0907)  HYDROmorphone (DILAUDID) injection 1 mg (1 mg Intravenous Given 11/28/22 1115)    Mobility walks     Focused Assessments Cardiac Assessment Handoff:    No results found for: "CKTOTAL", "CKMB", "CKMBINDEX", "TROPONINI" No results found for: "DDIMER" Does the Patient currently have chest pain? No    R Recommendations: See Admitting Provider Note  Report given to:   Additional Notes: Pain primarily in back, unable to localize. Acute pancreatitis. 20ga RAC. Ambulatory, Aox4.

## 2022-11-29 DIAGNOSIS — K859 Acute pancreatitis without necrosis or infection, unspecified: Secondary | ICD-10-CM | POA: Diagnosis not present

## 2022-11-29 LAB — COMPREHENSIVE METABOLIC PANEL
ALT: 18 U/L (ref 0–44)
AST: 22 U/L (ref 15–41)
Albumin: 2.9 g/dL — ABNORMAL LOW (ref 3.5–5.0)
Alkaline Phosphatase: 78 U/L (ref 38–126)
Anion gap: 8 (ref 5–15)
BUN: 9 mg/dL (ref 6–20)
CO2: 24 mmol/L (ref 22–32)
Calcium: 7.8 mg/dL — ABNORMAL LOW (ref 8.9–10.3)
Chloride: 103 mmol/L (ref 98–111)
Creatinine, Ser: 0.68 mg/dL (ref 0.44–1.00)
GFR, Estimated: >60 mL/min (ref >60)
Glucose, Bld: 128 mg/dL — ABNORMAL HIGH (ref 70–99)
Potassium: 3.6 mmol/L (ref 3.5–5.1)
Sodium: 135 mmol/L (ref 135–145)
Total Bilirubin: 0.6 mg/dL (ref 0.3–1.2)
Total Protein: 6.8 g/dL (ref 6.5–8.1)

## 2022-11-29 LAB — CBC
HCT: 33.5 % — ABNORMAL LOW (ref 36.0–46.0)
Hemoglobin: 10.8 g/dL — ABNORMAL LOW (ref 12.0–15.0)
MCH: 29.5 pg (ref 26.0–34.0)
MCHC: 32.2 g/dL (ref 30.0–36.0)
MCV: 91.5 fL (ref 80.0–100.0)
Platelets: 296 10*3/uL (ref 150–400)
RBC: 3.66 MIL/uL — ABNORMAL LOW (ref 3.87–5.11)
RDW: 15.9 % — ABNORMAL HIGH (ref 11.5–15.5)
WBC: 19.7 10*3/uL — ABNORMAL HIGH (ref 4.0–10.5)
nRBC: 0 % (ref 0.0–0.2)

## 2022-11-29 LAB — MAGNESIUM: Magnesium: 1.8 mg/dL (ref 1.7–2.4)

## 2022-11-29 LAB — HIV ANTIBODY (ROUTINE TESTING W REFLEX): HIV Screen 4th Generation wRfx: NONREACTIVE

## 2022-11-29 LAB — LIPASE, BLOOD: Lipase: 94 U/L — ABNORMAL HIGH (ref 11–51)

## 2022-11-29 NOTE — Progress Notes (Signed)
PROGRESS NOTE    Kimberly Patrick  WUJ:[id number] DOB: 11-06-1985 DOA: 11/28/2022 PCP: Kimberly Kansky, MD   Brief Narrative:    Kimberly Patrick is a 37 y.o. female with medical history significant for hypertension and alcohol abuse who presented to the ED with complaints of back pain that was quite severe.  She had some associated nausea and vomiting and was admitted with acute pancreatitis likely alcohol induced.  Assessment & Plan:   Principal Problem:   Acute pancreatitis Active Problems:   Hypokalemia   Obesity (BMI 30-39.9)   GERD (gastroesophageal reflux disease)   Alcohol abuse  Assessment and Plan:   Acute pancreatitis likely alcohol induced -Blood alcohol level of 32 noted -Lipid panel within normal limits with no elevation of triglycerides -Continue aggressive IV fluids -N.p.o. status -IV pain medications -Monitor lipase and liver enzyme trend which is downward trending -No findings of gallstones or common bile duct dilation on CT imaging, prior cholecystectomy noted   Hypertension -Continue home medications and use hydralazine for severe elevations   History of alcohol abuse -CIWA protocol   Tobacco/marijuana use -Counseled on cessation   Obesity -BMI 34.46   DVT prophylaxis:Lovenox Code Status: Full Family Communication: None at bedside Disposition Plan:  Status is: Inpatient Remains inpatient appropriate because: Need for IV medications  Consultants:  None  Procedures:  None  Antimicrobials:  None   Subjective: Patient seen and evaluated today with pain rated 7/10 and is slightly improved from yesterday.  She denies any further nausea or vomiting.  Does not feel like having anything to eat.  Objective: Vitals:   11/28/22 1415 11/28/22 2031 11/28/22 2328 11/29/22 0455  BP: (!) 173/102 (!) 175/103 (!) 170/98 (!) 151/86  Pulse: 91 79 76 72  Resp:  20  20  Temp:  97.7 F (36.5 C) 97.8 F (36.6 C) 97.8 F (36.6 C)  TempSrc:   Oral Oral Oral  SpO2:  92% 94% 96%  Weight:      Height:        Intake/Output Summary (Last 24 hours) at 11/29/2022 1010 Last data filed at 11/29/2022 0300 Gross per 24 hour  Intake 0 ml  Output --  Net 0 ml   Filed Weights   11/28/22 0642 11/28/22 1238  Weight: 99.8 kg 99.5 kg    Examination:  General exam: Appears calm and comfortable  Respiratory system: Clear to auscultation. Respiratory effort normal. Cardiovascular system: S1 & S2 heard, RRR.  Gastrointestinal system: Abdomen is soft Central nervous system: Alert and awake Extremities: No edema Skin: No significant lesions noted Psychiatry: Flat affect.    Data Reviewed: I have personally reviewed following labs and imaging studies  CBC: Recent Labs  Lab 11/28/22 0658 11/29/22 0429  WBC 17.9* 19.7*  NEUTROABS 15.6*  --   HGB 12.0 10.8*  HCT 36.1 33.5*  MCV 88.9 91.5  PLT 400 296   Basic Metabolic Panel: Recent Labs  Lab 11/28/22 0658 11/29/22 0429  NA 140 135  K 2.8* 3.6  CL 102 103  CO2 20* 24  GLUCOSE 139* 128*  BUN 10 9  CREATININE 0.79 0.68  CALCIUM 8.3* 7.8*  MG 1.4* 1.8   GFR: Estimated Creatinine Clearance: 116.7 mL/min (by C-G formula based on SCr of 0.68 mg/dL). Liver Function Tests: Recent Labs  Lab 11/28/22 0658 11/29/22 0429  AST 28 22  ALT 22 18  ALKPHOS 79 78  BILITOT 0.5 0.6  PROT 7.4 6.8  ALBUMIN 3.2* 2.9*   Recent Labs  Lab 11/28/22 0658 11/29/22 0429  LIPASE 244* 94*   No results for input(s): "AMMONIA" in the last 168 hours. Coagulation Profile: No results for input(s): "INR", "PROTIME" in the last 168 hours. Cardiac Enzymes: No results for input(s): "CKTOTAL", "CKMB", "CKMBINDEX", "TROPONINI" in the last 168 hours. BNP (last 3 results) No results for input(s): "PROBNP" in the last 8760 hours. HbA1C: No results for input(s): "HGBA1C" in the last 72 hours. CBG: No results for input(s): "GLUCAP" in the last 168 hours. Lipid Profile: Recent Labs     11/28/22 0907  CHOL 83  HDL 53  LDLCALC 3  TRIG 133  CHOLHDL 1.6   Thyroid Function Tests: No results for input(s): "TSH", "T4TOTAL", "FREET4", "T3FREE", "THYROIDAB" in the last 72 hours. Anemia Panel: No results for input(s): "VITAMINB12", "FOLATE", "FERRITIN", "TIBC", "IRON", "RETICCTPCT" in the last 72 hours. Sepsis Labs: No results for input(s): "PROCALCITON", "LATICACIDVEN" in the last 168 hours.  No results found for this or any previous visit (from the past 240 hour(s)).       Radiology Studies: CT ABDOMEN PELVIS WO CONTRAST  Result Date: 11/28/2022 CLINICAL DATA:  Low back pain, abdominal pain EXAM: CT ABDOMEN AND PELVIS WITHOUT CONTRAST TECHNIQUE: Multidetector CT imaging of the abdomen and pelvis was performed following the standard protocol without IV contrast. RADIATION DOSE REDUCTION: This exam was performed according to the departmental dose-optimization program which includes automated exposure control, adjustment of the mA and/or kV according to patient size and/or use of iterative reconstruction technique. COMPARISON:  09/16/2021, 05/30/2021 FINDINGS: Lower chest: No acute findings Hepatobiliary: No focal liver abnormality is seen. Status post cholecystectomy. No biliary dilatation. Pancreas: Edema noted around the pancreas compatible with acute pancreatitis. No ductal dilatation or focal pancreatic abnormality. Spleen: No focal abnormality.  Normal size. Adrenals/Urinary Tract: No adrenal abnormality. No focal renal abnormality. No stones or hydronephrosis. Urinary bladder is unremarkable. Stomach/Bowel: Stomach, large and small bowel grossly unremarkable. Normal appendix. Vascular/Lymphatic: No evidence of aneurysm or adenopathy. Shotty retroperitoneal lymph nodes, stable since prior study and none pathologically enlarged. Reproductive: Uterus and right adnexa unremarkable. Fat containing left ovarian mass measures up to 3.7 cm compatible with dermoid. Other: No free fluid  or free air. Musculoskeletal: No acute bony abnormality. IMPRESSION: Stranding noted around the pancreas compatible with acute pancreatitis. Slowly enlarging left ovarian dermoid, 3.7 cm currently compared to 3.2 cm previously. Electronically Signed   By: Charlett Nose M.D.   On: 11/28/2022 10:56   DG Chest 2 View  Result Date: 11/28/2022 CLINICAL DATA:  Chest pain Shortness of breath Right knee pain Back pain EXAM: CHEST - 2 VIEW COMPARISON:  10/13/2010 FINDINGS: Examination limited due to expiratory phase of imaging. Heart size within normal limits allowing for limitations of the study. No pulmonary vascular congestion. Lungs are clear. IMPRESSION: No acute cardiopulmonary process. Electronically Signed   By: Acquanetta Belling M.D.   On: 11/28/2022 08:12   DG Knee Complete 4 Views Right  Result Date: 11/28/2022 CLINICAL DATA:  Severe back and knee pain EXAM: RIGHT KNEE - COMPLETE 4+ VIEW COMPARISON:  None available FINDINGS: Moderate joint space loss and spurring of the medial, lateral, and patellofemoral compartments. Minimal knee joint effusion. No fracture or dislocation. IMPRESSION: Moderate tricompartmental osteoarthrosis, advanced for patient's age. Electronically Signed   By: Acquanetta Belling M.D.   On: 11/28/2022 08:10        Scheduled Meds:  amLODipine  10 mg Oral Daily   atenolol  50 mg Oral Daily   enoxaparin (LOVENOX)  injection  40 mg Subcutaneous Q24H   folic acid  1 mg Oral Daily   hydrALAZINE  50 mg Oral TID   isosorbide mononitrate  30 mg Oral Daily   multivitamin with minerals  1 tablet Oral Daily   thiamine  100 mg Oral Daily   Or   thiamine  100 mg Intravenous Daily   Continuous Infusions:  sodium chloride 125 mL/hr at 11/29/22 0309     LOS: 1 day    Time spent: 35 minutes    Nichalas Coin Hoover Brunette, DO [facility]  If 7PM-7AM, please contact night-coverage [email or web address] 11/29/2022, 10:10 AM

## 2022-11-29 NOTE — Progress Notes (Signed)
   11/29/22 1412  TOC Brief Assessment  Insurance and Status Reviewed  Patient has primary care physician Yes  Home environment has been reviewed from home  Prior level of function: independent  Prior/Current Home Services No current home services  Social Determinants of Health Reivew SDOH reviewed no interventions necessary  Readmission risk has been reviewed Yes  Transition of care needs no transition of care needs at this time     SA treatment resource information added to pt's AVS.   Transition of Care Department (TOC) has reviewed patient and no other TOC needs have been identified at this time. We will continue to monitor patient advancement through interdisciplinary progression rounds. If new patient transition needs arise, please place a TOC consult.

## 2022-11-29 NOTE — Progress Notes (Signed)
Patient has required pain medicaiton several times this shift.  Patient has had no episodes or complaints of nausea and vomiting. Patient has voided this shift and tolerated ice chips.

## 2022-11-29 NOTE — Progress Notes (Signed)
Pt requests Dilaudid 1mg  Q 2 hrs for pain scale 8 out of 10. Pt ambulated with assistance to shower, and tolerated well.

## 2022-11-30 DIAGNOSIS — K859 Acute pancreatitis without necrosis or infection, unspecified: Secondary | ICD-10-CM | POA: Diagnosis not present

## 2022-11-30 LAB — CBC
HCT: 32.2 % — ABNORMAL LOW (ref 36.0–46.0)
Hemoglobin: 10.1 g/dL — ABNORMAL LOW (ref 12.0–15.0)
MCH: 29.2 pg (ref 26.0–34.0)
MCHC: 31.4 g/dL (ref 30.0–36.0)
MCV: 93.1 fL (ref 80.0–100.0)
Platelets: 252 10*3/uL (ref 150–400)
RBC: 3.46 MIL/uL — ABNORMAL LOW (ref 3.87–5.11)
RDW: 15.8 % — ABNORMAL HIGH (ref 11.5–15.5)
WBC: 19.8 10*3/uL — ABNORMAL HIGH (ref 4.0–10.5)
nRBC: 0 % (ref 0.0–0.2)

## 2022-11-30 LAB — COMPREHENSIVE METABOLIC PANEL
ALT: 15 U/L (ref 0–44)
AST: 17 U/L (ref 15–41)
Albumin: 2.7 g/dL — ABNORMAL LOW (ref 3.5–5.0)
Alkaline Phosphatase: 91 U/L (ref 38–126)
Anion gap: 8 (ref 5–15)
BUN: 7 mg/dL (ref 6–20)
CO2: 21 mmol/L — ABNORMAL LOW (ref 22–32)
Calcium: 7.9 mg/dL — ABNORMAL LOW (ref 8.9–10.3)
Chloride: 104 mmol/L (ref 98–111)
Creatinine, Ser: 0.6 mg/dL (ref 0.44–1.00)
GFR, Estimated: >60 mL/min (ref >60)
Glucose, Bld: 118 mg/dL — ABNORMAL HIGH (ref 70–99)
Potassium: 3.2 mmol/L — ABNORMAL LOW (ref 3.5–5.1)
Sodium: 133 mmol/L — ABNORMAL LOW (ref 135–145)
Total Bilirubin: 0.5 mg/dL (ref 0.3–1.2)
Total Protein: 6.5 g/dL (ref 6.5–8.1)

## 2022-11-30 LAB — MAGNESIUM: Magnesium: 1.6 mg/dL — ABNORMAL LOW (ref 1.7–2.4)

## 2022-11-30 LAB — LIPASE, BLOOD: Lipase: 29 U/L (ref 11–51)

## 2022-11-30 MED ORDER — MAGNESIUM SULFATE 2 GM/50ML IV SOLN
2.0000 g | Freq: Once | INTRAVENOUS | Status: AC
  Administered 2022-11-30: 2 g via INTRAVENOUS
  Filled 2022-11-30: qty 50

## 2022-11-30 MED ORDER — POTASSIUM CHLORIDE CRYS ER 20 MEQ PO TBCR
40.0000 meq | EXTENDED_RELEASE_TABLET | Freq: Two times a day (BID) | ORAL | Status: AC
  Administered 2022-11-30 (×2): 40 meq via ORAL
  Filled 2022-11-30 (×2): qty 2

## 2022-11-30 NOTE — Progress Notes (Signed)
PROGRESS NOTE    Kimberly Patrick  VHQ:[id number] DOB: [birth year] DOA: 11/28/2022 PCP: Mariel Kansky, MD   Brief Narrative:    Kimberly Patrick is a 37 y.o. female with medical history significant for hypertension and alcohol abuse who presented to the ED with complaints of back pain that was quite severe.  She had some associated nausea and vomiting and was admitted with acute pancreatitis likely alcohol induced.  Assessment & Plan:   Principal Problem:   Acute pancreatitis Active Problems:   Hypokalemia   Obesity (BMI 30-39.9)   GERD (gastroesophageal reflux disease)   Alcohol abuse  Assessment and Plan:   Acute pancreatitis likely alcohol induced -Blood alcohol level of 32 noted -Lipid panel within normal limits with no elevation of triglycerides -Continue aggressive IV fluids -Continue clear liquid diet -IV pain medications -Monitor lipase and liver enzyme trend which is downward trending -No findings of gallstones or common bile duct dilation on CT imaging, prior cholecystectomy noted  Hypokalemia/hypomagnesemia -Replete and reevaluate in a.m.   Hypertension -Continue home medications and use hydralazine for severe elevations   History of alcohol abuse -CIWA protocol   Tobacco/marijuana use -Counseled on cessation   Obesity -BMI 34.46   DVT prophylaxis:Lovenox Code Status: Full Family Communication: None at bedside Disposition Plan:  Status is: Inpatient Remains inpatient appropriate because: Need for IV medications  Consultants:  None  Procedures:  None  Antimicrobials:  None   Subjective: Patient seen and evaluated today with pain rated 7/10 and is slightly about the same as yesterday.  She has difficulty tolerating clears, but she is trying.  No further nausea or vomiting noted.  Objective: Vitals:   11/29/22 0455 11/29/22 1257 11/29/22 2015 11/30/22 0425  BP: (!) 151/86 (!) 167/108 138/75 (!) 142/79  Pulse: 72 76 76 81  Resp:  20  18 18   Temp: 97.8 F (36.6 C)  97.9 F (36.6 C) 98.4 F (36.9 C)  TempSrc: Oral  Oral Oral  SpO2: 96% 100% 96% 97%  Weight:      Height:        Intake/Output Summary (Last 24 hours) at 11/30/2022 1031 Last data filed at 11/29/2022 1600 Gross per 24 hour  Intake 3420.7 ml  Output --  Net 3420.7 ml   Filed Weights   11/28/22 0642 11/28/22 1238  Weight: 99.8 kg 99.5 kg    Examination:  General exam: Appears calm and comfortable  Respiratory system: Clear to auscultation. Respiratory effort normal. Cardiovascular system: S1 & S2 heard, RRR.  Gastrointestinal system: Abdomen is soft Central nervous system: Alert and awake Extremities: No edema Skin: No significant lesions noted Psychiatry: Flat affect.    Data Reviewed: I have personally reviewed following labs and imaging studies  CBC: Recent Labs  Lab 11/28/22 0658 11/29/22 0429 11/30/22 0434  WBC 17.9* 19.7* 19.8*  NEUTROABS 15.6*  --   --   HGB 12.0 10.8* 10.1*  HCT 36.1 33.5* 32.2*  MCV 88.9 91.5 93.1  PLT 400 296 252   Basic Metabolic Panel: Recent Labs  Lab 11/28/22 0658 11/29/22 0429 11/30/22 0434  NA 140 135 133*  K 2.8* 3.6 3.2*  CL 102 103 104  CO2 20* 24 21*  GLUCOSE 139* 128* 118*  BUN 10 9 7   CREATININE 0.79 0.68 0.60  CALCIUM 8.3* 7.8* 7.9*  MG 1.4* 1.8 1.6*   GFR: Estimated Creatinine Clearance: 116.7 mL/min (by C-G formula based on SCr of 0.6 mg/dL). Liver Function Tests: Recent Labs  Lab 11/28/22  5188 11/29/22 0429 11/30/22 0434  AST 28 22 17   ALT 22 18 15   ALKPHOS 79 78 91  BILITOT 0.5 0.6 0.5  PROT 7.4 6.8 6.5  ALBUMIN 3.2* 2.9* 2.7*   Recent Labs  Lab 11/28/22 0658 11/29/22 0429 11/30/22 0434  LIPASE 244* 94* 29   No results for input(s): "AMMONIA" in the last 168 hours. Coagulation Profile: No results for input(s): "INR", "PROTIME" in the last 168 hours. Cardiac Enzymes: No results for input(s): "CKTOTAL", "CKMB", "CKMBINDEX", "TROPONINI" in the last 168  hours. BNP (last 3 results) No results for input(s): "PROBNP" in the last 8760 hours. HbA1C: No results for input(s): "HGBA1C" in the last 72 hours. CBG: No results for input(s): "GLUCAP" in the last 168 hours. Lipid Profile: Recent Labs    11/28/22 0907  CHOL 83  HDL 53  LDLCALC 3  TRIG 133  CHOLHDL 1.6   Thyroid Function Tests: No results for input(s): "TSH", "T4TOTAL", "FREET4", "T3FREE", "THYROIDAB" in the last 72 hours. Anemia Panel: No results for input(s): "VITAMINB12", "FOLATE", "FERRITIN", "TIBC", "IRON", "RETICCTPCT" in the last 72 hours. Sepsis Labs: No results for input(s): "PROCALCITON", "LATICACIDVEN" in the last 168 hours.  No results found for this or any previous visit (from the past 240 hour(s)).       Radiology Studies: No results found.      Scheduled Meds:  amLODipine  10 mg Oral Daily   atenolol  50 mg Oral Daily   enoxaparin (LOVENOX) injection  40 mg Subcutaneous Q24H   folic acid  1 mg Oral Daily   hydrALAZINE  50 mg Oral TID   isosorbide mononitrate  30 mg Oral Daily   multivitamin with minerals  1 tablet Oral Daily   potassium chloride  40 mEq Oral BID   thiamine  100 mg Oral Daily   Or   thiamine  100 mg Intravenous Daily   Continuous Infusions:  sodium chloride 125 mL/hr at 11/30/22 0352     LOS: 2 days    Time spent: 35 minutes    Christianne Zacher Hoover Brunette, DO [facility]  If 7PM-7AM, please contact night-coverage [email or web address] 11/30/2022, 10:31 AM

## 2022-11-30 NOTE — Plan of Care (Signed)
Problem: Health Behavior/Discharge Planning: Goal: Ability to manage health-related needs will improve Outcome: Progressing   Problem: Pain Managment: Goal: General experience of comfort will improve Outcome: Progressing

## 2022-12-01 DIAGNOSIS — K859 Acute pancreatitis without necrosis or infection, unspecified: Secondary | ICD-10-CM | POA: Diagnosis not present

## 2022-12-01 LAB — BASIC METABOLIC PANEL
Anion gap: 10 (ref 5–15)
BUN: <5 mg/dL — ABNORMAL LOW (ref 6–20)
CO2: 22 mmol/L (ref 22–32)
Calcium: 8.3 mg/dL — ABNORMAL LOW (ref 8.9–10.3)
Chloride: 101 mmol/L (ref 98–111)
Creatinine, Ser: 0.67 mg/dL (ref 0.44–1.00)
GFR, Estimated: >60 mL/min (ref >60)
Glucose, Bld: 109 mg/dL — ABNORMAL HIGH (ref 70–99)
Potassium: 3.1 mmol/L — ABNORMAL LOW (ref 3.5–5.1)
Sodium: 133 mmol/L — ABNORMAL LOW (ref 135–145)

## 2022-12-01 LAB — CBC
HCT: 30 % — ABNORMAL LOW (ref 36.0–46.0)
Hemoglobin: 10.1 g/dL — ABNORMAL LOW (ref 12.0–15.0)
MCH: 30.4 pg (ref 26.0–34.0)
MCHC: 33.7 g/dL (ref 30.0–36.0)
MCV: 90.4 fL (ref 80.0–100.0)
Platelets: 267 10*3/uL (ref 150–400)
RBC: 3.32 MIL/uL — ABNORMAL LOW (ref 3.87–5.11)
RDW: 15.6 % — ABNORMAL HIGH (ref 11.5–15.5)
WBC: 14.6 10*3/uL — ABNORMAL HIGH (ref 4.0–10.5)
nRBC: 0 % (ref 0.0–0.2)

## 2022-12-01 LAB — MAGNESIUM: Magnesium: 1.6 mg/dL — ABNORMAL LOW (ref 1.7–2.4)

## 2022-12-01 MED ORDER — ONDANSETRON HCL 4 MG PO TABS: 4.0000 mg | ORAL_TABLET | Freq: Every day | ORAL | 1 refills | Status: AC | PRN

## 2022-12-01 MED ORDER — POTASSIUM CHLORIDE CRYS ER 20 MEQ PO TBCR
40.0000 meq | EXTENDED_RELEASE_TABLET | Freq: Two times a day (BID) | ORAL | Status: DC
  Administered 2022-12-01: 40 meq via ORAL
  Filled 2022-12-01: qty 2

## 2022-12-01 MED ORDER — OXYCODONE HCL 10 MG PO TABS: 10.0000 mg | ORAL_TABLET | Freq: Four times a day (QID) | ORAL | 0 refills | Status: AC | PRN

## 2022-12-01 MED ORDER — MAGNESIUM SULFATE 2 GM/50ML IV SOLN
2.0000 g | Freq: Once | INTRAVENOUS | Status: AC
  Administered 2022-12-01: 2 g via INTRAVENOUS
  Filled 2022-12-01: qty 50

## 2022-12-01 NOTE — Progress Notes (Signed)
Pt discharged via WC to main lobby to await ride home.

## 2022-12-01 NOTE — Discharge Summary (Signed)
Physician Discharge Summary  Kimberly Patrick ZDG:[id number] DOB: 13-Mar-1985 DOA: 11/28/2022  PCP: Mariel Kansky, MD  Admit date: 11/28/2022  Discharge date: 12/01/2022  Admitted From:Home  Disposition:  Home  Recommendations for Outpatient Follow-up:  Follow up with PCP in 1-2 weeks Zofran and oxycodone prescribed for nausea and pain control respectively Counseled on alcohol cessation Counseled on low-fat diet Continue other home medications as prior  Home Health: None  Equipment/Devices: None  Discharge Condition:Stable  CODE STATUS: Full  Diet recommendation: Heart Healthy/low-fat  Brief/Interim Summary:  Kimberly Patrick is a 37 y.o. female with medical history significant for hypertension and alcohol abuse who presented to the ED with complaints of back pain that was quite severe.  She had some associated nausea and vomiting and was admitted with acute pancreatitis likely alcohol induced.  She is overall doing much better today and tolerating diet and denies significant pain.  She is in stable condition for discharge and has been counseled on alcohol cessation.  No other acute events or concerns noted throughout the course of this admission.  Discharge Diagnoses:  Principal Problem:   Acute pancreatitis Active Problems:   Hypokalemia   Obesity (BMI 30-39.9)   GERD (gastroesophageal reflux disease)   Alcohol abuse  Principal discharge diagnosis: Acute alcohol induced pancreatitis.  Discharge Instructions  Discharge Instructions     Diet - low sodium heart healthy   Complete by: As directed    Increase activity slowly   Complete by: As directed       Allergies as of 12/01/2022       Reactions   Gadopentetate Hives, Other (See Comments), Swelling   Sneezing, coughing, hives, lip swelling   Ivp Dye [iodinated Contrast Media] Hives, Shortness Of Breath        Medication List     TAKE these medications    acetaminophen 325 MG tablet Commonly  known as: TYLENOL Take 2 tablets (650 mg total) by mouth every 6 (six) hours as needed for mild pain (or Fever >/= 101). What changed: how much to take   amLODipine 10 MG tablet Commonly known as: NORVASC Take 1 tablet (10 mg total) by mouth daily.   atenolol 50 MG tablet Commonly known as: Tenormin Take 1 tablet (50 mg total) by mouth daily. For BP   hydrALAZINE 50 MG tablet Commonly known as: APRESOLINE Take 1 tablet (50 mg total) by mouth 3 (three) times daily.   isosorbide mononitrate 30 MG 24 hr tablet Commonly known as: IMDUR Take 1 tablet (30 mg total) by mouth daily.   lisinopril-hydrochlorothiazide 20-12.5 MG tablet Commonly known as: Zestoretic Take 1 tablet by mouth daily.   ondansetron 4 MG tablet Commonly known as: Zofran Take 1 tablet (4 mg total) by mouth daily as needed for nausea or vomiting.   Oxycodone HCl 10 MG Tabs Take 1 tablet (10 mg total) by mouth every 6 (six) hours as needed for breakthrough pain or severe pain.        Follow-up Information     Mariel Kansky, MD. Schedule an appointment as soon as possible for a visit in 1 week(s).   Specialty: Family Medicine Contact information: [address] [place] [address] [phone]                Allergies  Allergen Reactions   Gadopentetate Hives, Other (See Comments) and Swelling    Sneezing, coughing, hives, lip swelling   Ivp Dye [Iodinated Contrast Media] Hives and Shortness Of Breath  Consultations: None   Procedures/Studies: CT ABDOMEN PELVIS WO CONTRAST  Result Date: 11/28/2022 CLINICAL DATA:  Low back pain, abdominal pain EXAM: CT ABDOMEN AND PELVIS WITHOUT CONTRAST TECHNIQUE: Multidetector CT imaging of the abdomen and pelvis was performed following the standard protocol without IV contrast. RADIATION DOSE REDUCTION: This exam was performed according to the departmental dose-optimization program which includes automated exposure control, adjustment of the  mA and/or kV according to patient size and/or use of iterative reconstruction technique. COMPARISON:  09/16/2021, 05/30/2021 FINDINGS: Lower chest: No acute findings Hepatobiliary: No focal liver abnormality is seen. Status post cholecystectomy. No biliary dilatation. Pancreas: Edema noted around the pancreas compatible with acute pancreatitis. No ductal dilatation or focal pancreatic abnormality. Spleen: No focal abnormality.  Normal size. Adrenals/Urinary Tract: No adrenal abnormality. No focal renal abnormality. No stones or hydronephrosis. Urinary bladder is unremarkable. Stomach/Bowel: Stomach, large and small bowel grossly unremarkable. Normal appendix. Vascular/Lymphatic: No evidence of aneurysm or adenopathy. Shotty retroperitoneal lymph nodes, stable since prior study and none pathologically enlarged. Reproductive: Uterus and right adnexa unremarkable. Fat containing left ovarian mass measures up to 3.7 cm compatible with dermoid. Other: No free fluid or free air. Musculoskeletal: No acute bony abnormality. IMPRESSION: Stranding noted around the pancreas compatible with acute pancreatitis. Slowly enlarging left ovarian dermoid, 3.7 cm currently compared to 3.2 cm previously. Electronically Signed   By: Charlett Nose M.D.   On: 11/28/2022 10:56   DG Chest 2 View  Result Date: 11/28/2022 CLINICAL DATA:  Chest pain Shortness of breath Right knee pain Back pain EXAM: CHEST - 2 VIEW COMPARISON:  10/13/2010 FINDINGS: Examination limited due to expiratory phase of imaging. Heart size within normal limits allowing for limitations of the study. No pulmonary vascular congestion. Lungs are clear. IMPRESSION: No acute cardiopulmonary process. Electronically Signed   By: Acquanetta Belling M.D.   On: 11/28/2022 08:12   DG Knee Complete 4 Views Right  Result Date: 11/28/2022 CLINICAL DATA:  Severe back and knee pain EXAM: RIGHT KNEE - COMPLETE 4+ VIEW COMPARISON:  None available FINDINGS: Moderate joint space loss and  spurring of the medial, lateral, and patellofemoral compartments. Minimal knee joint effusion. No fracture or dislocation. IMPRESSION: Moderate tricompartmental osteoarthrosis, advanced for patient's age. Electronically Signed   By: Acquanetta Belling M.D.   On: 11/28/2022 08:10     Discharge Exam: Vitals:   12/01/22 0456 12/01/22 0913  BP: 135/89 (!) 157/111  Pulse: 82 85  Resp: 18   Temp: 98.3 F (36.8 C)   SpO2: 99%    Vitals:   11/30/22 1300 11/30/22 1957 12/01/22 0456 12/01/22 0913  BP: (!) 160/94 139/81 135/89 (!) 157/111  Pulse: 79 76 82 85  Resp:  19 18   Temp: 97.8 F (36.6 C) 98.4 F (36.9 C) 98.3 F (36.8 C)   TempSrc: Oral Oral Oral   SpO2: 98% 100% 99%   Weight:      Height:        General: Pt is alert, awake, not in acute distress Cardiovascular: RRR, S1/S2 +, no rubs, no gallops Respiratory: CTA bilaterally, no wheezing, no rhonchi Abdominal: Soft, NT, ND, bowel sounds + Extremities: no edema, no cyanosis    The results of significant diagnostics from this hospitalization (including imaging, microbiology, ancillary and laboratory) are listed below for reference.     Microbiology: No results found for this or any previous visit (from the past 240 hour(s)).   Labs: BNP (last 3 results) Recent Labs    11/28/22 [phone]  BNP 55.0   Basic Metabolic Panel: Recent Labs  Lab 11/28/22 0658 11/29/22 0429 11/30/22 0434 12/01/22 0449  NA 140 135 133* 133*  K 2.8* 3.6 3.2* 3.1*  CL 102 103 104 101  CO2 20* 24 21* 22  GLUCOSE 139* 128* 118* 109*  BUN 10 9 7  <5*  CREATININE 0.79 0.68 0.60 0.67  CALCIUM 8.3* 7.8* 7.9* 8.3*  MG 1.4* 1.8 1.6* 1.6*   Liver Function Tests: Recent Labs  Lab 11/28/22 0658 11/29/22 0429 11/30/22 0434  AST 28 22 17   ALT 22 18 15   ALKPHOS 79 78 91  BILITOT 0.5 0.6 0.5  PROT 7.4 6.8 6.5  ALBUMIN 3.2* 2.9* 2.7*   Recent Labs  Lab 11/28/22 0658 11/29/22 0429 11/30/22 0434  LIPASE 244* 94* 29   No results for input(s):  "AMMONIA" in the last 168 hours. CBC: Recent Labs  Lab 11/28/22 0658 11/29/22 0429 11/30/22 0434 12/01/22 0449  WBC 17.9* 19.7* 19.8* 14.6*  NEUTROABS 15.6*  --   --   --   HGB 12.0 10.8* 10.1* 10.1*  HCT 36.1 33.5* 32.2* 30.0*  MCV 88.9 91.5 93.1 90.4  PLT 400 296 252 267   Cardiac Enzymes: No results for input(s): "CKTOTAL", "CKMB", "CKMBINDEX", "TROPONINI" in the last 168 hours. BNP: Invalid input(s): "POCBNP" CBG: No results for input(s): "GLUCAP" in the last 168 hours. D-Dimer No results for input(s): "DDIMER" in the last 72 hours. Hgb A1c No results for input(s): "HGBA1C" in the last 72 hours. Lipid Profile No results for input(s): "CHOL", "HDL", "LDLCALC", "TRIG", "CHOLHDL", "LDLDIRECT" in the last 72 hours. Thyroid function studies No results for input(s): "TSH", "T4TOTAL", "T3FREE", "THYROIDAB" in the last 72 hours.  Invalid input(s): "FREET3" Anemia work up No results for input(s): "VITAMINB12", "FOLATE", "FERRITIN", "TIBC", "IRON", "RETICCTPCT" in the last 72 hours. Urinalysis    Component Value Date/Time   COLORURINE YELLOW 09/18/2021 1630   APPEARANCEUR CLEAR 09/18/2021 1630   LABSPEC 1.009 09/18/2021 1630   PHURINE 7.0 09/18/2021 1630   GLUCOSEU NEGATIVE 09/18/2021 1630   HGBUR MODERATE (A) 09/18/2021 1630   BILIRUBINUR NEGATIVE 09/18/2021 1630   KETONESUR 5 (A) 09/18/2021 1630   PROTEINUR NEGATIVE 09/18/2021 1630   UROBILINOGEN 1.0 07/28/2012 0027   NITRITE NEGATIVE 09/18/2021 1630   LEUKOCYTESUR NEGATIVE 09/18/2021 1630   Sepsis Labs Recent Labs  Lab 11/28/22 0658 11/29/22 0429 11/30/22 0434 12/01/22 0449  WBC 17.9* 19.7* 19.8* 14.6*   Microbiology No results found for this or any previous visit (from the past 240 hour(s)).   Time coordinating discharge: 35 minutes  SIGNED:   Erick Blinks, DO [facility] 12/01/2022, 10:02 AM  If 7PM-7AM, please contact night-coverage [email or web address]

## 2023-01-03 DIAGNOSIS — G35 Multiple sclerosis: Secondary | ICD-10-CM | POA: Insufficient documentation

## 2023-01-05 ENCOUNTER — Encounter: Payer: Self-pay | Admitting: Orthopedic Surgery

## 2023-01-05 ENCOUNTER — Ambulatory Visit: Payer: Medicaid Other | Admitting: Orthopedic Surgery

## 2023-01-05 VITALS — BP 102/68 | HR 71 | Ht 63.0 in | Wt 209.0 lb

## 2023-01-05 DIAGNOSIS — M1711 Unilateral primary osteoarthritis, right knee: Secondary | ICD-10-CM

## 2023-01-05 MED ORDER — PREDNISONE 10 MG (48) PO TBPK
ORAL_TABLET | Freq: Every day | ORAL | 0 refills | Status: AC
Start: 2023-01-05 — End: ?

## 2023-01-05 MED ORDER — METHYLPREDNISOLONE ACETATE 40 MG/ML IJ SUSP
40.0000 mg | Freq: Once | INTRAMUSCULAR | Status: AC
Start: 2023-01-05 — End: 2023-01-05
  Administered 2023-01-05: 40 mg via INTRA_ARTICULAR

## 2023-01-05 NOTE — Progress Notes (Signed)
Office Visit Note   Patient: Kimberly Patrick           Date of Birth: 08-Mar-1985           MRN: [id number] Visit Date: 01/05/2023 Requested by: Sharlene Dory, NP [address] [place],  [place] [address] PCP: Mariel Kansky, MD  [facility]    Assessment & Plan:   Encounter Diagnosis  Name Primary?   Primary osteoarthritis of right knee Yes    Meds ordered this encounter  Medications   predniSONE (STERAPRED UNI-PAK 48 TAB) 10 MG (48) TBPK tablet    Sig: Take by mouth daily.    Dispense:  48 tablet    Refill:  0   methylPREDNISolone acetate (DEPO-MEDROL) injection 40 mg    37 YO FEMALE SEVERE OA FOR AGE  PROBABLY NEEDS A TKA BUT HER AGE IS NOT APPROPRIATE  START W ASP INJ THEN TRY STEROIDS   STANDING FILMS NEXT TIME    Subjective: Chief Complaint  Patient presents with   Knee Pain    Pain in right knee x2 years just began to become worse had x rays at [facility] last month     HPI: 37 YO F WITH YEARS OF KNEE PAIN, WORSE OVER THE LAST YEAR NO PRIOR TREATMENT EXCEPT OPIOIDS   She complains of pain and swelling of her right knee and the swelling causes her to have difficulty with weightbearing               ROS: Review of Systems  Constitutional:  Negative for fever.  Respiratory:  Negative for shortness of breath.   Cardiovascular:  Negative for chest pain.  Skin: Negative.   Neurological:  Negative for tingling and sensory change.      Images personally read and my interpretation : None standing x-rays show severe arthritis of all 3 compartment with valgus malalignment of the knee  Visit Diagnoses:  1. Primary osteoarthritis of right knee      Follow-Up Instructions: Return in about 5 weeks (around 02/09/2023) for FOLLOW UP, RIGHT, KNEE.    Objective: Vital Signs: BP 102/68   Pulse 71   Ht 5\' 3"  (1.6 m)   Wt 209 lb (94.8 kg)   BMI 37.02 kg/m   Physical Exam Vitals and nursing note reviewed.  Constitutional:      Appearance: Normal  appearance.  HENT:     Head: Normocephalic and atraumatic.  Eyes:     General: No scleral icterus.       Right eye: No discharge.        Left eye: No discharge.     Extraocular Movements: Extraocular movements intact.     Conjunctiva/sclera: Conjunctivae normal.     Pupils: Pupils are equal, round, and reactive to light.  Cardiovascular:     Rate and Rhythm: Normal rate.     Pulses: Normal pulses.  Musculoskeletal:     Right knee: Effusion present.  Skin:    General: Skin is warm and dry.     Capillary Refill: Capillary refill takes less than 2 seconds.  Neurological:     General: No focal deficit present.     Mental Status: She is alert and oriented to person, place, and time.  Psychiatric:        Mood and Affect: Mood normal.        Behavior: Behavior normal.        Thought Content: Thought content normal.        Judgment:  Judgment normal.    Right Knee Exam   Tenderness  The patient is experiencing tenderness in the lateral joint line.  Range of Motion  Extension:  -15  Flexion:  100   Other  Erythema: absent Scars: absent Sensation: normal Pulse: present Swelling: none Effusion: effusion present     Specialty Comments:  No specialty comments available.  Imaging: No results found.   PMFS History: Patient Active Problem List   Diagnosis Date Noted   MS (multiple sclerosis) (HCC) 01/03/2023   Alcohol use disorder 09/16/2021   Hypoalbuminemia 09/16/2021   Miscarriage 09/16/2021   Leukocytosis 09/16/2021   Calculus of gallbladder without cholecystitis without obstruction    Pancreatic pseudocyst    Alcohol abuse    Acute pancreatitis 05/30/2021   GERD (gastroesophageal reflux disease) 05/30/2021   Serum phosphorus decreased 08/12/2020   HTN (hypertension) 08/12/2020   Obesity (BMI 30-39.9) 08/12/2020   Hypokalemia 08/10/2020   Depression    Hypomagnesemia    Tobacco use disorder    Optic neuritis 06/11/2014   Allergic reaction to contrast dye  06/10/2014   Pyuria 06/10/2014   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   Supervision of other normal pregnancy 06/20/2012   Gallstones and inflammation of gallbladder without obstruction    Past Medical History:  Diagnosis Date   Depression    Gallstones and inflammation of gallbladder without obstruction 03/28/2012   Heart palpitations    Hypertension     Family History  Problem Relation Age of Onset   Coronary artery disease Mother    Hypertension Mother    Breast cancer Cousin    Diabetes Maternal Aunt    Stroke Maternal Aunt     Past Surgical History:  Procedure Laterality Date   CHOLECYSTECTOMY N/A 06/29/2021   Procedure: LAPAROSCOPIC CHOLECYSTECTOMY;  Surgeon: Lewie Chamber, DO;  Location: [facility];  Service: General;  Laterality: N/A;   Social History   Occupational History   Not on file  Tobacco Use   Smoking status: Every Day    Current packs/day: 0.50    Types: Cigarettes   Smokeless tobacco: Never  Vaping Use   Vaping status: Never Used  Substance and Sexual Activity   Alcohol use: Yes    Comment: occ. use. no alcohol since preg.   Drug use: Yes    Types: Marijuana    Comment: occasionally   Sexual activity: Not Currently    Birth control/protection: None   Aspiration injection right knee  Procedure note injection and aspiration right knee joint  Verbal consent was obtained to aspirate and inject the right knee joint   Timeout was completed to confirm the site of aspiration and injection  An 18-gauge needle was used to aspirate the knee joint from a suprapatellar lateral approach.  The medications used were 40 mg of Depo-Medrol and 1% lidocaine 3 cc  Anesthesia was provided by ethyl chloride and the skin was prepped with alcohol.  After cleaning the skin with alcohol an 18-gauge needle was used to aspirate the right knee joint.  We obtained 35  cc of fluid YEL  We follow this by injection of 40 mg of Depo-Medrol and 3 cc 1%  lidocaine.  There were no complications. A sterile bandage was applied.

## 2023-01-05 NOTE — Patient Instructions (Signed)
Oow note for 01-05-2023

## 2023-02-09 ENCOUNTER — Ambulatory Visit (INDEPENDENT_AMBULATORY_CARE_PROVIDER_SITE_OTHER): Payer: Medicaid Other | Admitting: Orthopedic Surgery

## 2023-02-09 ENCOUNTER — Encounter: Payer: Self-pay | Admitting: Orthopedic Surgery

## 2023-02-09 ENCOUNTER — Other Ambulatory Visit (INDEPENDENT_AMBULATORY_CARE_PROVIDER_SITE_OTHER): Payer: Medicaid Other

## 2023-02-09 VITALS — Ht 66.0 in | Wt 211.0 lb

## 2023-02-09 DIAGNOSIS — M1711 Unilateral primary osteoarthritis, right knee: Secondary | ICD-10-CM

## 2023-02-09 DIAGNOSIS — M25561 Pain in right knee: Secondary | ICD-10-CM

## 2023-02-09 MED ORDER — SULINDAC 150 MG PO TABS
150.0000 mg | ORAL_TABLET | Freq: Two times a day (BID) | ORAL | 5 refills | Status: AC
Start: 2023-02-09 — End: ?

## 2023-02-09 NOTE — Progress Notes (Signed)
  Office Visit Note   Patient: Kimberly Patrick           Date of Birth: [birth year]           MRN: [id number] Visit Date: 02/09/2023 Requested by: Mariel Kansky, MD [address] [place],  [place] [address] PCP: Mariel Kansky, MD   Chief Complaint  Patient presents with   Knee Pain    Patient having right knee pain with swelling to the foot  hard to stand and walk at work     Allergies  Allergen Reactions   Gadopentetate Hives, Other (See Comments) and Swelling    Sneezing, coughing, hives, lip swelling   Ivp Dye [Iodinated Contrast Media] Hives and Shortness Of Breath   Encounter Diagnoses  Name Primary?   Right knee pain, unspecified chronicity    Primary osteoarthritis of right knee Yes   Body mass index is 34.06 kg/m.  Today: 37 year old female with persistent pain and swelling right knee did not respond adequately to aspiration injection of cortisone and steroids  Returns with persistent pain and swelling difficulty walking and working   HPI: 37 YO F WITH YEARS OF KNEE PAIN, WORSE OVER THE LAST YEAR NO PRIOR TREATMENT EXCEPT OPIOIDS    She complains of pain and swelling of her right knee and the swelling causes her to have difficulty with weightbearing    IMAGING: DG Knee Complete 4 Views Right  Result Date: 02/09/2023 Standing views and Zoila Shutter view right knee Chronic right knee pain osteoarthritis X-rays show valgus osteoarthritis with lateral compartment severe disease.  Slight increase in valgus tibiofemoral angle Both medial and lateral Hemi joint show osteophyte formation as does the patellofemoral joint on axial view Compared to prior x-rays taken in the ER which were not standing films these films definitively confirm the severe arthritis in the right knee for this 37 year old female    MANAGEMENT   I recommend this patient be seen at [facility] she can start some Clinoril to help with her pain and swelling.  There is no treatment for  her at this point with Medicaid as her primary ensure she is not a candidate for hyaluronic acid injection  She has had the aspiration injection with cortisone and has been on oxycodone for pain  Recommend referral to tertiary care for management  Meds ordered this encounter  Medications   sulindac (CLINORIL) 150 MG tablet    Sig: Take 1 tablet (150 mg total) by mouth 2 (two) times daily.    Dispense:  60 tablet    Refill:  5       Fuller Canada, MD  02/09/2023 10:50 AM

## 2023-02-09 NOTE — Patient Instructions (Signed)
OOW note until December 20th

## 2023-02-12 ENCOUNTER — Telehealth: Payer: Self-pay | Admitting: Orthopedic Surgery

## 2023-02-12 ENCOUNTER — Telehealth: Payer: Self-pay | Admitting: Radiology

## 2023-02-12 NOTE — Telephone Encounter (Signed)
Referred to [facility]  Can you please make disk of xrays for her to take with her? Thank you

## 2023-02-12 NOTE — Telephone Encounter (Signed)
I have sent referral to [facility] orthopedics she can call them to schedule the number is [phone]. I called to advise her to make appointment but she needs CD message sent to [facility] to get CD ready for her, made phone encounter so hopefully if she calls back we can advise her to call and schedule.

## 2023-09-05 ENCOUNTER — Ambulatory Visit (HOSPITAL_COMMUNITY): Attending: Adult Reconstructive Orthopaedic Surgery

## 2023-09-05 ENCOUNTER — Other Ambulatory Visit: Payer: Self-pay

## 2023-09-05 ENCOUNTER — Encounter (HOSPITAL_COMMUNITY): Payer: Self-pay

## 2023-09-05 DIAGNOSIS — M1711 Unilateral primary osteoarthritis, right knee: Secondary | ICD-10-CM | POA: Diagnosis present

## 2023-09-05 DIAGNOSIS — Z96651 Presence of right artificial knee joint: Secondary | ICD-10-CM | POA: Diagnosis not present

## 2023-09-05 DIAGNOSIS — Z7409 Other reduced mobility: Secondary | ICD-10-CM | POA: Insufficient documentation

## 2023-09-05 NOTE — Therapy (Signed)
 OUTPATIENT PHYSICAL THERAPY LOWER EXTREMITY EVALUATION   Patient Name: Kimberly Patrick MRN: [id number] DOB:10/28/1985, 38 y.o., female Today's Date: 09/05/2023  END OF SESSION:  PT End of Session - 09/05/23 1352     Visit Number 1    Date for PT Re-Evaluation 10/17/23    Authorization Type [employer] Medicaid    Authorization Time Period seeking auth    Progress Note Due on Visit 10    PT Start Time 1350    PT Stop Time 1426    PT Time Calculation (min) 36 min    Activity Tolerance Patient tolerated treatment well    Behavior During Therapy [facility] for tasks assessed/performed          Past Medical History:  Diagnosis Date   Depression    Gallstones and inflammation of gallbladder without obstruction 03/28/2012   Heart palpitations    Hypertension    Past Surgical History:  Procedure Laterality Date   CHOLECYSTECTOMY N/A 06/29/2021   Procedure: LAPAROSCOPIC CHOLECYSTECTOMY;  Surgeon: Evonnie Dorothyann LABOR, DO;  Location: [facility];  Service: General;  Laterality: N/A;   Patient Active Problem List   Diagnosis Date Noted   MS (multiple sclerosis) (HCC) 01/03/2023   Alcohol use disorder 09/16/2021   Hypoalbuminemia 09/16/2021   Miscarriage 09/16/2021   Leukocytosis 09/16/2021   Calculus of gallbladder without cholecystitis without obstruction    Pancreatic pseudocyst    Alcohol abuse    Acute pancreatitis 05/30/2021   GERD (gastroesophageal reflux disease) 05/30/2021   Serum phosphorus decreased 08/12/2020   HTN (hypertension) 08/12/2020   Obesity (BMI 30-39.9) 08/12/2020   Hypokalemia 08/10/2020   Depression    Hypomagnesemia    Tobacco use disorder    Optic neuritis 06/11/2014   Allergic reaction to contrast dye 06/10/2014   Pyuria 06/10/2014   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   Supervision of other normal pregnancy 06/20/2012   Gallstones and inflammation of gallbladder without obstruction     PCP: Georgina Gilmore BRAVO, MD  REFERRING PROVIDER: Delene Rush  MD  REFERRING DIAG: right knee osteoarthritis   THERAPY DIAG:  Osteoarthritis of right knee, unspecified osteoarthritis type  Status post right knee replacement  Impaired functional mobility, balance, gait, and endurance  Rationale for Evaluation and Treatment: Rehabilitation  ONSET DATE: 08/27/2023  SUBJECTIVE:   SUBJECTIVE STATEMENT: Pt presenting with R TKA, her pain varies, but is 8/10 currently. Pt walking everyday - to her mailbox- with HEP from hospital.  PERTINENT HISTORY:  PAIN:  Are you having pain? Yes: NPRS scale: 8/10 Pain location: Right knee Pain description: achy/nagging Aggravating factors: movement Relieving factors: ice, rest  PRECAUTIONS: None  RED FLAGS: None   WEIGHT BEARING RESTRICTIONS: No  FALLS:  Has patient fallen in last 6 months? No  PATIENT GOALS:  get back to normal as possible   OBJECTIVE:  Note: Objective measures were completed at Evaluation unless otherwise noted.  DIAGNOSTIC FINDINGS:   PATIENT SURVEYS:  LEFS:22 / 80 = 27.5 %   COGNITION: Overall cognitive status: Within functional limits for tasks assessed     SENSATION: Not tested   POSTURE: weight shift left  PALPATION:   LOWER EXTREMITY ROM:  Active ROM Right eval Left eval  Hip flexion    Hip extension    Hip abduction    Hip adduction    Hip internal rotation    Hip external rotation    Knee flexion 88 135  Knee extension -10 0  Ankle dorsiflexion    Ankle plantarflexion  Ankle inversion    Ankle eversion     (Blank rows = not tested)  LOWER EXTREMITY MMT:  MMT Right eval Left eval  Hip flexion    Hip extension    Hip abduction    Hip adduction    Hip internal rotation    Hip external rotation    Knee flexion    Knee extension 3- 4-  Ankle dorsiflexion    Ankle plantarflexion    Ankle inversion    Ankle eversion     (Blank rows = not tested)   FUNCTIONAL TESTS:  30 Second Chair Stand Test: 10x  Norms:   Age 61-64  94-69 35-74 83-79 51-84 34-89 70-94  Women 15 15 14 13 12 11 9   Men 17 16 15 14 13 11 9    2MWT:159ft  GAIT: Distance walked: 47ft Assistive device utilized: Environmental consultant - 2 wheeled Level of assistance: Modified independence Comments: step through pattern with antalgia, limited step length on R side with mild external rotation using RW.                                                                                                                                TREATMENT DATE:   09/05/2023 Evaluation Supine heel slide x 10 Supine quad set 10x10'' Seated heel slide x 10 with 10'' LAQ x 10 w/ 3''   PATIENT EDUCATION:  Education details: PT Evaluation, findings, prognosis, frequency, attendance policy, and HEP, walking from 1-2x. Person educated: Patient Education method: Medical illustrator Education comprehension: verbalized understanding and returned demonstration  HOME EXERCISE PROGRAM: Access Code: [email or web address] URL: [email or web address] Date: 09/05/2023 Prepared by: Omega Bottcher  Exercises - Supine Heel Slide  - 1 x daily - 7 x weekly - 3 sets - 10 reps - 10 hold - Supine Quad Set  - 1 x daily - 7 x weekly - 3 sets - 10 reps - 10 hold - Seated Heel Slide  - 1 x daily - 7 x weekly - 3 sets - 10 reps - 10 hold - Sit to Stand Without Arm Support  - 1 x daily - 7 x weekly - 3 sets - 10 reps - Seated Long Arc Quad  - 1 x daily - 7 x weekly - 3 sets - 10 reps - 3 hold  ASSESSMENT:  CLINICAL IMPRESSION: Patient is a 38 y.o. female who was seen today for physical therapy evaluation and treatment for 'right knee osteoarthritis.'  Pt with R knee ROM limitations, muscle weakness in hips and distal BLE, and pain causing reduced functional mobility, ambulation capacity, reduced activity tolerance and safety with increased falls risk. Pt will benefit from skilled Physical Therapy services to address deficits/limitations in order to improve functional and QOL.    OBJECTIVE IMPAIRMENTS: Abnormal gait, decreased activity tolerance, decreased balance, decreased mobility, difficulty walking, decreased ROM, decreased strength, hypomobility, postural dysfunction, and pain.   ACTIVITY LIMITATIONS: carrying, lifting, bending, sitting, standing, squatting,  stairs, transfers, and locomotion level  PARTICIPATION LIMITATIONS: driving, community activity, occupation, and yard work  PERSONAL FACTORS: Age are also affecting patient's functional outcome.   REHAB POTENTIAL: Excellent  CLINICAL DECISION MAKING: Stable/uncomplicated  EVALUATION COMPLEXITY: Low   GOALS: Goals reviewed with patient? No  SHORT TERM GOALS: Target date: 09/26/23  Pt will be independent with HEP in order to demonstrate participation in Physical Therapy POC.  Baseline: Goal status: INITIAL  2.  Pt will 6/10 pain during mobility in order to demonstrate improved pain while performing ADLs.  Baseline:  Goal status: INITIAL  LONG TERM GOALS: Target date: /13/25  Pt will improve 30 Second Chair Stand Test by at least 2 in order to demonstrate improved functional strength to return to desired activities.  Baseline: see objective.  Goal status: INITIAL  2.  Pt will improve 2 MWT by 132ft in order to demonstrate improved functional ambulatory capacity in community setting.  Baseline: see objective.  Goal status: INITIAL  3.  Pt will improve LEFS score by at  least 40% in order to demonstrate improved pain with functional goals and outcomes. Baseline: see objective.  Goal status: INITIAL  4.  Pt will improve R Knee ROM to 0-120 in order to improve symmetry during functional activities.. Baseline: see objective.  Goal status: INITIAL  5.  Pt will 4/10 pain during mobility in order to demonstrate improved pain while performing ADLs.  Baseline:  Goal status: INITIAL     PLAN:  PT FREQUENCY: 2x/week  PT DURATION: 6 weeks  PLANNED INTERVENTIONS: 97164- PT Re-evaluation,  97110-Therapeutic exercises, 97530- Therapeutic activity, 97112- Neuromuscular re-education, 97535- Self Care, 02859- Manual therapy, [phone]- Gait training, Patient/Family education, Balance training, Stair training, Joint mobilization, Joint manipulation, Cryotherapy, and Moist heat  PLAN FOR NEXT SESSION: R TKA, progress ROM and muscle strength as tolerance, balance, gluteal strengthening  Omega JONETTA Donna ALMETA, DPT [facility]- [place] [phone] office   Managed Medicaid Authorization Request  Visit Dx Codes: M17.11  Z96.651   Functional Tool Score: 27.5%  For all possible CPT codes, reference the Planned Interventions line above.     Check all conditions that are expected to impact treatment: {Conditions expected to impact treatment:Unknown   If treatment provided at initial evaluation, no treatment charged due to lack of authorization.    Omega JONETTA Donna, PT 09/05/2023, 2:27 PM

## 2023-09-10 ENCOUNTER — Ambulatory Visit (HOSPITAL_COMMUNITY)

## 2023-09-10 ENCOUNTER — Encounter (HOSPITAL_COMMUNITY): Payer: Self-pay

## 2023-09-14 ENCOUNTER — Ambulatory Visit (HOSPITAL_COMMUNITY): Attending: Adult Reconstructive Orthopaedic Surgery

## 2023-09-14 DIAGNOSIS — M1711 Unilateral primary osteoarthritis, right knee: Secondary | ICD-10-CM | POA: Insufficient documentation

## 2023-09-14 DIAGNOSIS — Z7409 Other reduced mobility: Secondary | ICD-10-CM | POA: Insufficient documentation

## 2023-09-14 DIAGNOSIS — Z96651 Presence of right artificial knee joint: Secondary | ICD-10-CM | POA: Insufficient documentation

## 2023-09-14 NOTE — Therapy (Signed)
 OUTPATIENT PHYSICAL THERAPY LOWER EXTREMITY TREATMENT   Patient Name: Kimberly Patrick MRN: [id number] DOB:November 24, 1985, 38 y.o., female Today's Date: 09/14/2023  END OF SESSION:  PT End of Session - 09/14/23 1025     Visit Number 2    Date for PT Re-Evaluation 10/17/23    Authorization Type [employer] Medicaid    Authorization Time Period no auth required; visit limit 30    Authorization - Visit Number 1    Progress Note Due on Visit 10    PT Start Time 1015    PT Stop Time 1100    PT Time Calculation (min) 45 min    Activity Tolerance Patient tolerated treatment well    Behavior During Therapy [facility] for tasks assessed/performed           Past Medical History:  Diagnosis Date   Depression    Gallstones and inflammation of gallbladder without obstruction 03/28/2012   Heart palpitations    Hypertension    Past Surgical History:  Procedure Laterality Date   CHOLECYSTECTOMY N/A 06/29/2021   Procedure: LAPAROSCOPIC CHOLECYSTECTOMY;  Surgeon: Evonnie Dorothyann LABOR, DO;  Location: [facility];  Service: General;  Laterality: N/A;   Patient Active Problem List   Diagnosis Date Noted   MS (multiple sclerosis) (HCC) 01/03/2023   Alcohol use disorder 09/16/2021   Hypoalbuminemia 09/16/2021   Miscarriage 09/16/2021   Leukocytosis 09/16/2021   Calculus of gallbladder without cholecystitis without obstruction    Pancreatic pseudocyst    Alcohol abuse    Acute pancreatitis 05/30/2021   GERD (gastroesophageal reflux disease) 05/30/2021   Serum phosphorus decreased 08/12/2020   HTN (hypertension) 08/12/2020   Obesity (BMI 30-39.9) 08/12/2020   Hypokalemia 08/10/2020   Depression    Hypomagnesemia    Tobacco use disorder    Optic neuritis 06/11/2014   Allergic reaction to contrast dye 06/10/2014   Pyuria 06/10/2014   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   Supervision of other normal pregnancy 06/20/2012   Gallstones and inflammation of gallbladder without obstruction     PCP: Georgina Gilmore BRAVO, MD  REFERRING PROVIDER: Delene Rush MD  REFERRING DIAG: right knee osteoarthritis   THERAPY DIAG:  Osteoarthritis of right knee, unspecified osteoarthritis type  Status post right knee replacement  Impaired functional mobility, balance, gait, and endurance  Rationale for Evaluation and Treatment: Rehabilitation  ONSET DATE: 08/27/2023  SUBJECTIVE:   SUBJECTIVE STATEMENT: *Goes by Kimberly Patrick* Daily: Pt reports she went to her f-up MD appointment yesterday and they said she's doing well.   (Initial) Pt presenting with R TKA, her pain varies, but is 8/10 currently. Pt walking everyday - to her mailbox- with HEP from hospital.  PERTINENT HISTORY:  PAIN:  Are you having pain? Yes: NPRS scale: 8/10 Pain location: Right knee Pain description: achy/nagging Aggravating factors: movement Relieving factors: ice, rest  PRECAUTIONS: None  RED FLAGS: None   WEIGHT BEARING RESTRICTIONS: No  FALLS:  Has patient fallen in last 6 months? No  PATIENT GOALS:  get back to normal as possible   OBJECTIVE:  Note: Objective measures were completed at Evaluation unless otherwise noted.  DIAGNOSTIC FINDINGS:   PATIENT SURVEYS:  LEFS:22 / 80 = 27.5 %  COGNITION: Overall cognitive status: Within functional limits for tasks assessed     SENSATION: Not tested  POSTURE: weight shift left  LOWER EXTREMITY ROM:  Active ROM Right eval Left eval  Hip flexion    Hip extension    Hip abduction    Hip adduction  Hip internal rotation    Hip external rotation    Knee flexion 88 135  Knee extension -10 0  Ankle dorsiflexion    Ankle plantarflexion    Ankle inversion    Ankle eversion     (Blank rows = not tested)  LOWER EXTREMITY MMT:  MMT Right eval Left eval  Hip flexion    Hip extension    Hip abduction    Hip adduction    Hip internal rotation    Hip external rotation    Knee flexion    Knee extension 3- 4-  Ankle dorsiflexion    Ankle  plantarflexion    Ankle inversion    Ankle eversion     (Blank rows = not tested)   FUNCTIONAL TESTS:  30 Second Chair Stand Test: 10x  Norms:   Age 67-64 82-69 30-74 1-79 42-84 54-89 34-94  Women 15 15 14 13 12 11 9   Men 17 16 15 14 13 11 9    2MWT:114ft  GAIT: Distance walked: 56ft Assistive device utilized: Environmental consultant - 2 wheeled Level of assistance: Modified independence Comments: step through pattern with antalgia, limited step length on R side with mild external rotation using RW.                                                                                                                              TREATMENT DATE:  09/14/23 - Nu Step seat 10; 5 minutes  - Gait training in // bars over hurdle - cues for kick your bottom, knee forward, kick out, toe up, heel -> toe, then step through -  reps w/ bilateral // bar support  - Seated HS stretch - 6 x 10s holds - Heel/toe raises 30 reps w/ B UE assist on // bars - TKE w/ RTB  20 reps with cues for full knee extension - Ball roll up knee flexion - 30 reps - Glut bridges 20 reps with max knee flexion and symmetrical WB through heels   09/05/2023 Evaluation Supine heel slide x 10 Supine quad set 10x10'' Seated heel slide x 10 with 10'' LAQ x 10 w/ 3''   PATIENT EDUCATION:  Education details: PT Evaluation, findings, prognosis, frequency, attendance policy, and HEP, walking from 1-2x. Person educated: Patient Education method: Medical illustrator Education comprehension: verbalized understanding and returned demonstration  HOME EXERCISE PROGRAM: Access Code: [email or web address] URL: [email or web address] Date: 09/05/2023 Prepared by: Omega Bottcher  Exercises - Supine Heel Slide  - 1 x daily - 7 x weekly - 3 sets - 10 reps - 10 hold - Supine Quad Set  - 1 x daily - 7 x weekly - 3 sets - 10 reps - 10 hold - Seated Heel Slide  - 1 x daily - 7 x weekly - 3 sets - 10 reps - 10 hold - Sit to Stand Without Arm  Support  - 1 x daily - 7 x weekly - 3 sets - 10  reps - Seated Long Arc Quad  - 1 x daily - 7 x weekly - 3 sets - 10 reps - 3 hold  ASSESSMENT: Daily Assessment: Pt demonstrates great tolerance to PT skilled interventions with progression in quad strength, ROM and functional gait training. Educated on continuing to perform functional gait training and increasing ROM through interventions and with addition of bridging needed for improving glut activation. Recommend continued skilled PT services for progressing with strength and ROM as needed for return to PLOF and improve independence with QOL.   CLINICAL IMPRESSION: Patient is a 38 y.o. female who was seen today for physical therapy evaluation and treatment for 'right knee osteoarthritis.'  Pt with R knee ROM limitations, muscle weakness in hips and distal BLE, and pain causing reduced functional mobility, ambulation capacity, reduced activity tolerance and safety with increased falls risk. Pt will benefit from skilled Physical Therapy services to address deficits/limitations in order to improve functional and QOL.   OBJECTIVE IMPAIRMENTS: Abnormal gait, decreased activity tolerance, decreased balance, decreased mobility, difficulty walking, decreased ROM, decreased strength, hypomobility, postural dysfunction, and pain.   ACTIVITY LIMITATIONS: carrying, lifting, bending, sitting, standing, squatting, stairs, transfers, and locomotion level  PARTICIPATION LIMITATIONS: driving, community activity, occupation, and yard work  PERSONAL FACTORS: Age are also affecting patient's functional outcome.   REHAB POTENTIAL: Excellent  CLINICAL DECISION MAKING: Stable/uncomplicated  EVALUATION COMPLEXITY: Low   GOALS: Goals reviewed with patient? No  SHORT TERM GOALS: Target date: 09/26/23  Pt will be independent with HEP in order to demonstrate participation in Physical Therapy POC.  Baseline: Goal status: INITIAL  2.  Pt will 6/10 pain during  mobility in order to demonstrate improved pain while performing ADLs.  Baseline:  Goal status: INITIAL  LONG TERM GOALS: Target date: /13/25  Pt will improve 30 Second Chair Stand Test by at least 2 in order to demonstrate improved functional strength to return to desired activities.  Baseline: see objective.  Goal status: INITIAL  2.  Pt will improve 2 MWT by 121ft in order to demonstrate improved functional ambulatory capacity in community setting.  Baseline: see objective.  Goal status: INITIAL  3.  Pt will improve LEFS score by at  least 40% in order to demonstrate improved pain with functional goals and outcomes. Baseline: see objective.  Goal status: INITIAL  4.  Pt will improve R Knee ROM to 0-120 in order to improve symmetry during functional activities.. Baseline: see objective.  Goal status: INITIAL  5.  Pt will 4/10 pain during mobility in order to demonstrate improved pain while performing ADLs.  Baseline:  Goal status: INITIAL     PLAN:  PT FREQUENCY: 2x/week  PT DURATION: 6 weeks  PLANNED INTERVENTIONS: 97164- PT Re-evaluation, 97110-Therapeutic exercises, 97530- Therapeutic activity, 97112- Neuromuscular re-education, 97535- Self Care, 02859- Manual therapy, [phone]- Gait training, Patient/Family education, Balance training, Stair training, Joint mobilization, Joint manipulation, Cryotherapy, and Moist heat  PLAN FOR NEXT SESSION: R TKA, progress ROM and muscle strength as tolerance, balance, gluteal strengthening  Lamarr LITTIE Citrin PT, DPT [facility]- [place] [phone] office  Lamarr LITTIE Citrin, PT 09/14/2023, 11:18 AM

## 2023-09-18 ENCOUNTER — Ambulatory Visit (HOSPITAL_COMMUNITY)

## 2023-09-18 ENCOUNTER — Encounter (HOSPITAL_COMMUNITY): Payer: Self-pay

## 2023-09-18 DIAGNOSIS — M1711 Unilateral primary osteoarthritis, right knee: Secondary | ICD-10-CM | POA: Diagnosis not present

## 2023-09-18 DIAGNOSIS — Z7409 Other reduced mobility: Secondary | ICD-10-CM

## 2023-09-18 DIAGNOSIS — Z96651 Presence of right artificial knee joint: Secondary | ICD-10-CM

## 2023-09-18 NOTE — Therapy (Signed)
 OUTPATIENT PHYSICAL THERAPY LOWER EXTREMITY TREATMENT   Patient Name: Kimberly Patrick MRN: [id number] DOB:19-Apr-1985, 38 y.o., female Today's Date: 09/18/2023  END OF SESSION:  PT End of Session - 09/18/23 1351     Visit Number 3    Date for PT Re-Evaluation 10/17/23    Authorization Type [employer]    Authorization Time Period no auth required; visit limit 30    Authorization - Visit Number 2    Progress Note Due on Visit 10    PT Start Time 1351    PT Stop Time 1430    PT Time Calculation (min) 39 min    Activity Tolerance Patient tolerated treatment well    Behavior During Therapy [facility] for tasks assessed/performed            Past Medical History:  Diagnosis Date   Depression    Gallstones and inflammation of gallbladder without obstruction 03/28/2012   Heart palpitations    Hypertension    Past Surgical History:  Procedure Laterality Date   CHOLECYSTECTOMY N/A 06/29/2021   Procedure: LAPAROSCOPIC CHOLECYSTECTOMY;  Surgeon: Evonnie Dorothyann LABOR, DO;  Location: [facility];  Service: General;  Laterality: N/A;   Patient Active Problem List   Diagnosis Date Noted   MS (multiple sclerosis) (HCC) 01/03/2023   Alcohol use disorder 09/16/2021   Hypoalbuminemia 09/16/2021   Miscarriage 09/16/2021   Leukocytosis 09/16/2021   Calculus of gallbladder without cholecystitis without obstruction    Pancreatic pseudocyst    Alcohol abuse    Acute pancreatitis 05/30/2021   GERD (gastroesophageal reflux disease) 05/30/2021   Serum phosphorus decreased 08/12/2020   HTN (hypertension) 08/12/2020   Obesity (BMI 30-39.9) 08/12/2020   Hypokalemia 08/10/2020   Depression    Hypomagnesemia    Tobacco use disorder    Optic neuritis 06/11/2014   Allergic reaction to contrast dye 06/10/2014   Pyuria 06/10/2014   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   Supervision of other normal pregnancy 06/20/2012   Gallstones and inflammation of gallbladder without obstruction     PCP:  Georgina Gilmore BRAVO, MD  REFERRING PROVIDER: Delene Rush MD  REFERRING DIAG: right knee osteoarthritis   THERAPY DIAG:  Osteoarthritis of right knee, unspecified osteoarthritis type  Status post right knee replacement  Impaired functional mobility, balance, gait, and endurance  Rationale for Evaluation and Treatment: Rehabilitation  ONSET DATE: 08/27/2023  SUBJECTIVE:   SUBJECTIVE STATEMENT: Pt reports able to walk about a 1/4 of a mile to store and then back. Pt states her knee is feeling better and walking is getting better. Pt states pain is about a 5/10 upon presentation. Pt states she has been walking everyday and HEP every other day.  (Initial) Pt presenting with R TKA, her pain varies, but is 8/10 currently. Pt walking everyday - to her mailbox- with HEP from hospital.  PERTINENT HISTORY:  PAIN:  Are you having pain? Yes: NPRS scale: 8/10 Pain location: Right knee Pain description: achy/nagging Aggravating factors: movement Relieving factors: ice, rest  PRECAUTIONS: None  RED FLAGS: None   WEIGHT BEARING RESTRICTIONS: No  FALLS:  Has patient fallen in last 6 months? No  PATIENT GOALS:  get back to normal as possible   OBJECTIVE:  Note: Objective measures were completed at Evaluation unless otherwise noted.  DIAGNOSTIC FINDINGS:   PATIENT SURVEYS:  LEFS:22 / 80 = 27.5 %  COGNITION: Overall cognitive status: Within functional limits for tasks assessed     SENSATION: Not tested  POSTURE: weight shift left  LOWER EXTREMITY ROM:  Active ROM Right eval Left eval  Hip flexion    Hip extension    Hip abduction    Hip adduction    Hip internal rotation    Hip external rotation    Knee flexion 88 135  Knee extension -10 0  Ankle dorsiflexion    Ankle plantarflexion    Ankle inversion    Ankle eversion     (Blank rows = not tested)  LOWER EXTREMITY MMT:  MMT Right eval Left eval  Hip flexion    Hip extension    Hip abduction     Hip adduction    Hip internal rotation    Hip external rotation    Knee flexion    Knee extension 3- 4-  Ankle dorsiflexion    Ankle plantarflexion    Ankle inversion    Ankle eversion     (Blank rows = not tested)   FUNCTIONAL TESTS:  30 Second Chair Stand Test: 10x  Norms:   Age 9-64 33-69 28-74 75-79 85-84 85-89 90-94  Women 15 15 14 13 12 11 9   Men 17 16 15 14 13 11 9    2MWT:173ft  GAIT: Distance walked: 26ft Assistive device utilized: Environmental consultant - 2 wheeled Level of assistance: Modified independence Comments: step through pattern with antalgia, limited step length on R side with mild external rotation using RW.                                                                                                                              TREATMENT DATE:  09/18/2023  Therapeutic Exercise: -Stationary bike, rocking and then full rotations, 5 minutes -Heel slides with green strap for OP, 10 reps, 10 second holds (98 degrees of flexion obtained) -Aeromat walks, tandem and lateral stepping, 2 laps each variation, in parallel bars, BUE required -Step ups, 4 inch step, 1 sets of 10 reps,  pt cued for decreased UE support -Lateral step up and overs, 4 inch step, 1 sets of 8 reps bilaterally -Total knee extensions, 2 sets of 10 reps, in parallel bars RTB at posterior knee    09/14/23 - Nu Step seat 10; 5 minutes  - Gait training in // bars over hurdle - cues for kick your bottom, knee forward, kick out, toe up, heel -> toe, then step through -  reps w/ bilateral // bar support  - Seated HS stretch - 6 x 10s holds - Heel/toe raises 30 reps w/ B UE assist on // bars - TKE w/ RTB  20 reps with cues for full knee extension - Ball roll up knee flexion - 30 reps - Glut bridges 20 reps with max knee flexion and symmetrical WB through heels   09/05/2023 Evaluation Supine heel slide x 10 Supine quad set 10x10'' Seated heel slide x 10 with 10'' LAQ x 10 w/ 3''   PATIENT EDUCATION:   Education details: PT Evaluation, findings, prognosis, frequency, attendance  policy, and HEP, walking from 1-2x. Person educated: Patient Education method: Medical illustrator Education comprehension: verbalized understanding and returned demonstration  HOME EXERCISE PROGRAM: Access Code: [email or web address] URL: [email or web address] Date: 09/05/2023 Prepared by: Omega Bottcher  Exercises - Supine Heel Slide  - 1 x daily - 7 x weekly - 3 sets - 10 reps - 10 hold - Supine Quad Set  - 1 x daily - 7 x weekly - 3 sets - 10 reps - 10 hold - Seated Heel Slide  - 1 x daily - 7 x weekly - 3 sets - 10 reps - 10 hold - Sit to Stand Without Arm Support  - 1 x daily - 7 x weekly - 3 sets - 10 reps - Seated Long Arc Quad  - 1 x daily - 7 x weekly - 3 sets - 10 reps - 3 hold  ASSESSMENT:  CLINICAL IMPRESSION: Patient continues to demonstrate increased right knee pain, decreased RLE strength, decreased gait quality and balance. Patient does demonstrate increased ROM with ability to progress to full rotations on stationary bike with no increase in pain. Patient able to progress dynamic balance and core activation exercises today with aeromat walks and step up variations, good performance with verbal cueing. Patient would continue to benefit from skilled physical therapy for increased endurance with ambulation, increased RLE strength, and improved balance for improved quality of life, improved independence with community ambulation and continued progress towards therapy goals.   EVAL: Patient is a 38 y.o. female who was seen today for physical therapy evaluation and treatment for 'right knee osteoarthritis.'  Pt with R knee ROM limitations, muscle weakness in hips and distal BLE, and pain causing reduced functional mobility, ambulation capacity, reduced activity tolerance and safety with increased falls risk. Pt will benefit from skilled Physical Therapy services to address  deficits/limitations in order to improve functional and QOL.   OBJECTIVE IMPAIRMENTS: Abnormal gait, decreased activity tolerance, decreased balance, decreased mobility, difficulty walking, decreased ROM, decreased strength, hypomobility, postural dysfunction, and pain.   ACTIVITY LIMITATIONS: carrying, lifting, bending, sitting, standing, squatting, stairs, transfers, and locomotion level  PARTICIPATION LIMITATIONS: driving, community activity, occupation, and yard work  PERSONAL FACTORS: Age are also affecting patient's functional outcome.   REHAB POTENTIAL: Excellent  CLINICAL DECISION MAKING: Stable/uncomplicated  EVALUATION COMPLEXITY: Low   GOALS: Goals reviewed with patient? No  SHORT TERM GOALS: Target date: 09/26/23  Pt will be independent with HEP in order to demonstrate participation in Physical Therapy POC.  Baseline: Goal status: INITIAL  2.  Pt will 6/10 pain during mobility in order to demonstrate improved pain while performing ADLs.  Baseline:  Goal status: INITIAL  LONG TERM GOALS: Target date: 10/17/23  Pt will improve 30 Second Chair Stand Test by at least 2 in order to demonstrate improved functional strength to return to desired activities.  Baseline: see objective.  Goal status: INITIAL  2.  Pt will improve 2 MWT by 11ft in order to demonstrate improved functional ambulatory capacity in community setting.  Baseline: see objective.  Goal status: INITIAL  3.  Pt will improve LEFS score by at  least 40% in order to demonstrate improved pain with functional goals and outcomes. Baseline: see objective.  Goal status: INITIAL  4.  Pt will improve R Knee ROM to 0-120 in order to improve symmetry during functional activities.. Baseline: see objective.  Goal status: INITIAL  5.  Pt will 4/10 pain during mobility in order to demonstrate improved pain while performing ADLs.  Baseline:  Goal status: INITIAL     PLAN:  PT FREQUENCY: 2x/week  PT  DURATION: 6 weeks  PLANNED INTERVENTIONS: 97164- PT Re-evaluation, 97110-Therapeutic exercises, 97530- Therapeutic activity, 97112- Neuromuscular re-education, 97535- Self Care, 02859- Manual therapy, [phone]- Gait training, Patient/Family education, Balance training, Stair training, Joint mobilization, Joint manipulation, Cryotherapy, and Moist heat  PLAN FOR NEXT SESSION: R TKA, progress ROM and muscle strength as tolerance, balance, gluteal strengthening  Lang Ada, PT, DPT [facility] Office: [phone] 2:39 PM, 09/18/23

## 2023-09-20 ENCOUNTER — Encounter (HOSPITAL_COMMUNITY)

## 2023-09-25 ENCOUNTER — Ambulatory Visit (HOSPITAL_COMMUNITY)

## 2023-10-02 ENCOUNTER — Ambulatory Visit (HOSPITAL_COMMUNITY): Admitting: Physical Therapy

## 2023-10-02 DIAGNOSIS — Z96651 Presence of right artificial knee joint: Secondary | ICD-10-CM

## 2023-10-02 DIAGNOSIS — M1711 Unilateral primary osteoarthritis, right knee: Secondary | ICD-10-CM | POA: Diagnosis not present

## 2023-10-02 DIAGNOSIS — Z7409 Other reduced mobility: Secondary | ICD-10-CM

## 2023-10-02 NOTE — Therapy (Signed)
 OUTPATIENT PHYSICAL THERAPY LOWER EXTREMITY TREATMENT   Patient Name: Kimberly Patrick MRN: [id number] DOB:11/30/1985, 38 y.o., female Today's Date: 10/02/2023  END OF SESSION:  PT End of Session - 10/02/23 0724     Visit Number 4    Number of Visits 12    Date for PT Re-Evaluation 10/17/23    Authorization Type [employer] Medicaid    Authorization Time Period no auth required; visit limit 30    Authorization - Visit Number 4    Authorization - Number of Visits 30    Progress Note Due on Visit 10    PT Start Time 0723    PT Stop Time 0809    PT Time Calculation (min) 46 min    Activity Tolerance Patient tolerated treatment well    Behavior During Therapy [facility] for tasks assessed/performed            Past Medical History:  Diagnosis Date   Depression    Gallstones and inflammation of gallbladder without obstruction 03/28/2012   Heart palpitations    Hypertension    Past Surgical History:  Procedure Laterality Date   CHOLECYSTECTOMY N/A 06/29/2021   Procedure: LAPAROSCOPIC CHOLECYSTECTOMY;  Surgeon: Evonnie Dorothyann LABOR, DO;  Location: [facility];  Service: General;  Laterality: N/A;   Patient Active Problem List   Diagnosis Date Noted   MS (multiple sclerosis) (HCC) 01/03/2023   Alcohol use disorder 09/16/2021   Hypoalbuminemia 09/16/2021   Miscarriage 09/16/2021   Leukocytosis 09/16/2021   Calculus of gallbladder without cholecystitis without obstruction    Pancreatic pseudocyst    Alcohol abuse    Acute pancreatitis 05/30/2021   GERD (gastroesophageal reflux disease) 05/30/2021   Serum phosphorus decreased 08/12/2020   HTN (hypertension) 08/12/2020   Obesity (BMI 30-39.9) 08/12/2020   Hypokalemia 08/10/2020   Depression    Hypomagnesemia    Tobacco use disorder    Optic neuritis 06/11/2014   Allergic reaction to contrast dye 06/10/2014   Pyuria 06/10/2014   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   Supervision of other normal pregnancy 06/20/2012   Gallstones and  inflammation of gallbladder without obstruction     PCP: Georgina Gilmore BRAVO, MD  REFERRING PROVIDER: Delene Rush MD  REFERRING DIAG: right knee osteoarthritis   THERAPY DIAG:  Osteoarthritis of right knee, unspecified osteoarthritis type  Status post right knee replacement  Impaired functional mobility, balance, gait, and endurance  Rationale for Evaluation and Treatment: Rehabilitation  ONSET DATE: 08/27/2023  SUBJECTIVE:   SUBJECTIVE STATEMENT: Pt reports she was working full time at [employer] before her sx and hopes to go back.  Comes today without pain just stiffness.   (Initial) Pt presenting with R TKA, her pain varies, but is 8/10 currently. Pt walking everyday - to her mailbox- with HEP from hospital.  PERTINENT HISTORY:  PAIN:  Are you having pain? No  PRECAUTIONS: None  RED FLAGS: None   WEIGHT BEARING RESTRICTIONS: No  FALLS:  Has patient fallen in last 6 months? No  PATIENT GOALS:  get back to normal as possible   OBJECTIVE:  Note: Objective measures were completed at Evaluation unless otherwise noted.  DIAGNOSTIC FINDINGS:   PATIENT SURVEYS:  LEFS:22 / 80 = 27.5 %  COGNITION: Overall cognitive status: Within functional limits for tasks assessed     SENSATION: Not tested  POSTURE: weight shift left  LOWER EXTREMITY ROM:  Active ROM Right eval Left eval Right 10/02/23  Hip flexion     Hip extension  Hip abduction     Hip adduction     Hip internal rotation     Hip external rotation     Knee flexion 88 135 120  Knee extension -10 0 -7  Ankle dorsiflexion     Ankle plantarflexion     Ankle inversion     Ankle eversion      (Blank rows = not tested)  LOWER EXTREMITY MMT:  MMT Right eval Left eval  Hip flexion    Hip extension    Hip abduction    Hip adduction    Hip internal rotation    Hip external rotation    Knee flexion    Knee extension 3- 4-  Ankle dorsiflexion    Ankle plantarflexion    Ankle inversion     Ankle eversion     (Blank rows = not tested)   FUNCTIONAL TESTS:  30 Second Chair Stand Test: 10x  Norms:   Age 32-64 40-69 69-74 69-79 75-84 69-89 1-94  Women 15 15 14 13 12 11 9   Men 17 16 15 14 13 11 9    2MWT:112ft  GAIT: Distance walked: 11ft Assistive device utilized: Environmental consultant - 2 wheeled Level of assistance: Modified independence Comments: step through pattern with antalgia, limited step length on R side with mild external rotation using RW.                                                                                                                              TREATMENT DATE:  10/02/23 Bike seat 12 full revolutions 5 minutes Standing:  Rt knee flexion on 12 step 10X10  Slant board stretch 3X30  Hamstring stretch onto 12 step 3X30  Forward step up and overs 10x Rt lead, 4 step  Rt lateral step downs 10X, 4 step Prone knee hang instruction for HEP Supine:  quad sets 10X5  SAQ 10X5 (-15 degrees max extension)  Heelslides 10X AROM -7 to 120 Scar massage instruction   09/18/2023  Therapeutic Exercise: -Stationary bike, rocking and then full rotations, 5 minutes -Heel slides with green strap for OP, 10 reps, 10 second holds (98 degrees of flexion obtained) -Aeromat walks, tandem and lateral stepping, 2 laps each variation, in parallel bars, BUE required -Step ups, 4 inch step, 1 sets of 10 reps,  pt cued for decreased UE support -Lateral step up and overs, 4 inch step, 1 sets of 8 reps bilaterally -Total knee extensions, 2 sets of 10 reps, in parallel bars RTB at posterior knee    09/14/23 - Nu Step seat 10; 5 minutes  - Gait training in // bars over hurdle - cues for kick your bottom, knee forward, kick out, toe up, heel -> toe, then step through -  reps w/ bilateral // bar support  - Seated HS stretch - 6 x 10s holds - Heel/toe raises 30 reps w/ B UE assist on // bars - TKE w/ RTB  20 reps with  cues for full knee extension - Ball roll up knee  flexion - 30 reps - Glut bridges 20 reps with max knee flexion and symmetrical WB through heels   09/05/2023 Evaluation Supine heel slide x 10 Supine quad set 10x10'' Seated heel slide x 10 with 10'' LAQ x 10 w/ 3''   PATIENT EDUCATION:  Education details: PT Evaluation, findings, prognosis, frequency, attendance policy, and HEP, walking from 1-2x. Person educated: Patient Education method: Medical illustrator Education comprehension: verbalized understanding and returned demonstration  HOME EXERCISE PROGRAM: Access Code: [email or web address] URL: [email or web address] Date: 09/05/2023 Prepared by: Omega Bottcher  Exercises - Supine Heel Slide  - 1 x daily - 7 x weekly - 3 sets - 10 reps - 10 hold - Supine Quad Set  - 1 x daily - 7 x weekly - 3 sets - 10 reps - 10 hold - Seated Heel Slide  - 1 x daily - 7 x weekly - 3 sets - 10 reps - 10 hold - Sit to Stand Without Arm Support  - 1 x daily - 7 x weekly - 3 sets - 10 reps - Seated Long Arc Quad  - 1 x daily - 7 x weekly - 3 sets - 10 reps - 3 hold  ASSESSMENT:  CLINICAL IMPRESSION: Patient comes today wearing slides; recommended to wear tennis shoes for future appts as unable to to do some of the normal activities today due to safety.  Began with bike with ability to make full revolutions without difficulty.  Noted lack of extension so began slant board and hamstring stretches.  Continued with step up activities, adding step downs to work on eccentric strengthening.   Worked on gait, completing heel to toe as continues to walk with Rt LE stiff and slightly in extension lag. Short arc quad added with 15 degree bend at max contraction.   AROM in supine today measured at -7 to 120.  Instructed with prone knee hang for home to work on extension as well as scar massage as extreme tightness distally. Therapist able to achieve some scar release at proximal end today.  Patient will continue to benefit from skilled physical therapy  for increased endurance with ambulation, increased RLE strength, and improved balance for improved quality of life, improved independence with community ambulation and continued progress towards therapy goals.   EVAL: Patient is a 38 y.o. female who was seen today for physical therapy evaluation and treatment for 'right knee osteoarthritis.'  Pt with R knee ROM limitations, muscle weakness in hips and distal BLE, and pain causing reduced functional mobility, ambulation capacity, reduced activity tolerance and safety with increased falls risk. Pt will benefit from skilled Physical Therapy services to address deficits/limitations in order to improve functional and QOL.   OBJECTIVE IMPAIRMENTS: Abnormal gait, decreased activity tolerance, decreased balance, decreased mobility, difficulty walking, decreased ROM, decreased strength, hypomobility, postural dysfunction, and pain.   ACTIVITY LIMITATIONS: carrying, lifting, bending, sitting, standing, squatting, stairs, transfers, and locomotion level  PARTICIPATION LIMITATIONS: driving, community activity, occupation, and yard work  PERSONAL FACTORS: Age are also affecting patient's functional outcome.   REHAB POTENTIAL: Excellent  CLINICAL DECISION MAKING: Stable/uncomplicated  EVALUATION COMPLEXITY: Low   GOALS: Goals reviewed with patient? No  SHORT TERM GOALS: Target date: 09/26/23  Pt will be independent with HEP in order to demonstrate participation in Physical Therapy POC.  Baseline: Goal status: INITIAL  2.  Pt will 6/10 pain during mobility in order to demonstrate improved pain while performing ADLs.  Baseline:  Goal status: INITIAL  LONG TERM GOALS: Target date: 10/17/23  Pt will improve 30 Second Chair Stand Test by at least 2 in order to demonstrate improved functional strength to return to desired activities.  Baseline: see objective.  Goal status: INITIAL  2.  Pt will improve 2 MWT by 141ft in order to demonstrate improved  functional ambulatory capacity in community setting.  Baseline: see objective.  Goal status: INITIAL  3.  Pt will improve LEFS score by at  least 40% in order to demonstrate improved pain with functional goals and outcomes. Baseline: see objective.  Goal status: INITIAL  4.  Pt will improve R Knee ROM to 0-120 in order to improve symmetry during functional activities.. Baseline: see objective.  Goal status: INITIAL  5.  Pt will 4/10 pain during mobility in order to demonstrate improved pain while performing ADLs.  Baseline:  Goal status: INITIAL     PLAN:  PT FREQUENCY: 2x/week  PT DURATION: 6 weeks  PLANNED INTERVENTIONS: 97164- PT Re-evaluation, 97110-Therapeutic exercises, 97530- Therapeutic activity, 97112- Neuromuscular re-education, 97535- Self Care, 02859- Manual therapy, [phone]- Gait training, Patient/Family education, Balance training, Stair training, Joint mobilization, Joint manipulation, Cryotherapy, and Moist heat  PLAN FOR NEXT SESSION: Progress ROM, extension lacking more than flexion.  Improve quad strength and gait quality.   Greig KATHEE Fuse, PTA/CLT [facility] Ph: [phone] 8:18 AM, 10/02/23

## 2023-10-04 ENCOUNTER — Encounter (HOSPITAL_COMMUNITY)

## 2023-10-08 NOTE — Therapy (Incomplete)
 OUTPATIENT PHYSICAL THERAPY LOWER EXTREMITY TREATMENT   Patient Name: Kimberly Patrick MRN: [id number] DOB:09-Jun-1985, 38 y.o., female Today's Date: 10/08/2023  END OF SESSION:      Past Medical History:  Diagnosis Date   Depression    Gallstones and inflammation of gallbladder without obstruction 03/28/2012   Heart palpitations    Hypertension    Past Surgical History:  Procedure Laterality Date   CHOLECYSTECTOMY N/A 06/29/2021   Procedure: LAPAROSCOPIC CHOLECYSTECTOMY;  Surgeon: Evonnie Dorothyann LABOR, DO;  Location: [facility];  Service: General;  Laterality: N/A;   Patient Active Problem List   Diagnosis Date Noted   MS (multiple sclerosis) (HCC) 01/03/2023   Alcohol use disorder 09/16/2021   Hypoalbuminemia 09/16/2021   Miscarriage 09/16/2021   Leukocytosis 09/16/2021   Calculus of gallbladder without cholecystitis without obstruction    Pancreatic pseudocyst    Alcohol abuse    Acute pancreatitis 05/30/2021   GERD (gastroesophageal reflux disease) 05/30/2021   Serum phosphorus decreased 08/12/2020   HTN (hypertension) 08/12/2020   Obesity (BMI 30-39.9) 08/12/2020   Hypokalemia 08/10/2020   Depression    Hypomagnesemia    Tobacco use disorder    Optic neuritis 06/11/2014   Allergic reaction to contrast dye 06/10/2014   Pyuria 06/10/2014   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   Supervision of other normal pregnancy 06/20/2012   Gallstones and inflammation of gallbladder without obstruction     PCP: Georgina Gilmore BRAVO, MD  REFERRING PROVIDER: Delene Rush MD  REFERRING DIAG: right knee osteoarthritis   THERAPY DIAG:  No diagnosis found.  Rationale for Evaluation and Treatment: Rehabilitation  ONSET DATE: 08/27/2023  SUBJECTIVE:   SUBJECTIVE STATEMENT: Pt reports ***  (Initial) Pt presenting with R TKA, her pain varies, but is 8/10 currently. Pt walking everyday - to her mailbox- with HEP from hospital.  PERTINENT HISTORY:  PAIN:  Are you having  pain? No  PRECAUTIONS: None  RED FLAGS: None   WEIGHT BEARING RESTRICTIONS: No  FALLS:  Has patient fallen in last 6 months? No  PATIENT GOALS:  get back to normal as possible   OBJECTIVE:  Note: Objective measures were completed at Evaluation unless otherwise noted.  DIAGNOSTIC FINDINGS:   PATIENT SURVEYS:  LEFS:22 / 80 = 27.5 %  COGNITION: Overall cognitive status: Within functional limits for tasks assessed     SENSATION: Not tested  POSTURE: weight shift left  LOWER EXTREMITY ROM:  Active ROM Right eval Left eval Right 10/02/23  Hip flexion     Hip extension     Hip abduction     Hip adduction     Hip internal rotation     Hip external rotation     Knee flexion 88 135 120  Knee extension -10 0 -7  Ankle dorsiflexion     Ankle plantarflexion     Ankle inversion     Ankle eversion      (Blank rows = not tested)  LOWER EXTREMITY MMT:  MMT Right eval Left eval  Hip flexion    Hip extension    Hip abduction    Hip adduction    Hip internal rotation    Hip external rotation    Knee flexion    Knee extension 3- 4-  Ankle dorsiflexion    Ankle plantarflexion    Ankle inversion    Ankle eversion     (Blank rows = not tested)   FUNCTIONAL TESTS:  30 Second Chair Stand Test: 10x  Norms:   Age  60-64 65-69 70-74 75-79 80-84 85-89 90-94  Women 15 15 14 13 12 11 9   Men 17 16 15 14 13 11 9    2MWT:144ft  GAIT: Distance walked: 45ft Assistive device utilized: Environmental consultant - 2 wheeled Level of assistance: Modified independence Comments: step through pattern with antalgia, limited step length on R side with mild external rotation using RW.                                                                                                                              TREATMENT DATE:  10/09/23 ***  10/02/23 Bike seat 12 full revolutions 5 minutes Standing:  Rt knee flexion on 12 step 10X10  Slant board stretch 3X30  Hamstring stretch onto 12 step  3X30  Forward step up and overs 10x Rt lead, 4 step  Rt lateral step downs 10X, 4 step Prone knee hang instruction for HEP Supine:  quad sets 10X5  SAQ 10X5 (-15 degrees max extension)  Heelslides 10X AROM -7 to 120 Scar massage instruction   09/18/2023  Therapeutic Exercise: -Stationary bike, rocking and then full rotations, 5 minutes -Heel slides with green strap for OP, 10 reps, 10 second holds (98 degrees of flexion obtained) -Aeromat walks, tandem and lateral stepping, 2 laps each variation, in parallel bars, BUE required -Step ups, 4 inch step, 1 sets of 10 reps,  pt cued for decreased UE support -Lateral step up and overs, 4 inch step, 1 sets of 8 reps bilaterally -Total knee extensions, 2 sets of 10 reps, in parallel bars RTB at posterior knee  PATIENT EDUCATION:  Education details: PT Evaluation, findings, prognosis, frequency, attendance policy, and HEP, walking from 1-2x. Person educated: Patient Education method: Medical illustrator Education comprehension: verbalized understanding and returned demonstration  HOME EXERCISE PROGRAM: Access Code: [email or web address] URL: [email or web address] Date: 09/05/2023 Prepared by: Omega Bottcher  Exercises - Supine Heel Slide  - 1 x daily - 7 x weekly - 3 sets - 10 reps - 10 hold - Supine Quad Set  - 1 x daily - 7 x weekly - 3 sets - 10 reps - 10 hold - Seated Heel Slide  - 1 x daily - 7 x weekly - 3 sets - 10 reps - 10 hold - Sit to Stand Without Arm Support  - 1 x daily - 7 x weekly - 3 sets - 10 reps - Seated Long Arc Quad  - 1 x daily - 7 x weekly - 3 sets - 10 reps - 3 hold  ASSESSMENT:  CLINICAL IMPRESSION: Patient *** Patient will continue to benefit from skilled physical therapy for increased endurance with ambulation, increased RLE strength, and improved balance for improved quality of life, improved independence with community ambulation and continued progress towards therapy goals.   EVAL:  Patient is a 38 y.o. female who was seen today for physical therapy evaluation and treatment for 'right knee osteoarthritis.'  Pt with R knee ROM limitations, muscle  weakness in hips and distal BLE, and pain causing reduced functional mobility, ambulation capacity, reduced activity tolerance and safety with increased falls risk. Pt will benefit from skilled Physical Therapy services to address deficits/limitations in order to improve functional and QOL.   OBJECTIVE IMPAIRMENTS: Abnormal gait, decreased activity tolerance, decreased balance, decreased mobility, difficulty walking, decreased ROM, decreased strength, hypomobility, postural dysfunction, and pain.   ACTIVITY LIMITATIONS: carrying, lifting, bending, sitting, standing, squatting, stairs, transfers, and locomotion level  PARTICIPATION LIMITATIONS: driving, community activity, occupation, and yard work  PERSONAL FACTORS: Age are also affecting patient's functional outcome.   REHAB POTENTIAL: Excellent  CLINICAL DECISION MAKING: Stable/uncomplicated  EVALUATION COMPLEXITY: Low   GOALS: Goals reviewed with patient? No  SHORT TERM GOALS: Target date: 09/26/23  Pt will be independent with HEP in order to demonstrate participation in Physical Therapy POC.  Baseline: Goal status: INITIAL  2.  Pt will 6/10 pain during mobility in order to demonstrate improved pain while performing ADLs.  Baseline:  Goal status: INITIAL  LONG TERM GOALS: Target date: 10/17/23  Pt will improve 30 Second Chair Stand Test by at least 2 in order to demonstrate improved functional strength to return to desired activities.  Baseline: see objective.  Goal status: INITIAL  2.  Pt will improve 2 MWT by 184ft in order to demonstrate improved functional ambulatory capacity in community setting.  Baseline: see objective.  Goal status: INITIAL  3.  Pt will improve LEFS score by at  least 40% in order to demonstrate improved pain with functional goals and  outcomes. Baseline: see objective.  Goal status: INITIAL  4.  Pt will improve R Knee ROM to 0-120 in order to improve symmetry during functional activities.. Baseline: see objective.  Goal status: INITIAL  5.  Pt will 4/10 pain during mobility in order to demonstrate improved pain while performing ADLs.  Baseline:  Goal status: INITIAL     PLAN:  PT FREQUENCY: 2x/week  PT DURATION: 6 weeks  PLANNED INTERVENTIONS: 97164- PT Re-evaluation, 97110-Therapeutic exercises, 97530- Therapeutic activity, 97112- Neuromuscular re-education, 97535- Self Care, 02859- Manual therapy, [phone]- Gait training, Patient/Family education, Balance training, Stair training, Joint mobilization, Joint manipulation, Cryotherapy, and Moist heat  PLAN FOR NEXT SESSION: Progress ROM, extension lacking more than flexion.  Improve quad strength and gait quality.   Lamarr LITTIE Citrin PT, DPT [facility]- [facility] [phone] office  10:49 AM, 10/08/23

## 2023-10-09 ENCOUNTER — Encounter (HOSPITAL_COMMUNITY)

## 2023-10-09 ENCOUNTER — Encounter (HOSPITAL_COMMUNITY): Payer: Self-pay

## 2023-10-09 NOTE — Therapy (Signed)
 [facility] Rehabilitation at [facility] [address] [place], [place], [address] Phone: [phone]   Fax:  [phone]  Patient Details  Name: Kimberly Patrick MRN: [id number] Date of Birth: Sep 24, 1985 Referring Provider:  No ref. provider found  Encounter Date: 10/09/2023  Called pt regarding no show. Pt reported that she got a message regarding not having insurance anymore and financially will not be able to afford the PT visits therefore is trying to figure out her insurance before returning back to PT.   Lamarr LITTIE Citrin, PT 10/09/2023, 11:50 AM  [facility] Outpatient Rehabilitation at [facility] [address] [place], [place], [address] Phone: [phone]   Fax:  [phone]

## 2023-10-09 NOTE — Therapy (Signed)
 [facility] Outpatient Rehabilitation at [facility] [address] [place], [place], [address] Phone: [phone]   Fax:  [phone]  Patient Details  Name: Kimberly Patrick MRN: [id number] Date of Birth: [birth year] Referring Provider:  No ref. provider found  Encounter Date: 10/09/2023  Send MD fax message regarding cancellation of future appointments secondary to financial limitations.   Lamarr LITTIE Citrin, PT 10/09/2023, 12:15 PM  [facility] Rehabilitation at [facility] [address] [place], [place], [address] Phone: [phone]   Fax:  [phone]

## 2023-10-11 ENCOUNTER — Encounter (HOSPITAL_COMMUNITY): Admitting: Physical Therapy

## 2023-10-16 ENCOUNTER — Encounter (HOSPITAL_COMMUNITY)

## 2023-10-18 ENCOUNTER — Encounter (HOSPITAL_COMMUNITY)

## 2023-10-31 IMAGING — CT CT ABD-PELV W/O CM
2 of 4 series · 16 of 46 positions shown, 18 images · non-contrast
Comparison: CT from 11/08/2011, ultrasound from 08/10/2020

CLINICAL DATA: Acute abdominal pain for several days, initial
encounter



[Series 2: axial st · axial · 0.81mm/px · z∈[+1597,+2027]mm · 13 of 96 slices shown, 15 images]
[im 5/96  soft-tissue]
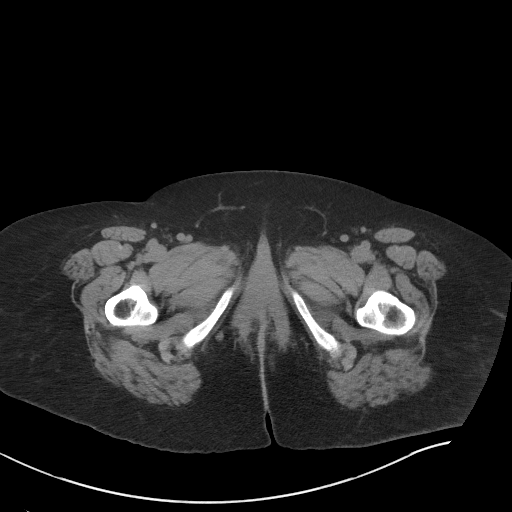
[im 5/96  bone]
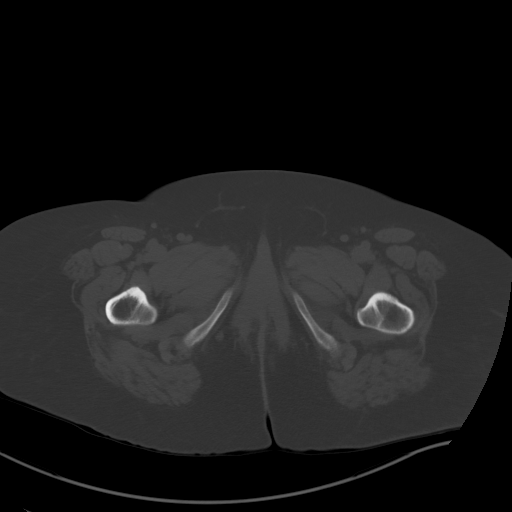
[im 15/96  soft-tissue]
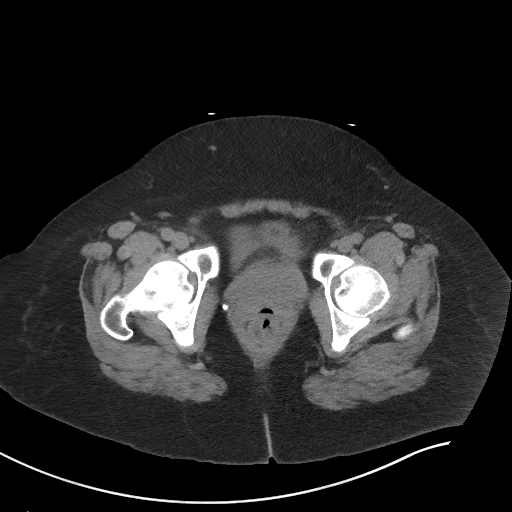
[im 20/96  soft-tissue]
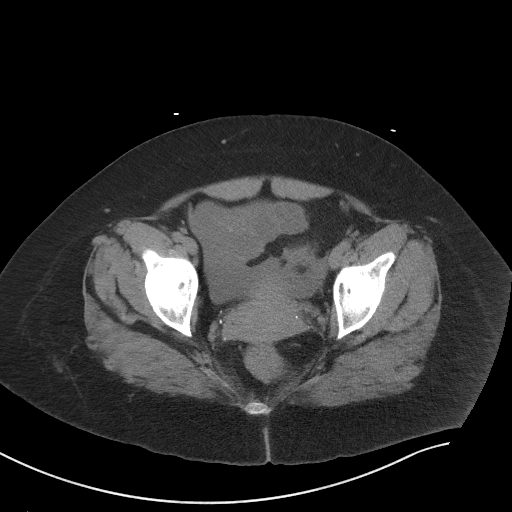
[im 29/96  soft-tissue]
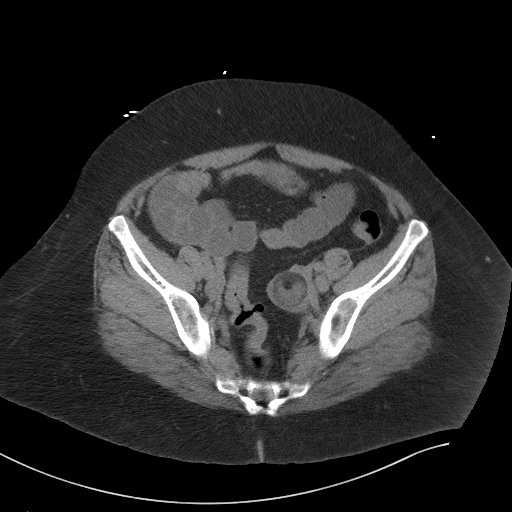
[im 34/96  soft-tissue]
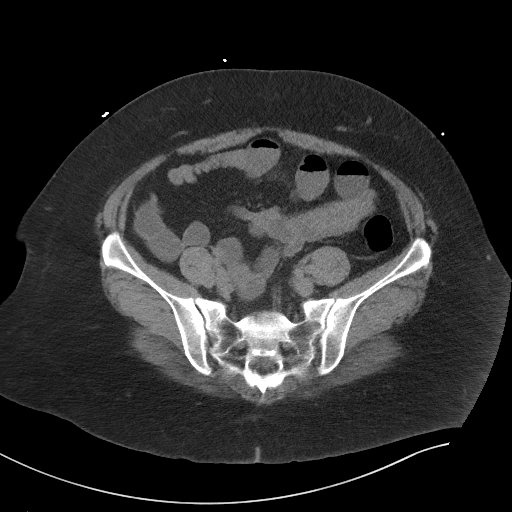
[im 43/96  soft-tissue]
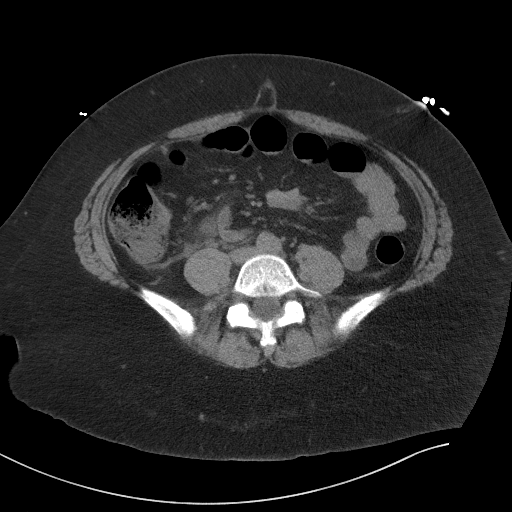
[im 48/96  soft-tissue]
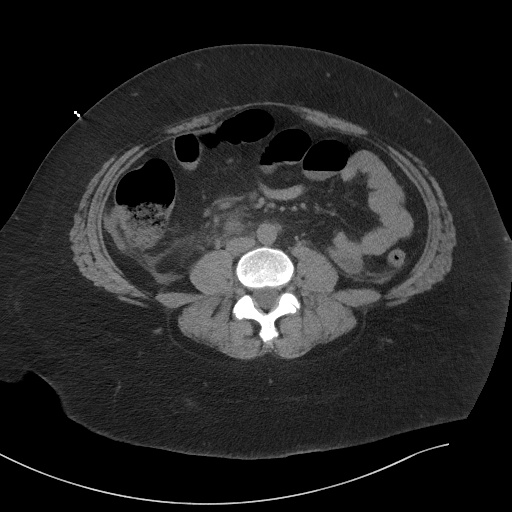
[im 53/96  soft-tissue]
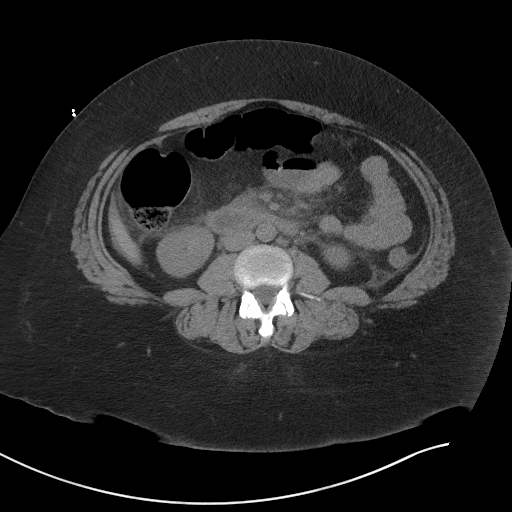
[im 62/96  soft-tissue]
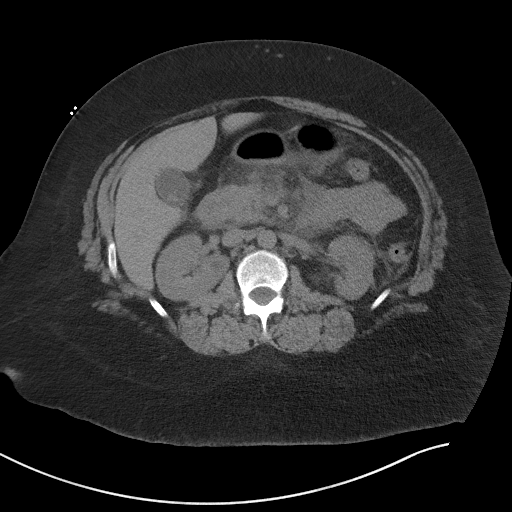
[im 62/96  bone]
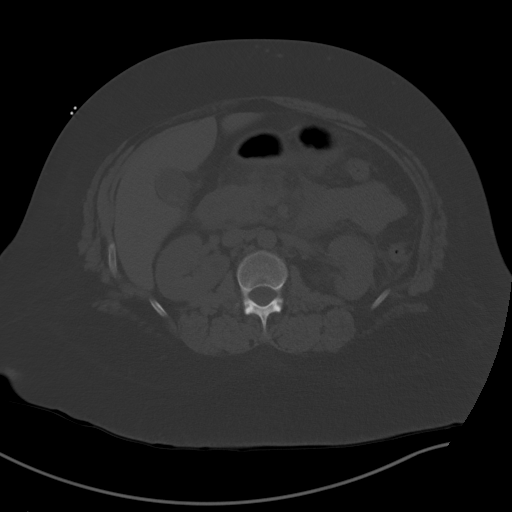
[im 67/96  soft-tissue]
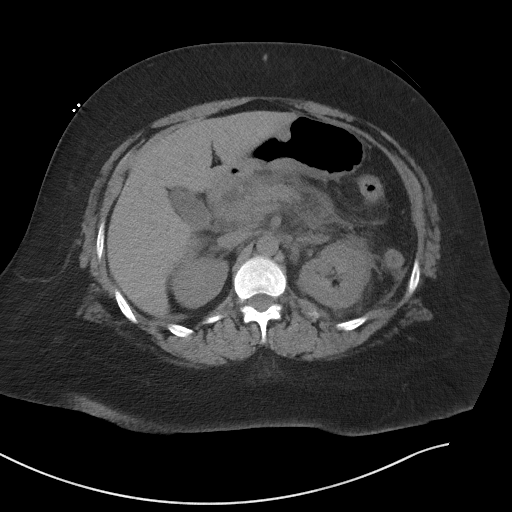
[im 77/96  soft-tissue]
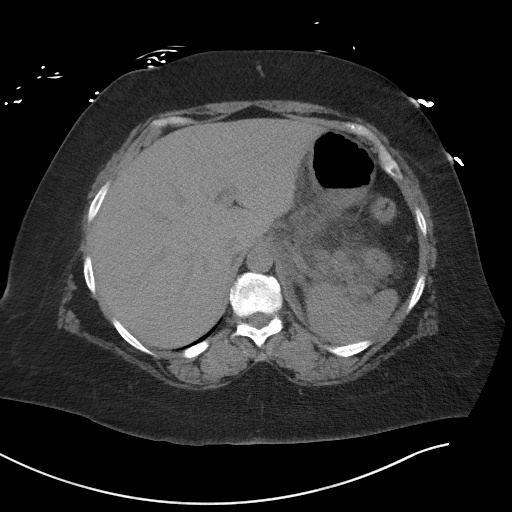
[im 81/96  soft-tissue]
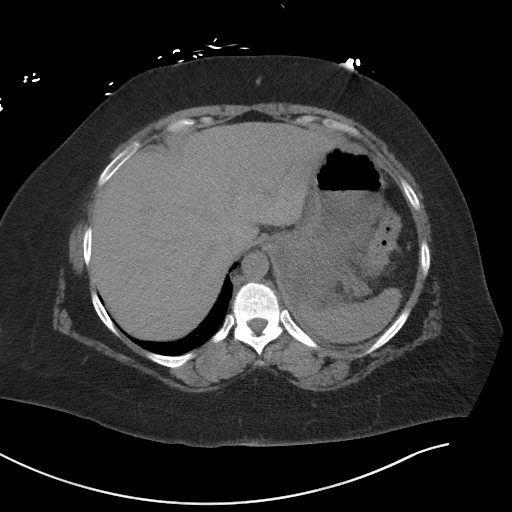
[im 91/96  soft-tissue]
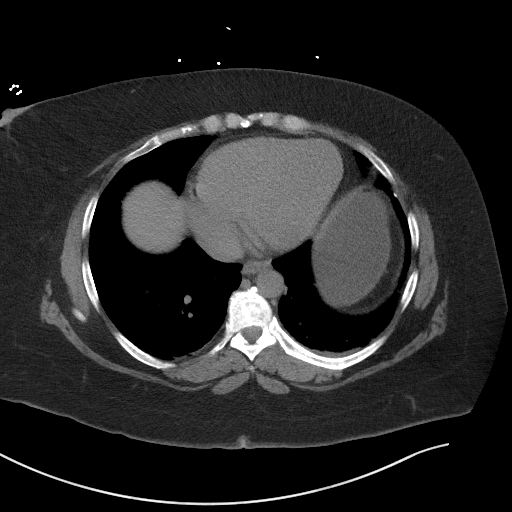

[Series 5: coronal st · coronal · 0.89mm/px · 3 of 133 slices shown]
[im 45/133  soft-tissue]
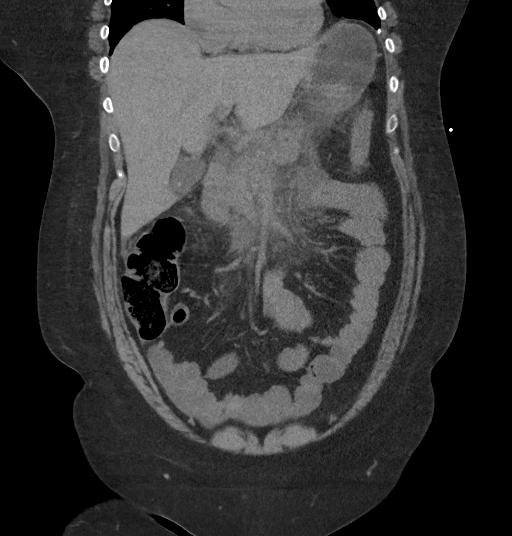
[im 59/133  soft-tissue]
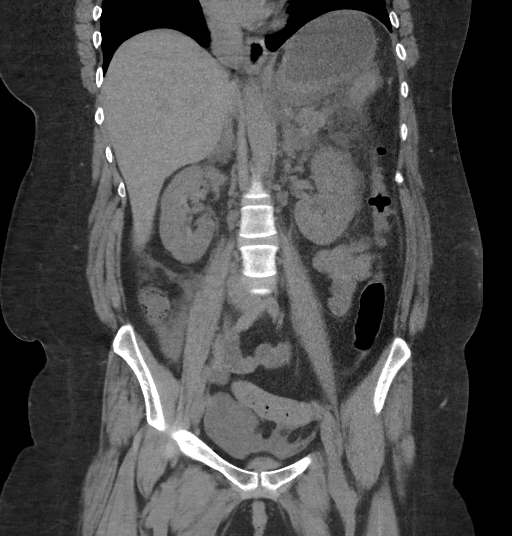
[im 74/133  soft-tissue]
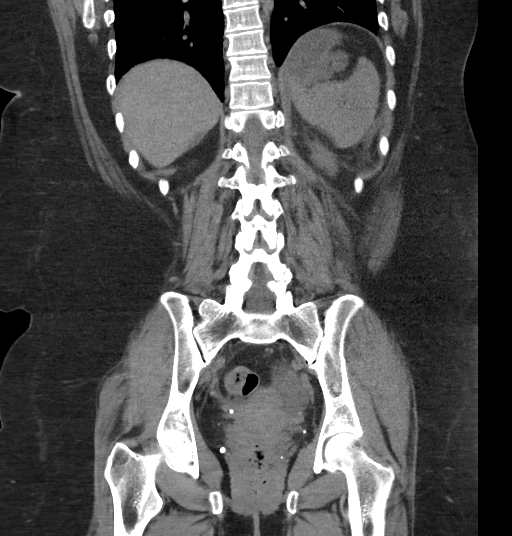

[16 of 46 positions shown; findings below may reference images not displayed]

FINDINGS: Lower chest: Minimal bibasilar atelectasis is seen. No focal
confluent infiltrate is noted.

Hepatobiliary: Dependent densities are seen within the gallbladder
consistent with cholelithiasis. The liver is within normal limits.

Pancreas: Pancreas demonstrates significant peripancreatic
inflammatory changes surrounding the entire pancreas. A focal fluid
collection is noted which measures approximately 3.7 cm adjacent to
the midportion of the pancreas which may represent early changes of
pancreatic pseudocyst within the phlegmon. Phlegmonous changes
extend along the anterior aspect of Gerota's fascia bilaterally as
well as along the right pericolic gutter. They extend superiorly
towards the stomach and left colon as well.

Spleen: Normal in size without focal abnormality.

Adrenals/Urinary Tract: Adrenal glands are within normal limits.
Kidneys demonstrate no renal calculi or obstructive changes. Bladder
is decompressed.

Stomach/Bowel: No obstructive changes of the colon are noted. The
appendix is within normal limits. Small bowel is minimally prominent
although no true obstructive changes are seen. These changes are
likely reactive to the pancreatic inflammatory change. Stomach is
well distended with ingested food stuffs.

Vascular/Lymphatic: No significant vascular findings are present. No
enlarged abdominal or pelvic lymph nodes.

Reproductive: Uterus is within normal limits. There is an ovarian
dermoid seen on the left which measures approximately 3.2 cm. Right
adnexa appears within normal limits.

Other: Free fluid is noted within the pelvis related to the
pancreatic inflammatory change.

Musculoskeletal: No acute or significant osseous findings.
IMPRESSION: Changes consistent with acute pancreatitis. A focal fluid collection
is noted adjacent to the midportion of the pancreatic body which may
represent early changes of pancreatic pseudocyst within the
phlegmon.

Cholelithiasis without complicating factors.

3.2 cm left ovarian dermoid. This was not present on the prior exam
of 5630. Nonemergent enhanced MRI is recommended for further
evaluation. This will allow for optimum imaging.
